# Patient Record
Sex: Female | Born: 1950 | Race: White | Hispanic: No | Marital: Married | Smoking: Never smoker
Health system: Southern US, Community
[De-identification: ages and names within clinical notes are randomized; demographics above are authoritative.]

## PROBLEM LIST (undated history)

## (undated) DIAGNOSIS — Z923 Personal history of irradiation: Secondary | ICD-10-CM

## (undated) DIAGNOSIS — M1990 Unspecified osteoarthritis, unspecified site: Secondary | ICD-10-CM

## (undated) DIAGNOSIS — Z8051 Family history of malignant neoplasm of kidney: Secondary | ICD-10-CM

## (undated) DIAGNOSIS — C50919 Malignant neoplasm of unspecified site of unspecified female breast: Secondary | ICD-10-CM

## (undated) DIAGNOSIS — K429 Umbilical hernia without obstruction or gangrene: Secondary | ICD-10-CM

## (undated) DIAGNOSIS — R112 Nausea with vomiting, unspecified: Secondary | ICD-10-CM

## (undated) DIAGNOSIS — R000 Tachycardia, unspecified: Secondary | ICD-10-CM

## (undated) DIAGNOSIS — R0600 Dyspnea, unspecified: Secondary | ICD-10-CM

## (undated) DIAGNOSIS — Z973 Presence of spectacles and contact lenses: Secondary | ICD-10-CM

## (undated) DIAGNOSIS — G4730 Sleep apnea, unspecified: Secondary | ICD-10-CM

## (undated) DIAGNOSIS — Z803 Family history of malignant neoplasm of breast: Secondary | ICD-10-CM

## (undated) DIAGNOSIS — R569 Unspecified convulsions: Secondary | ICD-10-CM

## (undated) DIAGNOSIS — M549 Dorsalgia, unspecified: Secondary | ICD-10-CM

## (undated) DIAGNOSIS — Z808 Family history of malignant neoplasm of other organs or systems: Secondary | ICD-10-CM

## (undated) DIAGNOSIS — Z9889 Other specified postprocedural states: Secondary | ICD-10-CM

## (undated) DIAGNOSIS — R002 Palpitations: Secondary | ICD-10-CM

## (undated) HISTORY — PX: BREAST EXCISIONAL BIOPSY: SUR124

## (undated) HISTORY — DX: Malignant neoplasm of unspecified site of unspecified female breast: C50.919

## (undated) HISTORY — PX: CHOLECYSTECTOMY: SHX55

## (undated) HISTORY — DX: Family history of malignant neoplasm of breast: Z80.3

## (undated) HISTORY — PX: UPPER GI ENDOSCOPY: SHX6162

## (undated) HISTORY — PX: BRAIN SURGERY: SHX531

## (undated) HISTORY — DX: Family history of malignant neoplasm of other organs or systems: Z80.8

## (undated) HISTORY — PX: COLONOSCOPY: SHX174

## (undated) HISTORY — DX: Family history of malignant neoplasm of kidney: Z80.51

---

## 1989-01-03 HISTORY — PX: ABDOMINAL HYSTERECTOMY: SHX81

## 2001-12-25 ENCOUNTER — Ambulatory Visit (HOSPITAL_COMMUNITY): Admission: RE | Admit: 2001-12-25 | Discharge: 2001-12-25 | Payer: Self-pay | Admitting: Family Medicine

## 2001-12-25 ENCOUNTER — Encounter: Payer: Self-pay | Admitting: Family Medicine

## 2003-03-30 ENCOUNTER — Emergency Department (HOSPITAL_COMMUNITY): Admission: AD | Admit: 2003-03-30 | Discharge: 2003-03-31 | Payer: Self-pay | Admitting: Emergency Medicine

## 2003-05-08 ENCOUNTER — Other Ambulatory Visit: Admission: RE | Admit: 2003-05-08 | Discharge: 2003-05-08 | Payer: Self-pay | Admitting: Gynecology

## 2004-03-02 ENCOUNTER — Ambulatory Visit (HOSPITAL_COMMUNITY): Admission: RE | Admit: 2004-03-02 | Discharge: 2004-03-02 | Payer: Self-pay | Admitting: Family Medicine

## 2004-03-11 ENCOUNTER — Encounter: Admission: RE | Admit: 2004-03-11 | Discharge: 2004-03-11 | Payer: Self-pay

## 2004-03-16 ENCOUNTER — Observation Stay (HOSPITAL_COMMUNITY): Admission: RE | Admit: 2004-03-16 | Discharge: 2004-03-17 | Payer: Self-pay

## 2004-05-21 ENCOUNTER — Other Ambulatory Visit: Admission: RE | Admit: 2004-05-21 | Discharge: 2004-05-21 | Payer: Self-pay | Admitting: Gynecology

## 2004-09-13 ENCOUNTER — Encounter: Admission: RE | Admit: 2004-09-13 | Discharge: 2004-09-13 | Payer: Self-pay

## 2011-01-04 HISTORY — PX: BREAST SURGERY: SHX581

## 2011-09-30 ENCOUNTER — Other Ambulatory Visit: Payer: Self-pay | Admitting: Family Medicine

## 2011-09-30 DIAGNOSIS — E010 Iodine-deficiency related diffuse (endemic) goiter: Secondary | ICD-10-CM

## 2011-10-03 ENCOUNTER — Ambulatory Visit
Admission: RE | Admit: 2011-10-03 | Discharge: 2011-10-03 | Disposition: A | Discharge status: Home / Self Care | Payer: BC Managed Care – PPO | Source: Ambulatory Visit | Attending: Family Medicine | Admitting: Family Medicine

## 2011-10-03 ENCOUNTER — Ambulatory Visit
Admission: RE | Admit: 2011-10-03 | Discharge: 2011-10-03 | Disposition: A | Discharge status: Home / Self Care | Payer: BC Managed Care – PPO | Source: Ambulatory Visit | Attending: Gynecology | Admitting: Gynecology

## 2011-10-03 ENCOUNTER — Other Ambulatory Visit: Payer: Self-pay | Admitting: Gynecology

## 2011-10-03 DIAGNOSIS — Z1231 Encounter for screening mammogram for malignant neoplasm of breast: Secondary | ICD-10-CM

## 2011-10-03 DIAGNOSIS — E010 Iodine-deficiency related diffuse (endemic) goiter: Secondary | ICD-10-CM

## 2011-10-05 ENCOUNTER — Other Ambulatory Visit: Payer: Self-pay | Admitting: Gynecology

## 2011-10-05 DIAGNOSIS — R928 Other abnormal and inconclusive findings on diagnostic imaging of breast: Secondary | ICD-10-CM

## 2011-10-10 ENCOUNTER — Ambulatory Visit
Admission: RE | Admit: 2011-10-10 | Discharge: 2011-10-10 | Disposition: A | Discharge status: Home / Self Care | Payer: BC Managed Care – PPO | Source: Ambulatory Visit | Attending: Gynecology | Admitting: Gynecology

## 2011-10-10 ENCOUNTER — Other Ambulatory Visit: Payer: Self-pay | Admitting: Gynecology

## 2011-10-10 DIAGNOSIS — R928 Other abnormal and inconclusive findings on diagnostic imaging of breast: Secondary | ICD-10-CM

## 2011-10-17 ENCOUNTER — Ambulatory Visit
Admission: RE | Admit: 2011-10-17 | Discharge: 2011-10-17 | Disposition: A | Discharge status: Home / Self Care | Payer: BC Managed Care – PPO | Source: Ambulatory Visit | Attending: Gynecology | Admitting: Gynecology

## 2011-10-17 ENCOUNTER — Other Ambulatory Visit: Payer: Self-pay | Admitting: Gynecology

## 2011-10-17 ENCOUNTER — Other Ambulatory Visit: Payer: Self-pay | Admitting: Diagnostic Radiology

## 2011-10-17 DIAGNOSIS — R928 Other abnormal and inconclusive findings on diagnostic imaging of breast: Secondary | ICD-10-CM

## 2012-08-03 ENCOUNTER — Other Ambulatory Visit: Payer: Self-pay | Admitting: Family Medicine

## 2012-08-03 DIAGNOSIS — M5431 Sciatica, right side: Secondary | ICD-10-CM

## 2012-08-06 ENCOUNTER — Other Ambulatory Visit: Payer: Self-pay | Admitting: Family Medicine

## 2012-08-06 DIAGNOSIS — R921 Mammographic calcification found on diagnostic imaging of breast: Secondary | ICD-10-CM

## 2012-08-07 ENCOUNTER — Ambulatory Visit
Admission: RE | Admit: 2012-08-07 | Discharge: 2012-08-07 | Disposition: A | Discharge status: Home / Self Care | Payer: BC Managed Care – PPO | Source: Ambulatory Visit | Attending: Family Medicine | Admitting: Family Medicine

## 2012-08-07 DIAGNOSIS — M5431 Sciatica, right side: Secondary | ICD-10-CM

## 2012-08-20 ENCOUNTER — Ambulatory Visit
Admission: RE | Admit: 2012-08-20 | Discharge: 2012-08-20 | Disposition: A | Discharge status: Home / Self Care | Payer: BC Managed Care – PPO | Source: Ambulatory Visit | Attending: Family Medicine | Admitting: Family Medicine

## 2012-08-20 ENCOUNTER — Other Ambulatory Visit: Payer: Self-pay | Admitting: Family Medicine

## 2012-08-20 ENCOUNTER — Other Ambulatory Visit: Payer: Self-pay | Admitting: Neurosurgery

## 2012-08-20 DIAGNOSIS — R921 Mammographic calcification found on diagnostic imaging of breast: Secondary | ICD-10-CM

## 2012-08-22 ENCOUNTER — Encounter (HOSPITAL_COMMUNITY): Payer: Self-pay | Admitting: Pharmacy Technician

## 2012-08-23 ENCOUNTER — Encounter (HOSPITAL_COMMUNITY): Payer: Self-pay

## 2012-08-23 ENCOUNTER — Encounter (HOSPITAL_COMMUNITY)
Admission: RE | Admit: 2012-08-23 | Discharge: 2012-08-23 | Disposition: A | Discharge status: Home / Self Care | Payer: BC Managed Care – PPO | Source: Ambulatory Visit | Attending: Neurosurgery | Admitting: Neurosurgery

## 2012-08-23 DIAGNOSIS — Z01812 Encounter for preprocedural laboratory examination: Secondary | ICD-10-CM | POA: Insufficient documentation

## 2012-08-23 HISTORY — DX: Unspecified convulsions: R56.9

## 2012-08-23 LAB — BASIC METABOLIC PANEL
BUN: 12 mg/dL (ref 6–23)
Calcium: 9.4 mg/dL (ref 8.4–10.5)
Chloride: 105 mEq/L (ref 96–112)
Creatinine, Ser: 0.8 mg/dL (ref 0.50–1.10)
GFR calc Af Amer: 88 mL/min — ABNORMAL LOW (ref >90)

## 2012-08-23 LAB — SURGICAL PCR SCREEN: Staphylococcus aureus: NEGATIVE

## 2012-08-23 LAB — CBC
HCT: 39.1 % (ref 36.0–46.0)
MCHC: 35 g/dL (ref 30.0–36.0)
MCV: 90.3 fL (ref 78.0–100.0)
RDW: 13 % (ref 11.5–15.5)

## 2012-08-23 NOTE — Pre-Procedure Instructions (Signed)
CAITLYNNE HARBECK  08/23/2012   Your procedure is scheduled on:  August 27, 2012  Report to [facility] at 5:30 AM.  Call this number if you have problems the morning of surgery: [phone]   Remember:   Do not eat food or drink liquids after midnight.   Take these medicines the morning of surgery with A SIP OF WATER: cyclobenzaprine (FLEXERIL) 10 MG tablet    Do not wear jewelry, make-up or nail polish.  Do not wear lotions, powders, or perfumes. You may wear deodorant.  Do not shave 48 hours prior to surgery. Men may shave face and neck.  Do not bring valuables to the hospital.  [facility] is not responsible for any belongings or valuables.  Contacts, dentures or bridgework may not be worn into surgery.  Leave suitcase in the car. After surgery it may be brought to your room.  For patients admitted to the hospital, checkout time is 11:00 AM the day of  discharge.   Patients discharged the day of surgery will not be allowed to drive  home.  Name and phone number of your driver:   Special Instructions: Shower using CHG 2 nights before surgery and the night before surgery.  If you shower the day of surgery use CHG.  Use special wash - you have one bottle of CHG for all showers.  You should use approximately 1/3 of the bottle for each shower.   Please read over the following fact sheets that you were given: Pain Booklet, Coughing and Deep Breathing and Surgical Site Infection Prevention

## 2012-08-26 MED ORDER — CEFAZOLIN SODIUM-DEXTROSE 2-3 GM-% IV SOLR
2.0000 g | INTRAVENOUS | Status: AC
  Administered 2012-08-27: 2 g via INTRAVENOUS
  Filled 2012-08-26: qty 50

## 2012-08-26 MED ORDER — DEXAMETHASONE SODIUM PHOSPHATE 10 MG/ML IJ SOLN
10.0000 mg | INTRAMUSCULAR | Status: AC
  Administered 2012-08-27: 10 mg via INTRAVENOUS
  Filled 2012-08-26: qty 1

## 2012-08-27 ENCOUNTER — Encounter (HOSPITAL_COMMUNITY): Payer: Self-pay | Admitting: *Deleted

## 2012-08-27 ENCOUNTER — Encounter (HOSPITAL_COMMUNITY): Payer: Self-pay | Admitting: Certified Registered"

## 2012-08-27 ENCOUNTER — Ambulatory Visit (HOSPITAL_COMMUNITY): Payer: BC Managed Care – PPO

## 2012-08-27 ENCOUNTER — Ambulatory Visit (HOSPITAL_COMMUNITY)
Admission: RE | Admit: 2012-08-27 | Discharge: 2012-08-27 | Disposition: A | Discharge status: Home / Self Care | Payer: BC Managed Care – PPO | Source: Ambulatory Visit | Attending: Neurosurgery | Admitting: Neurosurgery

## 2012-08-27 ENCOUNTER — Encounter (HOSPITAL_COMMUNITY)
Admission: RE | Disposition: A | Discharge status: Home / Self Care | Payer: Self-pay | Source: Ambulatory Visit | Attending: Neurosurgery

## 2012-08-27 ENCOUNTER — Ambulatory Visit (HOSPITAL_COMMUNITY): Payer: BC Managed Care – PPO | Admitting: Certified Registered"

## 2012-08-27 DIAGNOSIS — G5790 Unspecified mononeuropathy of unspecified lower limb: Secondary | ICD-10-CM | POA: Insufficient documentation

## 2012-08-27 DIAGNOSIS — M5126 Other intervertebral disc displacement, lumbar region: Secondary | ICD-10-CM | POA: Insufficient documentation

## 2012-08-27 HISTORY — PX: LUMBAR LAMINECTOMY/DECOMPRESSION MICRODISCECTOMY: SHX5026

## 2012-08-27 SURGERY — LUMBAR LAMINECTOMY/DECOMPRESSION MICRODISCECTOMY 1 LEVEL
Anesthesia: General | Site: Back | Laterality: Right | Wound class: Clean

## 2012-08-27 MED ORDER — ALUM & MAG HYDROXIDE-SIMETH 200-200-20 MG/5ML PO SUSP: 30.0000 mL | Freq: Four times a day (QID) | ORAL | Status: DC | PRN

## 2012-08-27 MED ORDER — ONDANSETRON HCL 4 MG/2ML IJ SOLN
INTRAMUSCULAR | Status: DC | PRN
  Administered 2012-08-27: 4 mg via INTRAVENOUS

## 2012-08-27 MED ORDER — HYDROMORPHONE HCL PF 1 MG/ML IJ SOLN: 0.2500 mg | INTRAMUSCULAR | Status: DC | PRN

## 2012-08-27 MED ORDER — NEOSTIGMINE METHYLSULFATE 1 MG/ML IJ SOLN
INTRAMUSCULAR | Status: DC | PRN
  Administered 2012-08-27: 3 mg via INTRAVENOUS

## 2012-08-27 MED ORDER — DOCUSATE SODIUM 100 MG PO CAPS: 100.0000 mg | ORAL_CAPSULE | Freq: Two times a day (BID) | ORAL | Status: DC

## 2012-08-27 MED ORDER — EPHEDRINE SULFATE 50 MG/ML IJ SOLN
INTRAMUSCULAR | Status: DC | PRN
  Administered 2012-08-27: 10 mg via INTRAVENOUS
  Administered 2012-08-27: 5 mg via INTRAVENOUS

## 2012-08-27 MED ORDER — LIDOCAINE-EPINEPHRINE 1 %-1:100000 IJ SOLN
INTRAMUSCULAR | Status: DC | PRN
  Administered 2012-08-27: 5 mL

## 2012-08-27 MED ORDER — OXYCODONE-ACETAMINOPHEN 5-325 MG PO TABS: 1.0000 | ORAL_TABLET | ORAL | Status: DC | PRN

## 2012-08-27 MED ORDER — SODIUM CHLORIDE 0.9 % IJ SOLN: 3.0000 mL | INTRAMUSCULAR | Status: DC | PRN

## 2012-08-27 MED ORDER — ONDANSETRON HCL 4 MG/2ML IJ SOLN: 4.0000 mg | INTRAMUSCULAR | Status: DC | PRN

## 2012-08-27 MED ORDER — CYCLOBENZAPRINE HCL 10 MG PO TABS: 10.0000 mg | ORAL_TABLET | Freq: Three times a day (TID) | ORAL | Status: DC | PRN

## 2012-08-27 MED ORDER — THROMBIN 5000 UNITS EX SOLR
CUTANEOUS | Status: DC | PRN
  Administered 2012-08-27 (×2): 5000 [IU] via TOPICAL

## 2012-08-27 MED ORDER — SODIUM CHLORIDE 0.9 % IR SOLN
Status: DC | PRN
  Administered 2012-08-27: 08:00:00

## 2012-08-27 MED ORDER — LACTATED RINGERS IV SOLN
INTRAVENOUS | Status: DC | PRN
  Administered 2012-08-27 (×2): via INTRAVENOUS

## 2012-08-27 MED ORDER — HYDROMORPHONE HCL PF 1 MG/ML IJ SOLN: 0.5000 mg | INTRAMUSCULAR | Status: DC | PRN

## 2012-08-27 MED ORDER — GLYCOPYRROLATE 0.2 MG/ML IJ SOLN
INTRAMUSCULAR | Status: DC | PRN
  Administered 2012-08-27: 0.4 mg via INTRAVENOUS

## 2012-08-27 MED ORDER — SODIUM CHLORIDE 0.9 % IV SOLN: 250.0000 mL | INTRAVENOUS | Status: DC

## 2012-08-27 MED ORDER — HEMOSTATIC AGENTS (NO CHARGE) OPTIME
TOPICAL | Status: DC | PRN
  Administered 2012-08-27: 1 via TOPICAL

## 2012-08-27 MED ORDER — BUPIVACAINE HCL (PF) 0.25 % IJ SOLN
INTRAMUSCULAR | Status: DC | PRN
  Administered 2012-08-27: 5 mL

## 2012-08-27 MED ORDER — SUFENTANIL CITRATE 50 MCG/ML IV SOLN
INTRAVENOUS | Status: DC | PRN
  Administered 2012-08-27 (×2): 5 ug via INTRAVENOUS
  Administered 2012-08-27: 20 ug via INTRAVENOUS

## 2012-08-27 MED ORDER — ACETAMINOPHEN 325 MG PO TABS: 650.0000 mg | ORAL_TABLET | ORAL | Status: DC | PRN

## 2012-08-27 MED ORDER — MENTHOL 3 MG MT LOZG: 1.0000 | LOZENGE | OROMUCOSAL | Status: DC | PRN

## 2012-08-27 MED ORDER — LIDOCAINE HCL (CARDIAC) 20 MG/ML IV SOLN
INTRAVENOUS | Status: DC | PRN
  Administered 2012-08-27: 100 mg via INTRAVENOUS

## 2012-08-27 MED ORDER — PHENYLEPHRINE HCL 10 MG/ML IJ SOLN
INTRAMUSCULAR | Status: DC | PRN
  Administered 2012-08-27: 80 ug via INTRAVENOUS
  Administered 2012-08-27: 40 ug via INTRAVENOUS
  Administered 2012-08-27: 80 ug via INTRAVENOUS
  Administered 2012-08-27: 40 ug via INTRAVENOUS

## 2012-08-27 MED ORDER — PHENOL 1.4 % MT LIQD: 1.0000 | OROMUCOSAL | Status: DC | PRN

## 2012-08-27 MED ORDER — TRAMADOL HCL 50 MG PO TABS: 50.0000 mg | ORAL_TABLET | Freq: Four times a day (QID) | ORAL | Status: DC | PRN

## 2012-08-27 MED ORDER — ACETAMINOPHEN 650 MG RE SUPP: 650.0000 mg | RECTAL | Status: DC | PRN

## 2012-08-27 MED ORDER — ROCURONIUM BROMIDE 100 MG/10ML IV SOLN
INTRAVENOUS | Status: DC | PRN
  Administered 2012-08-27: 5 mg via INTRAVENOUS
  Administered 2012-08-27: 40 mg via INTRAVENOUS

## 2012-08-27 MED ORDER — MIDAZOLAM HCL 5 MG/5ML IJ SOLN
INTRAMUSCULAR | Status: DC | PRN
  Administered 2012-08-27: 2 mg via INTRAVENOUS

## 2012-08-27 MED ORDER — PROMETHAZINE HCL 25 MG/ML IJ SOLN: 6.2500 mg | INTRAMUSCULAR | Status: DC | PRN

## 2012-08-27 MED ORDER — SODIUM CHLORIDE 0.9 % IJ SOLN: 3.0000 mL | Freq: Two times a day (BID) | INTRAMUSCULAR | Status: DC

## 2012-08-27 MED ORDER — CEFAZOLIN SODIUM 1-5 GM-% IV SOLN
1.0000 g | Freq: Three times a day (TID) | INTRAVENOUS | Status: DC
  Administered 2012-08-27: 1 g via INTRAVENOUS
  Filled 2012-08-27 (×2): qty 50

## 2012-08-27 MED ORDER — PROPOFOL 10 MG/ML IV BOLUS
INTRAVENOUS | Status: DC | PRN
  Administered 2012-08-27: 40 mg via INTRAVENOUS
  Administered 2012-08-27: 160 mg via INTRAVENOUS

## 2012-08-27 MED ORDER — ARTIFICIAL TEARS OP OINT
TOPICAL_OINTMENT | OPHTHALMIC | Status: DC | PRN
  Administered 2012-08-27: 1 via OPHTHALMIC

## 2012-08-27 SURGICAL SUPPLY — 66 items
ADH SKN CLS APL DERMABOND .7 (GAUZE/BANDAGES/DRESSINGS) ×1
ADH SKN CLS LQ APL DERMABOND (GAUZE/BANDAGES/DRESSINGS) ×1
APL SKNCLS STERI-STRIP NONHPOA (GAUZE/BANDAGES/DRESSINGS) ×1
BAG DECANTER FOR FLEXI CONT (MISCELLANEOUS) ×2 IMPLANT
BENZOIN TINCTURE PRP APPL 2/3 (GAUZE/BANDAGES/DRESSINGS) ×2 IMPLANT
BLADE SURG 11 STRL SS (BLADE) ×2 IMPLANT
BLADE SURG ROTATE 9660 (MISCELLANEOUS) IMPLANT
BRUSH SCRUB EZ PLAIN DRY (MISCELLANEOUS) ×2 IMPLANT
BUR MATCHSTICK NEURO 3.0 LAGG (BURR) ×2 IMPLANT
BUR PRECISION FLUTE 6.0 (BURR) ×2 IMPLANT
CANISTER SUCTION 2500CC (MISCELLANEOUS) ×2 IMPLANT
CLOTH BEACON ORANGE TIMEOUT ST (SAFETY) ×2 IMPLANT
CONT SPEC 4OZ CLIKSEAL STRL BL (MISCELLANEOUS) ×2 IMPLANT
DECANTER SPIKE VIAL GLASS SM (MISCELLANEOUS) ×2 IMPLANT
DERMABOND ADHESIVE PROPEN (GAUZE/BANDAGES/DRESSINGS) ×1
DERMABOND ADVANCED (GAUZE/BANDAGES/DRESSINGS) ×1
DERMABOND ADVANCED .7 DNX12 (GAUZE/BANDAGES/DRESSINGS) ×1 IMPLANT
DERMABOND ADVANCED .7 DNX6 (GAUZE/BANDAGES/DRESSINGS) IMPLANT
DRAPE LAPAROTOMY 100X72X124 (DRAPES) ×2 IMPLANT
DRAPE MICROSCOPE ZEISS OPMI (DRAPES) ×2 IMPLANT
DRAPE POUCH INSTRU U-SHP 10X18 (DRAPES) ×2 IMPLANT
DRAPE PROXIMA HALF (DRAPES) ×1 IMPLANT
DRAPE SURG 17X23 STRL (DRAPES) ×2 IMPLANT
DRSG OPSITE 4X5.5 SM (GAUZE/BANDAGES/DRESSINGS) ×2 IMPLANT
DURAPREP 26ML APPLICATOR (WOUND CARE) ×2 IMPLANT
ELECT BLADE 4.0 EZ CLEAN MEGAD (MISCELLANEOUS) ×2
ELECT REM PT RETURN 9FT ADLT (ELECTROSURGICAL) ×2
ELECTRODE BLDE 4.0 EZ CLN MEGD (MISCELLANEOUS) IMPLANT
ELECTRODE REM PT RTRN 9FT ADLT (ELECTROSURGICAL) ×1 IMPLANT
GAUZE SPONGE 4X4 16PLY XRAY LF (GAUZE/BANDAGES/DRESSINGS) IMPLANT
GLOVE BIO SURGEON STRL SZ8 (GLOVE) ×2 IMPLANT
GLOVE BIOGEL PI IND STRL 7.0 (GLOVE) IMPLANT
GLOVE BIOGEL PI IND STRL 8 (GLOVE) IMPLANT
GLOVE BIOGEL PI INDICATOR 7.0 (GLOVE) ×1
GLOVE BIOGEL PI INDICATOR 8 (GLOVE) ×1
GLOVE ECLIPSE 7.5 STRL STRAW (GLOVE) ×1 IMPLANT
GLOVE EXAM NITRILE LRG STRL (GLOVE) ×1 IMPLANT
GLOVE EXAM NITRILE MD LF STRL (GLOVE) IMPLANT
GLOVE EXAM NITRILE XL STR (GLOVE) IMPLANT
GLOVE EXAM NITRILE XS STR PU (GLOVE) IMPLANT
GLOVE INDICATOR 8.5 STRL (GLOVE) ×2 IMPLANT
GLOVE SS BIOGEL STRL SZ 6.5 (GLOVE) IMPLANT
GLOVE SUPERSENSE BIOGEL SZ 6.5 (GLOVE) ×2
GOWN BRE IMP SLV AUR LG STRL (GOWN DISPOSABLE) ×2 IMPLANT
GOWN BRE IMP SLV AUR XL STRL (GOWN DISPOSABLE) ×4 IMPLANT
GOWN STRL REIN 2XL LVL4 (GOWN DISPOSABLE) IMPLANT
KIT BASIN OR (CUSTOM PROCEDURE TRAY) ×2 IMPLANT
KIT ROOM TURNOVER OR (KITS) ×2 IMPLANT
NDL SPNL 22GX3.5 QUINCKE BK (NEEDLE) ×1 IMPLANT
NEEDLE HYPO 22GX1.5 SAFETY (NEEDLE) ×2 IMPLANT
NEEDLE SPNL 22GX3.5 QUINCKE BK (NEEDLE) ×2 IMPLANT
NS IRRIG 1000ML POUR BTL (IV SOLUTION) ×2 IMPLANT
PACK LAMINECTOMY NEURO (CUSTOM PROCEDURE TRAY) ×2 IMPLANT
RUBBERBAND STERILE (MISCELLANEOUS) ×4 IMPLANT
SPONGE GAUZE 4X4 12PLY (GAUZE/BANDAGES/DRESSINGS) ×2 IMPLANT
SPONGE SURGIFOAM ABS GEL SZ50 (HEMOSTASIS) ×2 IMPLANT
STRIP CLOSURE SKIN 1/2X4 (GAUZE/BANDAGES/DRESSINGS) ×2 IMPLANT
SUT VIC AB 0 CT1 18XCR BRD8 (SUTURE) ×1 IMPLANT
SUT VIC AB 0 CT1 8-18 (SUTURE) ×2
SUT VIC AB 2-0 CT1 18 (SUTURE) ×2 IMPLANT
SUT VICRYL 4-0 PS2 18IN ABS (SUTURE) ×2 IMPLANT
SYR 20ML ECCENTRIC (SYRINGE) ×2 IMPLANT
TAPE CLOTH SURG 4X10 WHT LF (GAUZE/BANDAGES/DRESSINGS) ×1 IMPLANT
TOWEL OR 17X24 6PK STRL BLUE (TOWEL DISPOSABLE) ×2 IMPLANT
TOWEL OR 17X26 10 PK STRL BLUE (TOWEL DISPOSABLE) ×2 IMPLANT
WATER STERILE IRR 1000ML POUR (IV SOLUTION) ×2 IMPLANT

## 2012-08-27 NOTE — Addendum Note (Signed)
Addendum created 08/27/12 1529 by Lovie Chol, CRNA   Modules edited: Anesthesia Flowsheet

## 2012-08-27 NOTE — Anesthesia Procedure Notes (Signed)
Procedure Name: Intubation Date/Time: 08/27/2012 7:30 AM Performed by: Lovie Chol Pre-anesthesia Checklist: Patient identified, Emergency Drugs available, Suction available and Patient being monitored Patient Re-evaluated:Patient Re-evaluated prior to inductionOxygen Delivery Method: Circle system utilized Preoxygenation: Pre-oxygenation with 100% oxygen Intubation Type: IV induction Ventilation: Mask ventilation without difficulty Laryngoscope Size: Miller and 2 Grade View: Grade II Tube type: Oral Number of attempts: 1 Airway Equipment and Method: Stylet Placement Confirmation: ETT inserted through vocal cords under direct vision,  positive ETCO2,  CO2 detector and breath sounds checked- equal and bilateral Secured at: 21 cm Tube secured with: Tape Dental Injury: Teeth and Oropharynx as per pre-operative assessment

## 2012-08-27 NOTE — Discharge Summary (Signed)
  Physician Discharge Summary  Patient ID: Virginia Lindsey MRN: [id number] DOB/AGE: 1950/02/04 62 y.o.  Admit date: 08/27/2012 Discharge date: 08/27/2012  Admission Diagnoses: Herniated nucleus pulposus L4-5 extraforaminal right  Discharge Diagnoses: Same Active Problems:   * No active hospital problems. *   Discharged Condition: good  Hospital Course: Patient is been hospital underwent an external discectomy at L4-5 in the right postop patient did fairly well had some anterior quad pain but was otherwise doing well and neurologically nonfocal complete resolution her preoperative radicular symptoms was angling and voiding spontaneously was able be discharged home later on hospital day 1 the  Consults: Significant Diagnostic Studies: Treatments: Extra foraminal discectomy L4-5 right Discharge Exam: Blood pressure 139/72, pulse 97, temperature 97.6 F (36.4 C), temperature source Oral, resp. rate 16, SpO2 96.00%. Strength out of 5 wound clean and dry  Disposition: Home     Medication List         cyclobenzaprine 10 MG tablet  Commonly known as:  FLEXERIL  Take 10 mg by mouth 3 (three) times daily as needed for muscle spasms.     cyclobenzaprine 10 MG tablet  Commonly known as:  FLEXERIL  Take 1 tablet (10 mg total) by mouth 3 (three) times daily as needed for muscle spasms.     traMADol 50 MG tablet  Commonly known as:  ULTRAM  Take 1 tablet (50 mg total) by mouth every 6 (six) hours as needed for pain.           Follow-up Information   Follow up with [facility] P, MD.   Specialty:  Neurosurgery   Contact information:   [address] [place] [address] [phone]       Signed: Cailynn Bodnar P 08/27/2012, 5:20 PM

## 2012-08-27 NOTE — Transfer of Care (Signed)
Immediate Anesthesia Transfer of Care Note  Patient: Virginia Lindsey  Procedure(s) Performed: Procedure(s): RIGHT  LUMBAR FOUR FIVE LAMINECTOMY/DECOMPRESSION MICRODISCECTOMY 1 LEVEL (Right)  Patient Location: PACU  Anesthesia Type:General  Level of Consciousness: awake, alert , oriented and patient cooperative  Airway & Oxygen Therapy: Patient Spontanous Breathing and Patient connected to nasal cannula oxygen  Post-op Assessment: Report given to PACU RN and Post -op Vital signs reviewed and stable  Post vital signs: Reviewed and stable  Complications: No apparent anesthesia complications

## 2012-08-27 NOTE — Progress Notes (Signed)
Pt given D/C instructions with Rx, verbal understanding given. Pt D/C'd home via wheelchair @ 1845 per MD order. Rema Fendt, RN

## 2012-08-27 NOTE — Anesthesia Preprocedure Evaluation (Addendum)
Anesthesia Evaluation  Patient identified by MRN, date of birth, ID band Patient awake    Reviewed: Allergy & Precautions, H&P , NPO status , Patient's Chart, lab work & pertinent test results  History of Anesthesia Complications Negative for: history of anesthetic complications  Airway Mallampati: II TM Distance: >3 FB Neck ROM: Full    Dental  (+) Teeth Intact, Caps, Dental Advisory Given and Chipped,    Pulmonary neg pulmonary ROS,  breath sounds clear to auscultation  Pulmonary exam normal       Cardiovascular negative cardio ROS  Rhythm:Regular Rate:Normal     Neuro/Psych Seizures -, Well Controlled,  negative psych ROS   GI/Hepatic negative GI ROS, Neg liver ROS,   Endo/Other  negative endocrine ROS  Renal/GU negative Renal ROS  negative genitourinary   Musculoskeletal   Abdominal   Peds  Hematology negative hematology ROS (+)   Anesthesia Other Findings   Reproductive/Obstetrics negative OB ROS                         Anesthesia Physical Anesthesia Plan  ASA: II  Anesthesia Plan: General   Post-op Pain Management:    Induction: Intravenous  Airway Management Planned: Oral ETT  Additional Equipment:   Intra-op Plan:   Post-operative Plan: Extubation in OR  Informed Consent: I have reviewed the patients History and Physical, chart, labs and discussed the procedure including the risks, benefits and alternatives for the proposed anesthesia with the patient or authorized representative who has indicated his/her understanding and acceptance.   Dental advisory given  Plan Discussed with: CRNA, Anesthesiologist and Surgeon  Anesthesia Plan Comments:        Anesthesia Quick Evaluation

## 2012-08-27 NOTE — Anesthesia Postprocedure Evaluation (Signed)
Anesthesia Post Note  Patient: Virginia Lindsey  Procedure(s) Performed: Procedure(s) (LRB): RIGHT  LUMBAR FOUR FIVE LAMINECTOMY/DECOMPRESSION MICRODISCECTOMY 1 LEVEL (Right)  Anesthesia type: general  Patient location: PACU  Post pain: Pain level controlled  Post assessment: Patient's Cardiovascular Status Stable  Last Vitals:  Filed Vitals:   08/27/12 0944  BP:   Pulse: 53  Temp: 36.4 C  Resp: 13    Post vital signs: Reviewed and stable  Level of consciousness: sedated  Complications: No apparent anesthesia complications

## 2012-08-27 NOTE — H&P (Signed)
Virginia Lindsey is an 62 y.o. female.   Chief Complaint: Back and right leg pain HPI: Patient is a very pleasant 64-3 months back and right leg pain that had been getting progressively worse over last weeks and months she also developed weakness on dorsiflexion of her right foot difficulty walking on her heels. Workup with MRI scan showed a large disc herniation extraforaminal he on the right at L4-5 and due to patient's dorsiflexion weakness partial footdrop progression of clinical syndrome and imaging findings I recommended a extraforaminal discectomy at L4-5 on the right. As of late her pain is starting get some better but the weakness has persisted.  Past Medical History  Diagnosis Date  . Seizures     last seizure 2004  3 total 10 yrs apart    Past Surgical History  Procedure Laterality Date  . Cholecystectomy    . Abdominal hysterectomy      partial hysterectomy    History reviewed. No pertinent family history. Social History:  reports that she has never smoked. She does not have any smokeless tobacco history on file. She reports that she does not drink alcohol or use illicit drugs.  Allergies:  Allergies  Allergen Reactions  . Hydrocodone Nausea And Vomiting    Also lightheaded and disoriented  . Codeine Nausea And Vomiting    Medications Prior to Admission  Medication Sig Dispense Refill  . cyclobenzaprine (FLEXERIL) 10 MG tablet Take 10 mg by mouth 3 (three) times daily as needed for muscle spasms.        No results found for this or any previous visit (from the past 48 hour(s)). No results found.  Review of Systems  Constitutional: Negative.   HENT: Negative.   Eyes: Negative.   Respiratory: Negative.   Cardiovascular: Negative.   Gastrointestinal: Negative.   Genitourinary: Negative.   Musculoskeletal: Positive for myalgias and back pain.  Skin: Negative.   Neurological: Positive for tingling and sensory change.  Psychiatric/Behavioral: Negative.      Blood pressure 174/85, pulse 78, temperature 98.7 F (37.1 C), temperature source Oral, resp. rate 18, SpO2 97.00%. Physical Exam  Constitutional: She is oriented to person, place, and time. She appears well-developed and well-nourished.  HENT:  Head: Normocephalic.  Eyes: Pupils are equal, round, and reactive to light.  Neck: Normal range of motion. Neck supple.  Respiratory: Effort normal.  GI: Soft.  Neurological: She is alert and oriented to person, place, and time. She has normal strength. GCS eye subscore is 4. GCS verbal subscore is 5. GCS motor subscore is 6.  Reflex Scores:      Patellar reflexes are 0 on the right side and 0 on the left side.      Achilles reflexes are 0 on the right side and 0 on the left side. Left lower extremity has 5 out of 5 strength in her iliopsoas, quads, hamstrings, gastrocs, anterior tibialis, and EHL. Right lower extremity has some weakness in dorsiflexion at about 4-4+ out of 5 otherwise she is 5 out of 5  Skin: Skin is warm.     Assessment/Plan 62 yo female presents for an extraforaminal discectomy on the right L4-5  Virginia Lindsey P 08/27/2012, 7:19 AM

## 2012-08-27 NOTE — Op Note (Signed)
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  EBL: Minimal  History of present illness: Patient is a very pleasant 62 year old female has had back and right leg pain rating down out of 5 her shin with intermittent numbness and weakness in dorsiflexion of her right foot. Workup revealed a large disc herniation in the extra foraminal space causing severe compression of the right L4 nerve root and due to patient's clinical exam consistent with dorsiflexion weakness and failure conservative treatment and as well as imaging findings I recommended extra foraminal discectomy at L4-5 in the right I extensively went over the risks and benefits of the operation with her as well as perioperative course and expectations of outcome alternatives surgery and she understood and agreed to proceed forward.  Operative procedure: Patient brought into the or was induced under general anesthesia positioned prone the Wilson frame her back was prepped and draped in routine sterile fashion. Preoperative to localize the L4 pedicle and the lateral aspect of the pars intra-or facet the three fourths several fibers to allow a high-speed drill and the microscope illumination the extradural space was identified and the markedly spondylitic from marked facet arthropathy and a 34 facet extending downward below the L4 pedicle. This was all drilled away and removed piecemeal fashion with a 2 mm Kerrison punch. Under MAC supination intertransverse ligament was divided and removed in piecemeal fashion exposing the L4 nerve root. The L4 foramen was opened up working underneath and inferior the L4 nerve root a very large disc herniation was seen just still and  partially contained ligament ligament was incised with lumbar scalpel and several large free fragment was removed at this point the L4 nerve root markedly decompressed and resumed its normal anatomic configuration. Initial exploration in the external space confirm no further fragments the spaces cleanout pituitary rongeurs and the internal space was further explore with coronary dilator and angled hockey-stick. After adequate decompression and foraminotomies been achieved the wound scope see her get meticulous hemostasis was maintained Gelfoam was laid up the dura the muscle fascia proximal in layers with after Vicryl the skin was closed running 4 subcuticular benzo and Steri-Strips were applied patient recovered in stable condition. At the case all needle counts sponge counts were correct.

## 2012-08-28 ENCOUNTER — Encounter (HOSPITAL_COMMUNITY): Payer: Self-pay | Admitting: Neurosurgery

## 2012-10-18 ENCOUNTER — Encounter (INDEPENDENT_AMBULATORY_CARE_PROVIDER_SITE_OTHER): Payer: Self-pay

## 2012-10-18 ENCOUNTER — Encounter (INDEPENDENT_AMBULATORY_CARE_PROVIDER_SITE_OTHER): Payer: Self-pay | Admitting: General Surgery

## 2012-10-18 ENCOUNTER — Ambulatory Visit (INDEPENDENT_AMBULATORY_CARE_PROVIDER_SITE_OTHER): Payer: BC Managed Care – PPO | Admitting: General Surgery

## 2012-10-18 VITALS — BP 142/90 | HR 72 | Temp 99.1°F | Resp 15 | Ht 64.0 in | Wt 216.6 lb

## 2012-10-18 DIAGNOSIS — E041 Nontoxic single thyroid nodule: Secondary | ICD-10-CM

## 2012-10-18 NOTE — Patient Instructions (Signed)
Thyroid Cyst  The thyroid gland is a butterfly-shaped gland in the middle of the neck, located just below the voice box. It makes thyroid hormone. Thyroid hormone has an effect on nearly all tissues of your body by regulating your metabolism. Metabolism is the breakdown and use of food that you eat or energy that is stored in your body. Your metabolism affects your heart rate, blood pressure, body temperature, and weight.   Thyroid cysts are enlarged fluid filled regions of the thyroid gland. These cysts range in size and may expand and enlarge suddenly. Rapidly expanding cysts may cause pain, difficulty swallowing, and rarely, difficulty breathing. Most cysts of the thyroid are not cancerous (benign).  SYMPTOMS  Bleeding may occur within the cyst. If the bleeding is severe, the cyst may get larger and produce problems in the neck, including swelling that may produce pain and difficultly swallowing. If the vocal cords are compressed, hoarseness may occur. If the windpipe is compressed, you may have difficulty breathing.  DIAGNOSIS   A thyroid cyst is diagnosed through physical exam. The diagnosis can be confirmed by an ultrasound exam of the neck. This creates a picture by bouncing sound waves off the thyroid gland. Sometimes the cysts are drained using a fine needle. The fluid is then sent to the lab where it can be examined. This is done to see if any cells in the fluid are cancerous. If they are found to be cancerous, you will need further treatment.   TREATMENT   If the fluid in your neck does not show evidence of cancer, your caregiver may just want to monitor you with yearly ultrasound exams. Sometimes cysts need to be removed surgically.  Document Released: 11/13/2003 Document Revised: 03/14/2011 Document Reviewed: 02/25/2010  Exit[employer]® Patient Information ©2014 [employer].

## 2012-10-18 NOTE — Progress Notes (Signed)
Patient ID: Virginia Lindsey, female   DOB: [birth year], 62 y.o.   MRN: [id number]  Chief Complaint  Patient presents with  . New Evaluation    eval thyroid    HPI Virginia Lindsey is a 62 y.o. female.   HPI 62 yo WF referred byDr. Marcy Siren for evaluation of thyromegaly. Patient states that the right side of her thyroid has been swollen for over a year. She initially sought medical evaluation about a year ago. She states she was told that area was a cyst at that time and to keep an eye on it. She states over the past several months she feels that the swelling has now shifted more to the midline. She states that the swelling is to be more to the side now it's more in front. She also states that she has neck discomfort when she is looking down toward her chest and when she is rolling her hair. She also states that occasionally she will have difficulty swallowing. She states at times it feels like the food goes down the wrong hole. She states that  Sometimes about an hour after eating she will belch and a small piece of food will come up. She denies any Pain with swallowing. She denies any weight loss. She denies any prior radiation. She denies any family history of any type of malignancy. She denies any voice change. She denies any palpitations. She does have a bowel movement every other day. She denies any heat or cold intolerance. She is getting ready to have a breast lesion evaluated by Dr. Dwain Sarna tomorrow.  Past Medical History  Diagnosis Date  . Seizures     last seizure 2004  3 total 10 yrs apart    Past Surgical History  Procedure Laterality Date  . Cholecystectomy    . Abdominal hysterectomy      partial hysterectomy  . Lumbar laminectomy/decompression microdiscectomy Right 08/27/2012    Procedure: RIGHT  LUMBAR FOUR FIVE LAMINECTOMY/DECOMPRESSION MICRODISCECTOMY 1 LEVEL;  Surgeon: Mariam Dollar, MD;  Location: [facility];  Service: Neurosurgery;  Laterality: Right;  . Breast surgery        biopsy    Family History  Problem Relation Age of Onset  . Asthma Mother   . Cancer Mother     renal cell carcinoma  . Cancer Father     melanoma on face  . Diabetes Maternal Grandmother     Social History History  Substance Use Topics  . Smoking status: Never Smoker   . Smokeless tobacco: Never Used  . Alcohol Use: No    Allergies  Allergen Reactions  . Hydrocodone Nausea And Vomiting    Also lightheaded and disoriented  . Codeine Nausea And Vomiting    No current outpatient prescriptions on file.   No current facility-administered medications for this visit.    Review of Systems Review of Systems  Constitutional: Negative for fever, activity change, appetite change and unexpected weight change.  HENT: Positive for trouble swallowing. Negative for nosebleeds and voice change.   Eyes: Negative for photophobia and visual disturbance.  Respiratory: Negative for chest tightness and shortness of breath.        Mild OSA - no cpap, sleeps on elevated pillows  Cardiovascular: Negative for chest pain and leg swelling.       Denies CP, SOB, orthopnea, PND, DOE  Gastrointestinal: Positive for constipation. Negative for nausea, vomiting, abdominal pain and diarrhea.  Genitourinary: Negative for dysuria and difficulty urinating.  Musculoskeletal:  Negative for arthralgias.  Skin: Negative for pallor and rash.  Neurological: Negative for dizziness, seizures, facial asymmetry and numbness.       Denies TIA and amaurosis fugax   Hematological: Negative for adenopathy. Does not bruise/bleed easily.  Psychiatric/Behavioral: Negative for behavioral problems and agitation.    Blood pressure 142/90, pulse 72, temperature 99.1 F (37.3 C), temperature source Temporal, resp. rate 15, height 5\' 4"  (1.626 m), weight 216 lb 9.6 oz (98.249 kg).  Physical Exam Physical Exam  Vitals reviewed. Constitutional: She is oriented to person, place, and time. She appears well-developed and  well-nourished. No distress.  HENT:  Head: Normocephalic and atraumatic.  Right Ear: External ear normal.  Left Ear: External ear normal.  Eyes: Conjunctivae are normal. No scleral icterus.  Neck: Normal range of motion and phonation normal. Neck supple. No JVD present. No tracheal deviation present. Thyromegaly present.    Rt sided thyroid fullness  Cardiovascular: Normal rate and normal heart sounds.   Pulmonary/Chest: Effort normal and breath sounds normal. No stridor. No respiratory distress. She has no wheezes.  Abdominal: Soft. She exhibits no distension.  Musculoskeletal: She exhibits no edema and no tenderness.  Lymphadenopathy:    She has no cervical adenopathy.  Neurological: She is alert and oriented to person, place, and time. She exhibits normal muscle tone.  Skin: Skin is warm and dry. No rash noted. She is not diaphoretic. No erythema.  Psychiatric: She has a normal mood and affect. Her behavior is normal. Judgment and thought content normal.    Data Reviewed Dr Chase Caller note 09/2011 Neck u/s 09/2011 TSH from one year ago showed a level I.53, BMET that was also normal  Assessment    Large right thyroid cyst Left thyroid nodule     Plan    We discussed the thyroid. The patient was given educational material. We discussed thyroid nodules and thyroid cysts. In review of her thyroid cyst it was a very large cyst measuring 7.3 x 4.6 x 4.8 which was consistent with a simple cyst. There is no solid component. There is no mention of microcalcifications. She had a tiny hypoechoic area of nodularity in the left lobe measuring 0.6 cm with a benign appearance.  I explained that her cyst appears simple without any concerning radiographic features. However it has been one year since her last ultrasound. I recommended repeat ultrasound. We also need to recheck her TSH level. If her cyst on the right side still appears simple we discussed management options. We discussed  ultrasound-guided aspiration versus thyroid lobectomy. We discussed the pros and cons of each. My recommendation was to first start with ultrasound guided cyst aspiration. We discussed the possibility of recurrence with aspiration. We will ask radiology to send fluid for cytology. We will also look at the nodule in the left. It was small last year And also appeared simple and not at the point where it meets criteria for FNA biopsy.   We will arrange followup after her procedure.  Mary Sella. Andrey Campanile, MD, FACS General, Bariatric, & Minimally Invasive Surgery [facility], [place]        [facility] M 10/18/2012, 9:23 AM

## 2012-10-19 ENCOUNTER — Telehealth (INDEPENDENT_AMBULATORY_CARE_PROVIDER_SITE_OTHER): Payer: Self-pay

## 2012-10-19 ENCOUNTER — Encounter (INDEPENDENT_AMBULATORY_CARE_PROVIDER_SITE_OTHER): Payer: Self-pay

## 2012-10-19 ENCOUNTER — Ambulatory Visit (INDEPENDENT_AMBULATORY_CARE_PROVIDER_SITE_OTHER): Payer: BC Managed Care – PPO | Admitting: General Surgery

## 2012-10-19 ENCOUNTER — Encounter (INDEPENDENT_AMBULATORY_CARE_PROVIDER_SITE_OTHER): Payer: Self-pay | Admitting: General Surgery

## 2012-10-19 VITALS — BP 138/78 | HR 70 | Temp 97.5°F | Resp 14 | Ht 64.0 in | Wt 216.8 lb

## 2012-10-19 DIAGNOSIS — N6099 Unspecified benign mammary dysplasia of unspecified breast: Secondary | ICD-10-CM

## 2012-10-19 DIAGNOSIS — N6089 Other benign mammary dysplasias of unspecified breast: Secondary | ICD-10-CM

## 2012-10-19 NOTE — Progress Notes (Signed)
Patient ID: Virginia Lindsey, female   DOB: 08/05/1950, 62 y.o.   MRN: [id number]  Chief Complaint  Patient presents with  . New Evaluation    new br ca    HPI Donnalee A Hackworth is a 62 y.o. female.  Referred by Dr Jeff Hu HPI This is a 62-year-old female who underwent evaluation with the mammogram in 2013 where she had microcalcifications noted specifically on the left side that were new. This underwent a biopsy in October of 2013 which showed atypical ductal hyperplasia with calcifications. She chose at that point not to undergo surgical evaluation and just be followed. She had a repeat mammogram which still shows that these calcifications are present. She has applied for some insurance and may have been denied at this point apparently. She comes in today to discuss excision. She is now wanting to undergo this procedure. She reports no complaints with either breast. She has no history of any breast disease. She has no family history of breast or ovarian cancer. Past Medical History  Diagnosis Date  . Seizures     last seizure 2004  3 total 10 yrs apart    Past Surgical History  Procedure Laterality Date  . Cholecystectomy    . Abdominal hysterectomy      partial hysterectomy  . Lumbar laminectomy/decompression microdiscectomy Right 08/27/2012    Procedure: RIGHT  LUMBAR FOUR FIVE LAMINECTOMY/DECOMPRESSION MICRODISCECTOMY 1 LEVEL;  Surgeon: Gary P Cram, MD;  Location: [facility];  Service: Neurosurgery;  Laterality: Right;  . Breast surgery      biopsy    Family History  Problem Relation Age of Onset  . Asthma Mother   . Cancer Mother     renal cell carcinoma  . Cancer Father     melanoma on face  . Diabetes Maternal Grandmother     Social History History  Substance Use Topics  . Smoking status: Never Smoker   . Smokeless tobacco: Never Used  . Alcohol Use: No    Allergies  Allergen Reactions  . Hydrocodone Nausea And Vomiting    Also lightheaded and disoriented  .  Codeine Nausea And Vomiting    No current outpatient prescriptions on file.   No current facility-administered medications for this visit.    Review of Systems Review of Systems  Constitutional: Negative for fever, chills and unexpected weight change.  HENT: Negative for congestion, hearing loss, sore throat, trouble swallowing and voice change.   Eyes: Negative for visual disturbance.  Respiratory: Negative for cough and wheezing.   Cardiovascular: Negative for chest pain, palpitations and leg swelling.  Gastrointestinal: Negative for nausea, vomiting, abdominal pain, diarrhea, constipation, blood in stool, abdominal distention and anal bleeding.  Genitourinary: Negative for hematuria, vaginal bleeding and difficulty urinating.  Musculoskeletal: Negative for arthralgias.  Skin: Negative for rash and wound.  Neurological: Negative for seizures, syncope and headaches.  Hematological: Negative for adenopathy. Does not bruise/bleed easily.  Psychiatric/Behavioral: Negative for confusion.    Blood pressure 138/78, pulse 70, temperature 97.5 F (36.4 C), temperature source Temporal, resp. rate 14, height 5' 4" (1.626 m), weight 216 lb 12.8 oz (98.34 kg).  Physical Exam Physical Exam  Vitals reviewed. Constitutional: She appears well-developed and well-nourished.  Neck: Neck supple. Thyromegaly present.  Cardiovascular: Normal rate, regular rhythm and normal heart sounds.   Pulmonary/Chest: Effort normal and breath sounds normal. She has no wheezes. She has no rales. Right breast exhibits no inverted nipple, no mass, no nipple discharge, no skin change and   no tenderness. Left breast exhibits no inverted nipple, no mass, no nipple discharge, no skin change and no tenderness.  Lymphadenopathy:    She has no cervical adenopathy.    She has no axillary adenopathy.       Right: No supraclavicular adenopathy present.       Left: No supraclavicular adenopathy present.    Data  Reviewed DIGITAL DIAGNOSTIC BILATERAL MAMMOGRAM WITH CAD  Comparison: Previous examinations, the most recent dated  10/17/2011, 10/10/2011 and 10/04/2011.  Findings:  ACR Breast Density Category b: There are scattered areas of  fibroglandular density.  The previously evaluated right breast probably benign  calcifications are unchanged. There are only a few tiny residual  microcalcifications in the left breast at the location of the  marker clip placed at the time of stereotactic guided core needle  biopsy on 10/17/2011. No new findings suspicious for malignancy in  either breast.  Mammographic images were processed with CAD.  IMPRESSION:  1. Left breast atypical ductal hyperplasia diagnosed on  stereotactic guided core needle biopsy on 10/17/2011. It was  previously recommended that this be excised. However, the patient  elected to not have this area excised. Again, surgical excision is  recommended due to the possibility of coexistent ductal carcinoma  in situ . This was discussed at length with the patient for  approximately 30 minutes today. She says that she will think about  having the surgical excision done but is not ready to commit to  that today.  2. Stable right breast probably benign calcifications. A follow-  up right diagnostic mammogram is recommended in 6 months.  RECOMMENDATION:  1. Left breast needle localization and surgical excision of the  area of previously biopsied atypical ductal hyperplasia due to the  risk of coexistent ductal carcinoma in situ.  2. Right breast diagnostic mammogram in 6 months.  I have discussed the findings and recommendations with the patient.  Results were also provided in writing at the conclusion of the  visit. If applicable, a reminder letter will be sent to the  patient regarding her next appointment.    Assessment    Left breast ADH     Plan    Left breast wire guided excisional biopsy     We discussed the indication for  excision to be to ensure that there are no cancer cells present in the specimen. We discussed that ADH already puts her at high risk for developing breast cancer and that at a minimum I will refer her to see Dr. Khan in the risk reduction clinic. We discussed the performance of the procedure with wire guidance. We discussed the risk and benefits associated with that. I would await to see if she needs any surgery done with her thyroid and then we'll plan on scheduling her following that evaluation by my partner Dr. Wilson.   Audine Mangione 10/19/2012, 9:54 AM    

## 2012-10-19 NOTE — Telephone Encounter (Signed)
Called pt to notify her that her thyroid lab function test are normal but still waiting on the u/s bx results. The pt understands.

## 2012-10-23 ENCOUNTER — Ambulatory Visit
Admission: RE | Admit: 2012-10-23 | Discharge: 2012-10-23 | Disposition: A | Discharge status: Home / Self Care | Payer: BC Managed Care – PPO | Source: Ambulatory Visit | Attending: General Surgery | Admitting: General Surgery

## 2012-10-23 ENCOUNTER — Other Ambulatory Visit (HOSPITAL_COMMUNITY)
Admission: RE | Admit: 2012-10-23 | Discharge: 2012-10-23 | Disposition: A | Discharge status: Home / Self Care | Payer: BC Managed Care – PPO | Source: Ambulatory Visit | Attending: Interventional Radiology | Admitting: Interventional Radiology

## 2012-10-23 ENCOUNTER — Encounter (HOSPITAL_BASED_OUTPATIENT_CLINIC_OR_DEPARTMENT_OTHER): Payer: Self-pay | Admitting: *Deleted

## 2012-10-23 DIAGNOSIS — E041 Nontoxic single thyroid nodule: Secondary | ICD-10-CM | POA: Insufficient documentation

## 2012-10-23 NOTE — Progress Notes (Signed)
Pt had lumb disk surgery 8/14-did well-no labs needed

## 2012-10-26 ENCOUNTER — Encounter (HOSPITAL_BASED_OUTPATIENT_CLINIC_OR_DEPARTMENT_OTHER): Payer: Self-pay | Admitting: *Deleted

## 2012-10-29 ENCOUNTER — Encounter (HOSPITAL_BASED_OUTPATIENT_CLINIC_OR_DEPARTMENT_OTHER): Payer: Self-pay

## 2012-10-29 ENCOUNTER — Ambulatory Visit
Admission: RE | Admit: 2012-10-29 | Discharge: 2012-10-29 | Disposition: A | Discharge status: Home / Self Care | Payer: BC Managed Care – PPO | Source: Ambulatory Visit | Attending: General Surgery | Admitting: General Surgery

## 2012-10-29 ENCOUNTER — Ambulatory Visit (HOSPITAL_BASED_OUTPATIENT_CLINIC_OR_DEPARTMENT_OTHER)
Admission: RE | Admit: 2012-10-29 | Discharge: 2012-10-29 | Disposition: A | Discharge status: Home / Self Care | Payer: BC Managed Care – PPO | Source: Ambulatory Visit | Attending: General Surgery | Admitting: General Surgery

## 2012-10-29 ENCOUNTER — Ambulatory Visit (HOSPITAL_BASED_OUTPATIENT_CLINIC_OR_DEPARTMENT_OTHER): Payer: BC Managed Care – PPO | Admitting: Anesthesiology

## 2012-10-29 ENCOUNTER — Encounter (HOSPITAL_BASED_OUTPATIENT_CLINIC_OR_DEPARTMENT_OTHER): Payer: BC Managed Care – PPO | Admitting: Anesthesiology

## 2012-10-29 ENCOUNTER — Encounter (HOSPITAL_BASED_OUTPATIENT_CLINIC_OR_DEPARTMENT_OTHER)
Admission: RE | Disposition: A | Discharge status: Home / Self Care | Payer: Self-pay | Source: Ambulatory Visit | Attending: General Surgery

## 2012-10-29 DIAGNOSIS — N6089 Other benign mammary dysplasias of unspecified breast: Secondary | ICD-10-CM | POA: Insufficient documentation

## 2012-10-29 DIAGNOSIS — N6099 Unspecified benign mammary dysplasia of unspecified breast: Secondary | ICD-10-CM

## 2012-10-29 DIAGNOSIS — N6019 Diffuse cystic mastopathy of unspecified breast: Secondary | ICD-10-CM

## 2012-10-29 DIAGNOSIS — R920 Mammographic microcalcification found on diagnostic imaging of breast: Secondary | ICD-10-CM

## 2012-10-29 DIAGNOSIS — G4730 Sleep apnea, unspecified: Secondary | ICD-10-CM | POA: Insufficient documentation

## 2012-10-29 HISTORY — PX: BREAST BIOPSY: SHX20

## 2012-10-29 HISTORY — DX: Nausea with vomiting, unspecified: R11.2

## 2012-10-29 HISTORY — DX: Other specified postprocedural states: Z98.890

## 2012-10-29 HISTORY — DX: Presence of spectacles and contact lenses: Z97.3

## 2012-10-29 HISTORY — DX: Sleep apnea, unspecified: G47.30

## 2012-10-29 SURGERY — BREAST BIOPSY WITH NEEDLE LOCALIZATION
Anesthesia: General | Site: Breast | Laterality: Left | Wound class: Clean

## 2012-10-29 MED ORDER — BUPIVACAINE HCL (PF) 0.25 % IJ SOLN
INTRAMUSCULAR | Status: DC | PRN
  Administered 2012-10-29: 19 mL

## 2012-10-29 MED ORDER — FENTANYL CITRATE 0.05 MG/ML IJ SOLN: 50.0000 ug | INTRAMUSCULAR | Status: DC | PRN

## 2012-10-29 MED ORDER — LACTATED RINGERS IV SOLN
INTRAVENOUS | Status: DC
  Administered 2012-10-29: 13:00:00 via INTRAVENOUS

## 2012-10-29 MED ORDER — MIDAZOLAM HCL 2 MG/2ML IJ SOLN: 1.0000 mg | INTRAMUSCULAR | Status: DC | PRN

## 2012-10-29 MED ORDER — DEXAMETHASONE SODIUM PHOSPHATE 4 MG/ML IJ SOLN
INTRAMUSCULAR | Status: DC | PRN
  Administered 2012-10-29: 10 mg via INTRAVENOUS

## 2012-10-29 MED ORDER — OXYCODONE HCL 5 MG PO TABS: 5.0000 mg | ORAL_TABLET | Freq: Four times a day (QID) | ORAL | Status: DC | PRN

## 2012-10-29 MED ORDER — FENTANYL CITRATE 0.05 MG/ML IJ SOLN: 25.0000 ug | INTRAMUSCULAR | Status: DC | PRN

## 2012-10-29 MED ORDER — EPHEDRINE SULFATE 50 MG/ML IJ SOLN
INTRAMUSCULAR | Status: DC | PRN
  Administered 2012-10-29: 10 mg via INTRAVENOUS

## 2012-10-29 MED ORDER — CEFAZOLIN SODIUM-DEXTROSE 2-3 GM-% IV SOLR
INTRAVENOUS | Status: AC
  Filled 2012-10-29: qty 50

## 2012-10-29 MED ORDER — FENTANYL CITRATE 0.05 MG/ML IJ SOLN
INTRAMUSCULAR | Status: AC
  Filled 2012-10-29: qty 2

## 2012-10-29 MED ORDER — LIDOCAINE HCL (CARDIAC) 20 MG/ML IV SOLN
INTRAVENOUS | Status: DC | PRN
  Administered 2012-10-29: 80 mg via INTRAVENOUS

## 2012-10-29 MED ORDER — FENTANYL CITRATE 0.05 MG/ML IJ SOLN
INTRAMUSCULAR | Status: DC | PRN
  Administered 2012-10-29: 50 ug via INTRAVENOUS
  Administered 2012-10-29 (×2): 25 ug via INTRAVENOUS

## 2012-10-29 MED ORDER — PROPOFOL 10 MG/ML IV BOLUS
INTRAVENOUS | Status: DC | PRN
  Administered 2012-10-29: 160 mg via INTRAVENOUS

## 2012-10-29 MED ORDER — MIDAZOLAM HCL 5 MG/5ML IJ SOLN
INTRAMUSCULAR | Status: DC | PRN
  Administered 2012-10-29: 2 mg via INTRAVENOUS

## 2012-10-29 MED ORDER — CEFAZOLIN SODIUM-DEXTROSE 2-3 GM-% IV SOLR
2.0000 g | INTRAVENOUS | Status: AC
  Administered 2012-10-29: 2 g via INTRAVENOUS

## 2012-10-29 MED ORDER — MIDAZOLAM HCL 2 MG/2ML IJ SOLN
INTRAMUSCULAR | Status: AC
  Filled 2012-10-29: qty 2

## 2012-10-29 MED ORDER — BUPIVACAINE HCL (PF) 0.25 % IJ SOLN
INTRAMUSCULAR | Status: AC
  Filled 2012-10-29: qty 30

## 2012-10-29 SURGICAL SUPPLY — 56 items
ADH SKN CLS APL DERMABOND .7 (GAUZE/BANDAGES/DRESSINGS) ×1
APL SKNCLS STERI-STRIP NONHPOA (GAUZE/BANDAGES/DRESSINGS)
APPLIER CLIP 9.375 MED OPEN (MISCELLANEOUS)
APR CLP MED 9.3 20 MLT OPN (MISCELLANEOUS)
BENZOIN TINCTURE PRP APPL 2/3 (GAUZE/BANDAGES/DRESSINGS) IMPLANT
BINDER BREAST LRG (GAUZE/BANDAGES/DRESSINGS) IMPLANT
BINDER BREAST MEDIUM (GAUZE/BANDAGES/DRESSINGS) IMPLANT
BINDER BREAST XLRG (GAUZE/BANDAGES/DRESSINGS) IMPLANT
BINDER BREAST XXLRG (GAUZE/BANDAGES/DRESSINGS) ×1 IMPLANT
BLADE SURG 15 STRL LF DISP TIS (BLADE) ×1 IMPLANT
BLADE SURG 15 STRL SS (BLADE) ×2
CANISTER SUCT 1200ML W/VALVE (MISCELLANEOUS) ×1 IMPLANT
CHLORAPREP W/TINT 26ML (MISCELLANEOUS) ×2 IMPLANT
CLIP APPLIE 9.375 MED OPEN (MISCELLANEOUS) IMPLANT
COVER MAYO STAND STRL (DRAPES) ×2 IMPLANT
COVER TABLE BACK 60X90 (DRAPES) ×2 IMPLANT
DECANTER SPIKE VIAL GLASS SM (MISCELLANEOUS) IMPLANT
DERMABOND ADVANCED (GAUZE/BANDAGES/DRESSINGS) ×1
DERMABOND ADVANCED .7 DNX12 (GAUZE/BANDAGES/DRESSINGS) IMPLANT
DEVICE DUBIN W/COMP PLATE 8390 (MISCELLANEOUS) ×1 IMPLANT
DRAPE PED LAPAROTOMY (DRAPES) ×2 IMPLANT
DRSG TEGADERM 4X4.75 (GAUZE/BANDAGES/DRESSINGS) ×2 IMPLANT
ELECT COATED BLADE 2.86 ST (ELECTRODE) ×2 IMPLANT
ELECT REM PT RETURN 9FT ADLT (ELECTROSURGICAL) ×2
ELECTRODE REM PT RTRN 9FT ADLT (ELECTROSURGICAL) ×1 IMPLANT
GAUZE SPONGE 4X4 12PLY STRL LF (GAUZE/BANDAGES/DRESSINGS) ×2 IMPLANT
GLOVE BIO SURGEON STRL SZ7 (GLOVE) ×2 IMPLANT
GLOVE BIOGEL PI IND STRL 7.0 (GLOVE) IMPLANT
GLOVE BIOGEL PI IND STRL 7.5 (GLOVE) ×1 IMPLANT
GLOVE BIOGEL PI INDICATOR 7.0 (GLOVE) ×1
GLOVE BIOGEL PI INDICATOR 7.5 (GLOVE) ×1
GLOVE ECLIPSE 6.5 STRL STRAW (GLOVE) ×1 IMPLANT
GOWN PREVENTION PLUS XLARGE (GOWN DISPOSABLE) ×3 IMPLANT
KIT MARKER MARGIN INK (KITS) IMPLANT
NDL HYPO 25X1 1.5 SAFETY (NEEDLE) ×1 IMPLANT
NEEDLE HYPO 25X1 1.5 SAFETY (NEEDLE) ×2 IMPLANT
NS IRRIG 1000ML POUR BTL (IV SOLUTION) IMPLANT
PACK BASIN DAY SURGERY FS (CUSTOM PROCEDURE TRAY) ×2 IMPLANT
PENCIL BUTTON HOLSTER BLD 10FT (ELECTRODE) ×2 IMPLANT
SLEEVE SCD COMPRESS KNEE MED (MISCELLANEOUS) ×2 IMPLANT
SPONGE LAP 4X18 X RAY DECT (DISPOSABLE) ×2 IMPLANT
STRIP CLOSURE SKIN 1/2X4 (GAUZE/BANDAGES/DRESSINGS) ×1 IMPLANT
SUT MNCRL AB 4-0 PS2 18 (SUTURE) ×1 IMPLANT
SUT MON AB 5-0 PS2 18 (SUTURE) IMPLANT
SUT SILK 2 0 SH (SUTURE) ×2 IMPLANT
SUT VIC AB 2-0 SH 27 (SUTURE) ×2
SUT VIC AB 2-0 SH 27XBRD (SUTURE) ×1 IMPLANT
SUT VIC AB 3-0 SH 27 (SUTURE) ×2
SUT VIC AB 3-0 SH 27X BRD (SUTURE) ×1 IMPLANT
SUT VIC AB 5-0 PS2 18 (SUTURE) IMPLANT
SUT VICRYL AB 3 0 TIES (SUTURE) IMPLANT
SYR CONTROL 10ML LL (SYRINGE) ×2 IMPLANT
TOWEL OR 17X24 6PK STRL BLUE (TOWEL DISPOSABLE) ×2 IMPLANT
TOWEL OR NON WOVEN STRL DISP B (DISPOSABLE) ×2 IMPLANT
TUBE CONNECTING 20X1/4 (TUBING) ×1 IMPLANT
YANKAUER SUCT BULB TIP NO VENT (SUCTIONS) ×1 IMPLANT

## 2012-10-29 NOTE — Op Note (Signed)
Preoperative diagnosis: Left breast atypical ductal hyperplasia on core biopsy Postoperative diagnosis: Same as above Procedure: Left breast wire-guided excision Surgeon: Dr. Harden Mo Anesthesia: Gen. With LMA Estimated blood loss: Minimal Specimens: Left breast tissue marked short stitch superior, long stitch lateral, double stitch deep Complications: None Drains: None Disposition to recovery in stable condition Sponge and needle count correct at completion  Indications: This is a 62 yo female last year had an abnormal mammogram with a biopsy of some calcifications that showed atypical ductal hyperplasia. She declined to have this evaluated at that point and elected to have this followed. She is really tried to apply for insurance for which she says they were not going to grant her due to the fact that she still had this present. She came back and underwent a mammogram again and then was referred to see me. She and I discussed a possible excision based on the fact that she had atypical ductal hyperplasia on core. We discussed indication for biopsies. She agreed to undergo biopsy.  Procedure: [facility]. Deboraha Sprang. I had these mammograms in the operating room.After informed consent was obtained she was then taken to the operating room. She was given 2 g of cefazolin. Sequential compression devices were on her legs. Her left breast was prepped and draped in the standard sterile surgical fashion. A surgical timeout was then performed.  I injected Marcaine throughout the lateral portion of the left breast. I then made a curvilinear incision. I used cautery to excise the wire as well as the surrounding tissue taking care to remove as little tissue as possible. I then marked this. A mammogram was taken confirming removal of the clip as well as the wire. This was confirmed by radiology. Hemostasis was then obtained. I then closed the breast tissue 2-0 Vicryl.  The skin was closed with 3-0 Vicryl and 4 Monocryl. Dermabond and Steri-Strips were placed. A breast binder was placed. She tolerated this well was extubated and transferred to recovery stable.

## 2012-10-29 NOTE — Anesthesia Preprocedure Evaluation (Addendum)
Anesthesia Evaluation  Patient identified by MRN, date of birth, ID band Patient awake    Reviewed: Allergy & Precautions, H&P , NPO status , Patient's Chart, lab work & pertinent test results  History of Anesthesia Complications (+) PONV  Airway Mallampati: II      Dental   Pulmonary sleep apnea ,  breath sounds clear to auscultation        Cardiovascular negative cardio ROS  Rhythm:Regular Rate:Normal     Neuro/Psych    GI/Hepatic negative GI ROS, Neg liver ROS,   Endo/Other  negative endocrine ROS  Renal/GU negative Renal ROS     Musculoskeletal   Abdominal   Peds  Hematology negative hematology ROS (+)   Anesthesia Other Findings   Reproductive/Obstetrics                           Anesthesia Physical Anesthesia Plan  ASA: II  Anesthesia Plan: General   Post-op Pain Management:    Induction: Intravenous  Airway Management Planned: LMA  Additional Equipment:   Intra-op Plan:   Post-operative Plan: Extubation in OR  Informed Consent: I have reviewed the patients History and Physical, chart, labs and discussed the procedure including the risks, benefits and alternatives for the proposed anesthesia with the patient or authorized representative who has indicated his/her understanding and acceptance.   Dental advisory given  Plan Discussed with: CRNA, Anesthesiologist and Surgeon  Anesthesia Plan Comments:         Anesthesia Quick Evaluation

## 2012-10-29 NOTE — Interval H&P Note (Signed)
History and Physical Interval Note:  10/29/2012 12:58 PM  Virginia Lindsey  has presented today for surgery, with the diagnosis of remove left breast calcifications  The various methods of treatment have been discussed with the patient and family. After consideration of risks, benefits and other options for treatment, the patient has consented to  Procedure(s):  LEFT BREAST WIRE GUIDED EXCISION (Left) as a surgical intervention .  The patient's history has been reviewed, patient examined, no change in status, stable for surgery.  I have reviewed the patient's chart and labs.  Questions were answered to the patient's satisfaction.     Westlee Devita

## 2012-10-29 NOTE — Anesthesia Postprocedure Evaluation (Signed)
  Anesthesia Post-op Note  Patient: Virginia Lindsey  Procedure(s) Performed: Procedure(s):  LEFT BREAST WIRE GUIDED EXCISION (Left)  Patient Location: PACU  Anesthesia Type:General  Level of Consciousness: awake  Airway and Oxygen Therapy: Patient Spontanous Breathing  Post-op Pain: mild  Post-op Assessment: Post-op Vital signs reviewed  Post-op Vital Signs: Reviewed  Complications: No apparent anesthesia complications

## 2012-10-29 NOTE — H&P (View-Only) (Signed)
Patient ID: Virginia Lindsey, female   DOB: 12-22-1950, 62 y.o.   MRN: [id number]  Chief Complaint  Patient presents with  . New Evaluation    new br ca    HPI Virginia Lindsey is a 62 y.o. female.  Referred by Dr Laveda Abbe HPI This is a 62 year old female who underwent evaluation with the mammogram in 2013 where she had microcalcifications noted specifically on the left side that were new. This underwent a biopsy in October of 2013 which showed atypical ductal hyperplasia with calcifications. She chose at that point not to undergo surgical evaluation and just be followed. She had a repeat mammogram which still shows that these calcifications are present. She has applied for some insurance and may have been denied at this point apparently. She comes in today to discuss excision. She is now wanting to undergo this procedure. She reports no complaints with either breast. She has no history of any breast disease. She has no family history of breast or ovarian cancer. Past Medical History  Diagnosis Date  . Seizures     last seizure 2004  3 total 10 yrs apart    Past Surgical History  Procedure Laterality Date  . Cholecystectomy    . Abdominal hysterectomy      partial hysterectomy  . Lumbar laminectomy/decompression microdiscectomy Right 08/27/2012    Procedure: RIGHT  LUMBAR FOUR FIVE LAMINECTOMY/DECOMPRESSION MICRODISCECTOMY 1 LEVEL;  Surgeon: Mariam Dollar, MD;  Location: [facility];  Service: Neurosurgery;  Laterality: Right;  . Breast surgery      biopsy    Family History  Problem Relation Age of Onset  . Asthma Mother   . Cancer Mother     renal cell carcinoma  . Cancer Father     melanoma on face  . Diabetes Maternal Grandmother     Social History History  Substance Use Topics  . Smoking status: Never Smoker   . Smokeless tobacco: Never Used  . Alcohol Use: No    Allergies  Allergen Reactions  . Hydrocodone Nausea And Vomiting    Also lightheaded and disoriented  .  Codeine Nausea And Vomiting    No current outpatient prescriptions on file.   No current facility-administered medications for this visit.    Review of Systems Review of Systems  Constitutional: Negative for fever, chills and unexpected weight change.  HENT: Negative for congestion, hearing loss, sore throat, trouble swallowing and voice change.   Eyes: Negative for visual disturbance.  Respiratory: Negative for cough and wheezing.   Cardiovascular: Negative for chest pain, palpitations and leg swelling.  Gastrointestinal: Negative for nausea, vomiting, abdominal pain, diarrhea, constipation, blood in stool, abdominal distention and anal bleeding.  Genitourinary: Negative for hematuria, vaginal bleeding and difficulty urinating.  Musculoskeletal: Negative for arthralgias.  Skin: Negative for rash and wound.  Neurological: Negative for seizures, syncope and headaches.  Hematological: Negative for adenopathy. Does not bruise/bleed easily.  Psychiatric/Behavioral: Negative for confusion.    Blood pressure 138/78, pulse 70, temperature 97.5 F (36.4 C), temperature source Temporal, resp. rate 14, height 5\' 4"  (1.626 m), weight 216 lb 12.8 oz (98.34 kg).  Physical Exam Physical Exam  Vitals reviewed. Constitutional: She appears well-developed and well-nourished.  Neck: Neck supple. Thyromegaly present.  Cardiovascular: Normal rate, regular rhythm and normal heart sounds.   Pulmonary/Chest: Effort normal and breath sounds normal. She has no wheezes. She has no rales. Right breast exhibits no inverted nipple, no mass, no nipple discharge, no skin change and  no tenderness. Left breast exhibits no inverted nipple, no mass, no nipple discharge, no skin change and no tenderness.  Lymphadenopathy:    She has no cervical adenopathy.    She has no axillary adenopathy.       Right: No supraclavicular adenopathy present.       Left: No supraclavicular adenopathy present.    Data  Reviewed DIGITAL DIAGNOSTIC BILATERAL MAMMOGRAM WITH CAD  Comparison: Previous examinations, the most recent dated  10/17/2011, 10/10/2011 and 10/04/2011.  Findings:  ACR Breast Density Category b: There are scattered areas of  fibroglandular density.  The previously evaluated right breast probably benign  calcifications are unchanged. There are only a few tiny residual  microcalcifications in the left breast at the location of the  marker clip placed at the time of stereotactic guided core needle  biopsy on 10/17/2011. No new findings suspicious for malignancy in  either breast.  Mammographic images were processed with CAD.  IMPRESSION:  1. Left breast atypical ductal hyperplasia diagnosed on  stereotactic guided core needle biopsy on 10/17/2011. It was  previously recommended that this be excised. However, the patient  elected to not have this area excised. Again, surgical excision is  recommended due to the possibility of coexistent ductal carcinoma  in situ . This was discussed at length with the patient for  approximately 30 minutes today. She says that she will think about  having the surgical excision done but is not ready to commit to  that today.  2. Stable right breast probably benign calcifications. A follow-  up right diagnostic mammogram is recommended in 6 months.  RECOMMENDATION:  1. Left breast needle localization and surgical excision of the  area of previously biopsied atypical ductal hyperplasia due to the  risk of coexistent ductal carcinoma in situ.  2. Right breast diagnostic mammogram in 6 months.  I have discussed the findings and recommendations with the patient.  Results were also provided in writing at the conclusion of the  visit. If applicable, a reminder letter will be sent to the  patient regarding her next appointment.    Assessment    Left breast ADH     Plan    Left breast wire guided excisional biopsy     We discussed the indication for  excision to be to ensure that there are no cancer cells present in the specimen. We discussed that ADH already puts her at high risk for developing breast cancer and that at a minimum I will refer her to see Dr. Welton Flakes in the risk reduction clinic. We discussed the performance of the procedure with wire guidance. We discussed the risk and benefits associated with that. I would await to see if she needs any surgery done with her thyroid and then we'll plan on scheduling her following that evaluation by my partner Dr. Andrey Campanile.   Pranav Lince 10/19/2012, 9:54 AM

## 2012-10-29 NOTE — Transfer of Care (Signed)
Immediate Anesthesia Transfer of Care Note  Patient: Virginia Lindsey  Procedure(s) Performed: Procedure(s):  LEFT BREAST WIRE GUIDED EXCISION (Left)  Patient Location: PACU  Anesthesia Type:General  Level of Consciousness: awake, oriented and patient cooperative  Airway & Oxygen Therapy: Patient Spontanous Breathing and Patient connected to face mask oxygen  Post-op Assessment: Report given to PACU RN and Post -op Vital signs reviewed and stable  Post vital signs: Reviewed and stable  Complications: No apparent anesthesia complications

## 2012-10-30 ENCOUNTER — Encounter (HOSPITAL_BASED_OUTPATIENT_CLINIC_OR_DEPARTMENT_OTHER): Payer: Self-pay | Admitting: General Surgery

## 2012-11-01 ENCOUNTER — Telehealth (INDEPENDENT_AMBULATORY_CARE_PROVIDER_SITE_OTHER): Payer: Self-pay

## 2012-11-01 NOTE — Telephone Encounter (Signed)
Called pt to notify her that her pathology report is benign per Dr Dwain Sarna.

## 2012-11-01 NOTE — Telephone Encounter (Signed)
Pt calling to request the final report on the thyroid needle aspiration results. Please advise.

## 2012-11-02 NOTE — Telephone Encounter (Signed)
Discussed thyroid aspiration results with patient. They had 95 cc of fluid to evaluate. Technically they read the fluid as unsatisfactory. There were no cells in the fluid. 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 I believe there is no role for repeat aspiration since it was technically drained and there was no radiological concerning features for malignancy. My recommendation was to get a repeat neck ultrasound in 6 months to one year to monitor her other 2 nodules to make sure they're not increasing in size. There ultrasound appearance was also reassuring. I explained that it is possible that her cysts could reaccumulate fluid. We discussed options at that time which would include repeat aspiration, possible ablation by radiology if they offer that here or surgical lobectomy. We will put her on a recall list to have a repeat ultrasound of her neck in one year. She was advised to call with any questions or recurrent symptoms

## 2012-11-13 ENCOUNTER — Encounter (INDEPENDENT_AMBULATORY_CARE_PROVIDER_SITE_OTHER): Payer: Self-pay | Admitting: General Surgery

## 2012-11-13 ENCOUNTER — Ambulatory Visit (INDEPENDENT_AMBULATORY_CARE_PROVIDER_SITE_OTHER): Payer: BC Managed Care – PPO | Admitting: General Surgery

## 2012-11-13 VITALS — BP 132/84 | HR 68 | Temp 97.7°F | Resp 14 | Ht 64.0 in | Wt 217.8 lb

## 2012-11-13 DIAGNOSIS — Z09 Encounter for follow-up examination after completed treatment for conditions other than malignant neoplasm: Secondary | ICD-10-CM

## 2012-11-13 DIAGNOSIS — N6089 Other benign mammary dysplasias of unspecified breast: Secondary | ICD-10-CM

## 2012-11-13 DIAGNOSIS — N6099 Unspecified benign mammary dysplasia of unspecified breast: Secondary | ICD-10-CM

## 2012-11-14 NOTE — Progress Notes (Signed)
Subjective:     Patient ID: Virginia Lindsey, female   DOB: 17-Nov-1950, 62 y.o.   MRN: [id number]  HPI 2 yof who had core biopsy that showed adh and eventually underwent a left breast wire loc biopsy that showed only fibrocystic change.  She returns today doing well without complaint.  Review of Systems Breast, left, needle core biopsy, outer - ATYPICAL DUCTAL HYPERPLASIA WITH CALCIFICATIONS. - FIBROCYSTIC CHANGES WITH ADENOSIS, CALCIFICATIONS, AND USUAL DUCTAL HYPERPLASIA. - SEE COMMENT.   Breast, lumpectomy, Left - FIBROCYSTIC CHANGE WITH ADNEOSIS, USUAL DUCTAL HYPERPLASIA, AND CALCIFICATIONS. - NO ATYPIA OR MALIGNANCY IDENTIFIED.    Objective:   Physical Exam Left breast incision clean without infection    Assessment:     ADH on core s/p excision     Plan:     She is doing well and I released her to full activity.  We discussed monthly self exams, annual mm and annual clinical exams.  I will also refer to Dr Welton Flakes for evaluation for risk reduction.

## 2012-12-14 ENCOUNTER — Telehealth: Payer: Self-pay | Admitting: Oncology

## 2012-12-14 NOTE — Telephone Encounter (Signed)
CALLED PT TO R/S HIGH RISK APPT PER PT SHE REQUESTED AN APPT BEFORE THE END OF THE YEAR. PER PATIENT SHE WOULD LIKE TO S/W MD TOOK PHONE NUMBER AND WILL FORWARD TO DR. Welton Flakes.

## 2012-12-18 ENCOUNTER — Encounter: Payer: BC Managed Care – PPO | Admitting: Oncology

## 2013-09-24 ENCOUNTER — Other Ambulatory Visit: Payer: Self-pay | Admitting: Internal Medicine

## 2013-09-24 DIAGNOSIS — E041 Nontoxic single thyroid nodule: Secondary | ICD-10-CM

## 2013-09-25 ENCOUNTER — Ambulatory Visit
Admission: RE | Admit: 2013-09-25 | Discharge: 2013-09-25 | Disposition: A | Discharge status: Home / Self Care | Payer: BC Managed Care – PPO | Source: Ambulatory Visit | Attending: Internal Medicine | Admitting: Internal Medicine

## 2013-09-25 DIAGNOSIS — E041 Nontoxic single thyroid nodule: Secondary | ICD-10-CM

## 2013-10-11 ENCOUNTER — Encounter (HOSPITAL_COMMUNITY): Payer: Self-pay | Admitting: Emergency Medicine

## 2013-10-11 ENCOUNTER — Emergency Department (HOSPITAL_COMMUNITY)
Admission: EM | Admit: 2013-10-11 | Discharge: 2013-10-11 | Disposition: A | Discharge status: Home / Self Care | Payer: BC Managed Care – PPO | Attending: Emergency Medicine | Admitting: Emergency Medicine

## 2013-10-11 ENCOUNTER — Emergency Department (HOSPITAL_COMMUNITY): Payer: BC Managed Care – PPO

## 2013-10-11 DIAGNOSIS — Z9889 Other specified postprocedural states: Secondary | ICD-10-CM | POA: Insufficient documentation

## 2013-10-11 DIAGNOSIS — M47895 Other spondylosis, thoracolumbar region: Secondary | ICD-10-CM | POA: Diagnosis not present

## 2013-10-11 DIAGNOSIS — M545 Low back pain: Secondary | ICD-10-CM | POA: Diagnosis present

## 2013-10-11 DIAGNOSIS — G8929 Other chronic pain: Secondary | ICD-10-CM | POA: Diagnosis not present

## 2013-10-11 DIAGNOSIS — M47819 Spondylosis without myelopathy or radiculopathy, site unspecified: Secondary | ICD-10-CM

## 2013-10-11 HISTORY — DX: Dorsalgia, unspecified: M54.9

## 2013-10-11 MED ORDER — TRAMADOL HCL 50 MG PO TABS: 50.0000 mg | ORAL_TABLET | Freq: Four times a day (QID) | ORAL | Status: DC | PRN

## 2013-10-11 MED ORDER — CYCLOBENZAPRINE HCL 10 MG PO TABS: 10.0000 mg | ORAL_TABLET | Freq: Two times a day (BID) | ORAL | Status: DC | PRN

## 2013-10-11 NOTE — ED Provider Notes (Signed)
CSN: [id number]     Arrival date & time 10/11/13  1149 History  This chart was scribed for non-physician practitioner, Glendell Docker, NP working with Richarda Blade, MD, by Erling Conte, ED Scribe. This patient was seen in room WTR6/WTR6 and the patient's care was started at 12:08 PM.    Chief Complaint  Patient presents with  . Back Pain    low back pain x 4 days    The history is provided by the patient. No language interpreter was used.   HPI Comments: Virginia Lindsey is a 63 y.o. female with a h/o sleep apnea, seizures and chronic back pain who presents to the Emergency Department complaining of constant, moderate, "aching", "8/10", lower back pain for 4 days. The pain does not radiate. Patient states that she turned the wrong way and "retweaked" her back. She previously had surgery on her lower lumbar. Pt states she has been having associated difficulty walking and walking very slowly due to pain. She notes that the pain is exacerbated by movement and turning her head. Pt has been taking Meoxicam and cyclobenzaprine with no relief. She is not a diabetic. She has an appt to see Dr. Saintclair Halsted on Tuesday. She denies any weakness, numbness, urinary or bowel incontinence.       Past Medical History  Diagnosis Date  . PONV (postoperative nausea and vomiting)   . Wears glasses   . Sleep apnea     used to use a cpap-dr domieur told her she did bot need one any more  . Seizures     last seizure 2004  3 total 10 yrs apart  . Back pain    Past Surgical History  Procedure Laterality Date  . Cholecystectomy    . Lumbar laminectomy/decompression microdiscectomy Right 08/27/2012    Procedure: RIGHT  LUMBAR FOUR FIVE LAMINECTOMY/DECOMPRESSION MICRODISCECTOMY 1 LEVEL;  Surgeon: Elaina Hoops, MD;  Location: [facility];  Service: Neurosurgery;  Laterality: Right;  . Breast surgery  2013    biopsy-lt  . Abdominal hysterectomy  1991    partial hysterectomy  . Colonoscopy    . Upper gi  endoscopy    . Breast biopsy Left 10/29/2012    Procedure:  LEFT BREAST WIRE GUIDED EXCISION;  Surgeon: Rolm Bookbinder, MD;  Location: [facility];  Service: General;  Laterality: Left;   Family History  Problem Relation Age of Onset  . Asthma Mother   . Cancer Mother     renal cell carcinoma  . Cancer Father     melanoma on face  . Diabetes Maternal Grandmother    History  Substance Use Topics  . Smoking status: Never Smoker   . Smokeless tobacco: Never Used  . Alcohol Use: No   OB History   Grav Para Term Preterm Abortions TAB SAB Ect Mult Living                 Review of Systems  Gastrointestinal:       No bowel incontinence  Genitourinary:       No urinary incontinence  Musculoskeletal: Positive for back pain and gait problem.  Neurological: Negative for weakness and numbness.  All other systems reviewed and are negative.     Allergies  Hydrocodone and Codeine  Home Medications   Prior to Admission medications   Not on File   Triage Vitals: BP 153/86  Pulse 77  Temp(Src) 98.3 F (36.8 C) (Oral)  Resp 20  SpO2 98%  Physical  Exam  Nursing note and vitals reviewed. Constitutional: She is oriented to person, place, and time. She appears well-developed and well-nourished. No distress.  HENT:  Head: Normocephalic and atraumatic.  Eyes: Conjunctivae and EOM are normal.  Neck: Neck supple. No tracheal deviation present.  Cardiovascular: Normal rate.   Pulmonary/Chest: Effort normal. No respiratory distress.  Musculoskeletal: Normal range of motion. She exhibits tenderness.  Equal strength. Tender to lower lumbar spine. Moving all extremities without difficulty  Neurological: She is alert and oriented to person, place, and time.  Skin: Skin is warm and dry.  Psychiatric: She has a normal mood and affect. Her behavior is normal.    ED Course  Procedures (including critical care time)  DIAGNOSTIC STUDIES: Oxygen Saturation is 98% on RA,  normal by my interpretation.    COORDINATION OF CARE: 12:18 PM- Will order diagnostic imaging of lumbar spine. Pt advised of plan for treatment and pt agrees.  Labs Review Labs Reviewed - No data to display  Imaging Review Dg Lumbar Spine Complete  10/11/2013   CLINICAL DATA:  Patient stumbled and fell against shower wall ; low back pain toward the right side. Status post surgery at L4-5 level.  EXAM: LUMBAR SPINE - COMPLETE 4+ VIEW  COMPARISON:  Lumbar MRI August 07, 2012  FINDINGS: Frontal, lateral, spot lumbosacral lateral, and bilateral oblique views were obtained. There are 5 non-rib-bearing lumbar type vertebral bodies. 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 There is no fracture or spondylolisthesis. There is moderate disc space narrowing at L4-5. There is also mild narrowing at L3-4 and L5-S1. There is facet osteoarthritic change at L4-5 and L5-S1 bilaterally.  IMPRESSION: Areas of osteoarthritic change, most notably at L4-5. No fracture or spondylolisthesis.   Electronically Signed   By: Lowella Grip M.D.   On: 10/11/2013 12:42     EKG Interpretation None      MDM   Final diagnoses:  Osteoarthritis of thoracolumbar spine, unspecified spinal osteoarthritis    Pt is neurovascularly intact. Will treat symptomatically with ultram and flexeril. Pt is okay to follow up with Dr. Saintclair Halsted next week. Pt okay with plan. Discussed symptoms that would warrant return  I personally performed the services described in this documentation, which was scribed in my presence. The recorded information has been reviewed and is accurate.     Glendell Docker, NP 10/11/13 1305

## 2013-10-11 NOTE — Discharge Instructions (Signed)
Radicular Pain Radicular pain in either the arm or leg is usually from a bulging or herniated disk in the spine. A piece of the herniated disk may press against the nerves as the nerves exit the spine. This causes pain which is felt at the tips of the nerves down the arm or leg. Other causes of radicular pain may include:  Fractures.  Heart disease.  Cancer.  An abnormal and usually degenerative state of the nervous system or nerves (neuropathy). Diagnosis may require CT or MRI scanning to determine the primary cause.  Nerves that start at the neck (nerve roots) may cause radicular pain in the outer shoulder and arm. It can spread down to the thumb and fingers. The symptoms vary depending on which nerve root has been affected. In most cases radicular pain improves with conservative treatment. Neck problems may require physical therapy, a neck collar, or cervical traction. Treatment may take many weeks, and surgery may be considered if the symptoms do not improve.  Conservative treatment is also recommended for sciatica. Sciatica causes pain to radiate from the lower back or buttock area down the leg into the foot. Often there is a history of back problems. Most patients with sciatica are better after 2 to 4 weeks of rest and other supportive care. Short term bed rest can reduce the disk pressure considerably. Sitting, however, is not a good position since this increases the pressure on the disk. You should avoid bending, lifting, and all other activities which make the problem worse. Traction can be used in severe cases. Surgery is usually reserved for patients who do not improve within the first months of treatment. Only take over-the-counter or prescription medicines for pain, discomfort, or fever as directed by your caregiver. Narcotics and muscle relaxants may help by relieving more severe pain and spasm and by providing mild sedation. Cold or massage can give significant relief. Spinal manipulation  is not recommended. It can increase the degree of disc protrusion. Epidural steroid injections are often effective treatment for radicular pain. These injections deliver medicine to the spinal nerve in the space between the protective covering of the spinal cord and back bones (vertebrae). Your caregiver can give you more information about steroid injections. These injections are most effective when given within two weeks of the onset of pain.  You should see your caregiver for follow up care as recommended. A program for neck and back injury rehabilitation with stretching and strengthening exercises is an important part of management.  SEEK IMMEDIATE MEDICAL CARE IF:  You develop increased pain, weakness, or numbness in your arm or leg.  You develop difficulty with bladder or bowel control.  You develop abdominal pain. Document Released: 01/28/2004 Document Revised: 03/14/2011 Document Reviewed: 04/14/2008 [facility] Patient Information 2015 [place], [place]. This information is not intended to replace advice given to you by your health care provider. Make sure you discuss any questions you have with your health care provider.

## 2013-10-11 NOTE — ED Notes (Signed)
Pt c/o low back pain x 4 days. Pt reports that she turned the wrong way while taking a shower on Monday. Treated with medication x 1. Scheduled to be seen by Dr. Saintclair Halsted as CNS on Tuesday.

## 2013-10-11 NOTE — ED Provider Notes (Signed)
Medical screening examination/treatment/procedure(s) were performed by non-physician practitioner and as supervising physician I was immediately available for consultation/collaboration.  Richarda Blade, MD 10/11/13 [phone]

## 2013-11-29 ENCOUNTER — Emergency Department (HOSPITAL_COMMUNITY)
Admission: EM | Admit: 2013-11-29 | Discharge: 2013-11-29 | Disposition: A | Discharge status: Home / Self Care | Payer: BC Managed Care – PPO | Attending: Emergency Medicine | Admitting: Emergency Medicine

## 2013-11-29 ENCOUNTER — Encounter (HOSPITAL_COMMUNITY): Payer: Self-pay | Admitting: Emergency Medicine

## 2013-11-29 DIAGNOSIS — S61411A Laceration without foreign body of right hand, initial encounter: Secondary | ICD-10-CM | POA: Diagnosis not present

## 2013-11-29 DIAGNOSIS — Y93G1 Activity, food preparation and clean up: Secondary | ICD-10-CM | POA: Diagnosis not present

## 2013-11-29 DIAGNOSIS — W25XXXA Contact with sharp glass, initial encounter: Secondary | ICD-10-CM | POA: Insufficient documentation

## 2013-11-29 DIAGNOSIS — Z23 Encounter for immunization: Secondary | ICD-10-CM | POA: Diagnosis not present

## 2013-11-29 DIAGNOSIS — Z9981 Dependence on supplemental oxygen: Secondary | ICD-10-CM | POA: Insufficient documentation

## 2013-11-29 DIAGNOSIS — Y998 Other external cause status: Secondary | ICD-10-CM | POA: Insufficient documentation

## 2013-11-29 DIAGNOSIS — Y9289 Other specified places as the place of occurrence of the external cause: Secondary | ICD-10-CM | POA: Diagnosis not present

## 2013-11-29 DIAGNOSIS — Z79899 Other long term (current) drug therapy: Secondary | ICD-10-CM | POA: Insufficient documentation

## 2013-11-29 DIAGNOSIS — G4730 Sleep apnea, unspecified: Secondary | ICD-10-CM | POA: Diagnosis not present

## 2013-11-29 MED ORDER — BACITRACIN ZINC 500 UNIT/GM EX OINT: 1.0000 "application " | TOPICAL_OINTMENT | Freq: Two times a day (BID) | CUTANEOUS | Status: DC

## 2013-11-29 MED ORDER — LIDOCAINE HCL 1 % IJ SOLN
10.0000 mL | Freq: Once | INTRAMUSCULAR | Status: AC
  Administered 2013-11-29: 10 mL
  Filled 2013-11-29: qty 20

## 2013-11-29 MED ORDER — TETANUS-DIPHTH-ACELL PERTUSSIS 5-2.5-18.5 LF-MCG/0.5 IM SUSP
0.5000 mL | Freq: Once | INTRAMUSCULAR | Status: AC
  Administered 2013-11-29: 0.5 mL via INTRAMUSCULAR
  Filled 2013-11-29: qty 0.5

## 2013-11-29 NOTE — ED Provider Notes (Signed)
CSN: [id number]     Arrival date & time 11/29/13  2016 History  This chart was scribed for non-physician practitioner, Antonietta Breach, PA-C,working with Quintella Reichert, MD, by Marlowe Kays, ED Scribe. This patient was seen in room [place] and the patient's care was started at 8:37 PM.  Chief Complaint  Patient presents with  . Laceration    Right Hand   Patient is a 63 y.o. female presenting with skin laceration. The history is provided by the patient. No language interpreter was used.  Laceration   HPI Comments:  Virginia Lindsey is a 63 y.o. female who presents to the Emergency Department complaining of a laceration to the ulnar aspect of the right hand that occurred approximately one hour ago while washing dishes. She states she a large piece of the glass broke off cutting her hand. She reports running her hand under water to irrigate it. She reports associated bleeding that has since resolved. Denies any alleviating or worsening factors. Denies taking anticoagulants. Pt unsure of last tetanus vaccination. Denies numbness, tingling or weakness of the right hand. PMH of sleep apnea, seizures and back pain.  Past Medical History  Diagnosis Date  . PONV (postoperative nausea and vomiting)   . Wears glasses   . Sleep apnea     used to use a cpap-dr domieur told her she did bot need one any more  . Seizures     last seizure 2004  3 total 10 yrs apart  . Back pain    Past Surgical History  Procedure Laterality Date  . Cholecystectomy    . Lumbar laminectomy/decompression microdiscectomy Right 08/27/2012    Procedure: RIGHT  LUMBAR FOUR FIVE LAMINECTOMY/DECOMPRESSION MICRODISCECTOMY 1 LEVEL;  Surgeon: Elaina Hoops, MD;  Location: [facility];  Service: Neurosurgery;  Laterality: Right;  . Breast surgery  2013    biopsy-lt  . Abdominal hysterectomy  1991    partial hysterectomy  . Colonoscopy    . Upper gi endoscopy    . Breast biopsy Left 10/29/2012    Procedure:  LEFT BREAST WIRE  GUIDED EXCISION;  Surgeon: Rolm Bookbinder, MD;  Location: [facility];  Service: General;  Laterality: Left;   Family History  Problem Relation Age of Onset  . Asthma Mother   . Cancer Mother     renal cell carcinoma  . Cancer Father     melanoma on face  . Diabetes Maternal Grandmother    History  Substance Use Topics  . Smoking status: Never Smoker   . Smokeless tobacco: Never Used  . Alcohol Use: No   OB History    No data available     Review of Systems  Skin: Positive for wound.  All other systems reviewed and are negative.   Allergies  Hydrocodone and Codeine  Home Medications   Prior to Admission medications   Medication Sig Start Date End Date Taking? Authorizing Provider  bacitracin ointment Apply 1 application topically 2 (two) times daily. 11/29/13   Antonietta Breach, PA-C  cyclobenzaprine (FLEXERIL) 10 MG tablet Take 1 tablet (10 mg total) by mouth 2 (two) times daily as needed for muscle spasms. 10/11/13   Glendell Docker, NP  traMADol (ULTRAM) 50 MG tablet Take 1 tablet (50 mg total) by mouth every 6 (six) hours as needed. 10/11/13   Glendell Docker, NP   Triage Vitals: BP 175/116 mmHg  Pulse 88  Temp(Src) 98 F (36.7 C) (Oral)  Resp 16  Wt 217 lb (98.431 kg)  SpO2 99% Physical Exam  Constitutional: She is oriented to person, place, and time. She appears well-developed and well-nourished. No distress.  HENT:  Head: Normocephalic and atraumatic.  Eyes: Conjunctivae and EOM are normal. No scleral icterus.  Neck: Normal range of motion.  Cardiovascular: Normal rate, regular rhythm and intact distal pulses.   Distal radial pulse 2+ in right upper extremity. Capillary refill brisk in all digits of right hand.  Pulmonary/Chest: Effort normal. No respiratory distress.  Musculoskeletal: Normal range of motion.       Right hand: She exhibits tenderness and laceration. She exhibits normal range of motion, no bony tenderness, normal capillary  refill and no deformity. Normal sensation noted. Normal strength noted.       Hands: 2 cm curved laceration noted to the ulnar aspect of the R hand, proximal to the 5th digit. No foreign bodies appreciated. Bleeding controlled.  Neurological: She is alert and oriented to person, place, and time. She exhibits normal muscle tone. Coordination normal.  Sensation to light touch intact.  Skin: Skin is warm and dry. No rash noted. She is not diaphoretic. No erythema. No pallor.  Psychiatric: She has a normal mood and affect. Her behavior is normal.  Nursing note and vitals reviewed.   ED Course  Procedures (including critical care time) DIAGNOSTIC STUDIES: Oxygen Saturation is 99% on RA, normal by my interpretation.   COORDINATION OF CARE: 8:43 PM- Will update tetanus vaccination and suture laceration. Pt verbalizes understanding and agrees to plan.  LACERATION REPAIR PROCEDURE NOTE The patient's identification was confirmed and consent was obtained. This procedure was performed by Antonietta Breach, PA-C at 9:10 PM. Site: ulnar aspect right hand Sterile procedures observed Anesthetic used (type and amt): Lidocaine 1% without Epinephrine (3 mLs) Suture type/size: 5-0 Prolene Length: 2 cm # of Sutures: 4 Technique: simple, interrupted Complexity: simple Antibx ointment applied Tetanus ordered Site anesthetized, irrigated with NS, explored without evidence of foreign body, wound well approximated, site covered with dry, sterile dressing.  Patient tolerated procedure well without complications. Instructions for care discussed verbally and patient provided with additional written instructions for homecare and f/u.  Medications  lidocaine (XYLOCAINE) 1 % (with pres) injection 10 mL (10 mLs Infiltration Given by Other 11/29/13 2115)  Tdap (BOOSTRIX) injection 0.5 mL (0.5 mLs Intramuscular Given 11/29/13 2051)    Labs Review Labs Reviewed - No data to display  Imaging Review No results  found.   EKG Interpretation None      MDM   Final diagnoses:  Hand laceration, right, initial encounter    Tdap booster given. Laceration occurred < 8 hours prior to repair which was well tolerated. Pt has no comorbidities to effect normal wound healing. Discussed suture home care with pt and answered questions. Pt to follow up for wound check and suture removal in 10 days. Pt is hemodynamically stable with no complaints prior to discharge.    I personally performed the services described in this documentation, which was scribed in my presence. The recorded information has been reviewed and is accurate.   Antonietta Breach, PA-C 11/29/13 [place], MD 11/30/13 [phone]

## 2013-11-29 NOTE — Discharge Instructions (Signed)

## 2013-11-29 NOTE — ED Notes (Signed)
Pt alert, arrives from home, c/o laceration to right hand, onset was this evening, bleeding stopped, DSD applied

## 2013-12-11 ENCOUNTER — Emergency Department (HOSPITAL_COMMUNITY)
Admission: EM | Admit: 2013-12-11 | Discharge: 2013-12-11 | Disposition: A | Discharge status: Home / Self Care | Payer: BC Managed Care – PPO | Attending: Emergency Medicine | Admitting: Emergency Medicine

## 2013-12-11 ENCOUNTER — Encounter (HOSPITAL_COMMUNITY): Payer: Self-pay | Admitting: Emergency Medicine

## 2013-12-11 DIAGNOSIS — Z792 Long term (current) use of antibiotics: Secondary | ICD-10-CM | POA: Diagnosis not present

## 2013-12-11 DIAGNOSIS — Z79899 Other long term (current) drug therapy: Secondary | ICD-10-CM | POA: Diagnosis not present

## 2013-12-11 DIAGNOSIS — Z4802 Encounter for removal of sutures: Secondary | ICD-10-CM | POA: Diagnosis present

## 2013-12-11 DIAGNOSIS — G4730 Sleep apnea, unspecified: Secondary | ICD-10-CM | POA: Insufficient documentation

## 2013-12-11 DIAGNOSIS — Z8739 Personal history of other diseases of the musculoskeletal system and connective tissue: Secondary | ICD-10-CM | POA: Insufficient documentation

## 2013-12-11 NOTE — ED Notes (Signed)
Pt here for stitches removal from right hand.

## 2013-12-11 NOTE — ED Provider Notes (Signed)
CSN: [id number]     Arrival date & time 12/11/13  7628 History   First MD Initiated Contact with Patient 12/11/13 [phone]     Chief Complaint  Patient presents with  . Suture / Staple Removal     (Consider location/radiation/quality/duration/timing/severity/associated sxs/prior Treatment) HPI Comments: Patient presents for suture removal. The laceration is located on the right hand. The patient reports subjective healing of the laceration and denies any complications or concerns. The patient denies any pain, erythema, tenderness, drainage of the site. Denies fever, NVD, abdominal pain, numbness/tingling, skin color changes.    Patient is a 63 y.o. female presenting with suture removal. The history is provided by the patient. No language interpreter was used.  Suture / Staple Removal This is a new problem. The current episode started 1 to 4 weeks ago. The problem occurs constantly. The problem has been unchanged. Pertinent negatives include no numbness or rash. Nothing aggravates the symptoms. She has tried nothing for the symptoms. The treatment provided significant relief.    Past Medical History  Diagnosis Date  . PONV (postoperative nausea and vomiting)   . Wears glasses   . Sleep apnea     used to use a cpap-dr domieur told her she did bot need one any more  . Seizures     last seizure 2004  3 total 10 yrs apart  . Back pain    Past Surgical History  Procedure Laterality Date  . Cholecystectomy    . Lumbar laminectomy/decompression microdiscectomy Right 08/27/2012    Procedure: RIGHT  LUMBAR FOUR FIVE LAMINECTOMY/DECOMPRESSION MICRODISCECTOMY 1 LEVEL;  Surgeon: Elaina Hoops, MD;  Location: [facility];  Service: Neurosurgery;  Laterality: Right;  . Breast surgery  2013    biopsy-lt  . Abdominal hysterectomy  1991    partial hysterectomy  . Colonoscopy    . Upper gi endoscopy    . Breast biopsy Left 10/29/2012    Procedure:  LEFT BREAST WIRE GUIDED EXCISION;  Surgeon: Rolm Bookbinder, MD;  Location: [facility];  Service: General;  Laterality: Left;   Family History  Problem Relation Age of Onset  . Asthma Mother   . Cancer Mother     renal cell carcinoma  . Cancer Father     melanoma on face  . Diabetes Maternal Grandmother    History  Substance Use Topics  . Smoking status: Never Smoker   . Smokeless tobacco: Never Used  . Alcohol Use: No   OB History    No data available     Review of Systems  Skin: Positive for wound. Negative for rash.  Neurological: Negative for numbness.  All other systems reviewed and are negative.     Allergies  Hydrocodone and Codeine  Home Medications   Prior to Admission medications   Medication Sig Start Date End Date Taking? Authorizing Provider  bacitracin ointment Apply 1 application topically 2 (two) times daily. 11/29/13   Antonietta Breach, PA-C  cyclobenzaprine (FLEXERIL) 10 MG tablet Take 1 tablet (10 mg total) by mouth 2 (two) times daily as needed for muscle spasms. 10/11/13   Glendell Docker, NP  traMADol (ULTRAM) 50 MG tablet Take 1 tablet (50 mg total) by mouth every 6 (six) hours as needed. 10/11/13   Glendell Docker, NP   BP 169/89 mmHg  Pulse 82  Temp(Src) 98 F (36.7 C) (Oral)  Resp 20  SpO2 96% Physical Exam  Constitutional: She is oriented to person, place, and time. She appears well-developed  and well-nourished. No distress.  HENT:  Head: Normocephalic and atraumatic.  Eyes: Conjunctivae and EOM are normal.  Neck: Normal range of motion.  Cardiovascular: Normal rate and regular rhythm.  Exam reveals no gallop and no friction rub.   No murmur heard. Pulmonary/Chest: Effort normal and breath sounds normal. She has no wheezes. She has no rales. She exhibits no tenderness.  Abdominal: Soft. There is no tenderness.  Musculoskeletal: Normal range of motion.  Neurological: She is alert and oriented to person, place, and time. Coordination normal.  Speech is goal-oriented. Moves  limbs without ataxia.   Skin: Skin is warm and dry.  Well healed laceration with 4 sutures intact of right ulnar aspect of hand. No drainage or erythema noted.   Psychiatric: She has a normal mood and affect. Her behavior is normal.  Nursing note and vitals reviewed.   ED Course  Procedures (including critical care time) Labs Review Labs Reviewed - No data to display  SUTURE REMOVAL Performed by: Alvina Chou  Consent: Verbal consent obtained. Patient identity confirmed: provided demographic data Time out: Immediately prior to procedure a "time out" was called to verify the correct patient, procedure, equipment, support staff and site/side marked as required.  Location details: right ulnar aspect of hand  Wound Appearance: clean  Sutures/Staples Removed: 4  Facility: sutures placed in this facility Patient tolerance: Patient tolerated the procedure well with no immediate complications.     Imaging Review No results found.   EKG Interpretation None      MDM   Final diagnoses:  Visit for suture removal    9:26 AM Sutures removed without difficulty. Wound is well healed without signs of infection.     Alvina Chou, PA-C 12/11/13 [place], MD 12/11/13 1721

## 2014-02-17 ENCOUNTER — Other Ambulatory Visit: Payer: Self-pay | Admitting: Gastroenterology

## 2014-03-04 ENCOUNTER — Other Ambulatory Visit: Payer: Self-pay | Admitting: General Surgery

## 2014-03-04 ENCOUNTER — Other Ambulatory Visit: Payer: Self-pay

## 2014-03-04 DIAGNOSIS — Z1231 Encounter for screening mammogram for malignant neoplasm of breast: Secondary | ICD-10-CM

## 2014-03-12 ENCOUNTER — Ambulatory Visit
Admission: RE | Admit: 2014-03-12 | Discharge: 2014-03-12 | Disposition: A | Discharge status: Home / Self Care | Payer: Medicare Other | Source: Ambulatory Visit

## 2014-03-12 DIAGNOSIS — Z1231 Encounter for screening mammogram for malignant neoplasm of breast: Secondary | ICD-10-CM

## 2015-10-07 ENCOUNTER — Other Ambulatory Visit: Payer: Self-pay | Admitting: Family Medicine

## 2015-10-07 DIAGNOSIS — Z1231 Encounter for screening mammogram for malignant neoplasm of breast: Secondary | ICD-10-CM

## 2015-10-21 ENCOUNTER — Ambulatory Visit
Admission: RE | Admit: 2015-10-21 | Discharge: 2015-10-21 | Disposition: A | Discharge status: Home / Self Care | Payer: BC Managed Care – PPO | Source: Ambulatory Visit | Attending: Family Medicine | Admitting: Family Medicine

## 2015-10-21 DIAGNOSIS — Z1231 Encounter for screening mammogram for malignant neoplasm of breast: Secondary | ICD-10-CM

## 2016-01-04 DIAGNOSIS — C50919 Malignant neoplasm of unspecified site of unspecified female breast: Secondary | ICD-10-CM

## 2016-01-04 HISTORY — DX: Malignant neoplasm of unspecified site of unspecified female breast: C50.919

## 2016-11-01 ENCOUNTER — Other Ambulatory Visit: Payer: Self-pay | Admitting: Family Medicine

## 2016-11-01 DIAGNOSIS — Z1231 Encounter for screening mammogram for malignant neoplasm of breast: Secondary | ICD-10-CM

## 2016-11-22 ENCOUNTER — Ambulatory Visit
Admission: RE | Admit: 2016-11-22 | Discharge: 2016-11-22 | Disposition: A | Discharge status: Home / Self Care | Payer: Medicare Other | Source: Ambulatory Visit | Attending: Family Medicine | Admitting: Family Medicine

## 2016-11-22 DIAGNOSIS — Z1231 Encounter for screening mammogram for malignant neoplasm of breast: Secondary | ICD-10-CM

## 2016-11-23 ENCOUNTER — Other Ambulatory Visit: Payer: Self-pay | Admitting: Family Medicine

## 2016-11-23 DIAGNOSIS — R928 Other abnormal and inconclusive findings on diagnostic imaging of breast: Secondary | ICD-10-CM

## 2016-11-30 ENCOUNTER — Ambulatory Visit
Admission: RE | Admit: 2016-11-30 | Discharge: 2016-11-30 | Disposition: A | Discharge status: Home / Self Care | Payer: Medicare Other | Source: Ambulatory Visit | Attending: Family Medicine | Admitting: Family Medicine

## 2016-11-30 ENCOUNTER — Other Ambulatory Visit: Payer: Self-pay | Admitting: Family Medicine

## 2016-11-30 DIAGNOSIS — N6489 Other specified disorders of breast: Secondary | ICD-10-CM

## 2016-11-30 DIAGNOSIS — R928 Other abnormal and inconclusive findings on diagnostic imaging of breast: Secondary | ICD-10-CM

## 2016-12-02 ENCOUNTER — Other Ambulatory Visit: Payer: Self-pay | Admitting: Family Medicine

## 2016-12-02 ENCOUNTER — Ambulatory Visit
Admission: RE | Admit: 2016-12-02 | Discharge: 2016-12-02 | Disposition: A | Discharge status: Home / Self Care | Payer: Medicare Other | Source: Ambulatory Visit | Attending: Family Medicine | Admitting: Family Medicine

## 2016-12-02 DIAGNOSIS — N6489 Other specified disorders of breast: Secondary | ICD-10-CM

## 2016-12-05 ENCOUNTER — Other Ambulatory Visit: Payer: Self-pay | Admitting: Family Medicine

## 2016-12-05 DIAGNOSIS — N63 Unspecified lump in unspecified breast: Secondary | ICD-10-CM

## 2016-12-05 DIAGNOSIS — N6489 Other specified disorders of breast: Secondary | ICD-10-CM

## 2016-12-07 ENCOUNTER — Other Ambulatory Visit: Payer: Self-pay | Admitting: General Surgery

## 2016-12-07 ENCOUNTER — Ambulatory Visit
Admission: RE | Admit: 2016-12-07 | Discharge: 2016-12-07 | Disposition: A | Discharge status: Home / Self Care | Payer: Medicare Other | Source: Ambulatory Visit | Attending: Family Medicine | Admitting: Family Medicine

## 2016-12-07 DIAGNOSIS — Z170 Estrogen receptor positive status [ER+]: Principal | ICD-10-CM

## 2016-12-07 DIAGNOSIS — C50411 Malignant neoplasm of upper-outer quadrant of right female breast: Secondary | ICD-10-CM

## 2016-12-07 DIAGNOSIS — N6489 Other specified disorders of breast: Secondary | ICD-10-CM

## 2016-12-08 ENCOUNTER — Other Ambulatory Visit: Payer: Medicare Other

## 2016-12-09 ENCOUNTER — Encounter (HOSPITAL_BASED_OUTPATIENT_CLINIC_OR_DEPARTMENT_OTHER): Payer: Self-pay | Admitting: *Deleted

## 2016-12-14 ENCOUNTER — Ambulatory Visit: Payer: Medicare Other | Attending: General Surgery | Admitting: Physical Therapy

## 2016-12-14 ENCOUNTER — Other Ambulatory Visit: Payer: Self-pay

## 2016-12-14 DIAGNOSIS — R293 Abnormal posture: Secondary | ICD-10-CM

## 2016-12-14 DIAGNOSIS — M6281 Muscle weakness (generalized): Secondary | ICD-10-CM | POA: Diagnosis present

## 2016-12-14 DIAGNOSIS — M79622 Pain in left upper arm: Secondary | ICD-10-CM

## 2016-12-14 NOTE — Progress Notes (Signed)
Ensure pre surgery drink given with instructions to complete by 0400 dos, pt verbalized understanding.

## 2016-12-14 NOTE — Patient Instructions (Addendum)
1. Decompression Exercise     [facility] [phone]    Lie on back on firm surface, knees bent, feet flat, arms turned up, out to sides, backs of hands down. Time _5-15__ minutes. Surface: floor   2. Shoulder Press    Start in Decompression Exercise position. Press shoulders downward towards supporting surface. Hold __2-3__ seconds while counting out loud. Repeat _3-5___ times. Do _1-2___ times per day.   3. Head Press    Bring cervical spine (neck) into neutral position (by either tucking the chin towards the chest or tilting the chin upward). Feel weight on back of head. Press head downward into supporting surface.    Hold _2-3__ seconds. Repeat _3-5__ times. Do _1-2__ times per day.   4. Leg Lengthener    Straighten one leg. Pull toes AND forefoot toward knee, extend heel. Lengthen leg by pulling pelvis away from ribs. Hold _2-3__ seconds. Relax. Repeat __4-6__ times. Do other leg.  Surface: floor   5. Leg Press    Straighten one leg down to floor keeping leg aligned with hip. Pull toes AND forefoot toward knee; extend heel.  Press entire leg downward (as if pressing leg into sandy beach). DO NOT BEND KNEE. Hold _2-3__ seconds. Do __4-6__ times. Repeat with other leg.

## 2016-12-15 NOTE — Therapy (Signed)
[place], [place], [address] Phone: [phone]   Fax:  [phone]  Physical Therapy Evaluation  Patient Details  Name: Virginia Lindsey MRN: [id number] Date of Birth: May 14, 1950 Referring Provider: Dr. Donne Hazel    Encounter Date: 12/14/2016  PT End of Session - 12/14/16 1557    Visit Number  1    Number of Visits  1    PT Start Time  1324    PT Stop Time  1430    PT Time Calculation (min)  45 min    Activity Tolerance  Patient tolerated treatment well    Behavior During Therapy  [facility] for tasks assessed/performed       Past Medical History:  Diagnosis Date  . Back pain   . PONV (postoperative nausea and vomiting)   . Seizures ([place])    last seizure 2004  3 total 10 yrs apart  . Wears glasses     Past Surgical History:  Procedure Laterality Date  . ABDOMINAL HYSTERECTOMY  1991   partial hysterectomy  . BREAST BIOPSY Left 10/29/2012   Procedure:  LEFT BREAST WIRE GUIDED EXCISION;  Surgeon: Rolm Bookbinder, MD;  Location: [facility];  Service: General;  Laterality: Left;  . BREAST EXCISIONAL BIOPSY    . BREAST SURGERY  2013   biopsy-lt  . CHOLECYSTECTOMY    . COLONOSCOPY    . LUMBAR LAMINECTOMY/DECOMPRESSION MICRODISCECTOMY Right 08/27/2012   Procedure: RIGHT  LUMBAR FOUR FIVE LAMINECTOMY/DECOMPRESSION MICRODISCECTOMY 1 LEVEL;  Surgeon: Elaina Hoops, MD;  Location: [facility];  Service: Neurosurgery;  Laterality: Right;  . UPPER GI ENDOSCOPY      There were no vitals filed for this visit.   Subjective Assessment - 12/14/16 1408    Subjective  "I"m having surgery on Monday for left breast cancer." She says she will probably have radiation too.     Pertinent History  left breast cancer.   stress related seizures that are no longer a factor.  She has ache in lateral left arm that she has had for a while.  She also has a "catch" in her right hip. She sleeps on elevated  pillows as she has trouble with sleep apnea in the past     Patient Stated Goals  to learn exercises to do post op     Currently in Pain?  Yes    Pain Score  3     Pain Location  Arm    Pain Orientation  Left    Pain Descriptors / Indicators  Aching    Pain Type  Chronic pain    Pain Radiating Towards  no     Pain Onset  More than a month ago    Pain Frequency  Intermittent diminshes     Aggravating Factors   -- just comes and goes             LYMPHEDEMA/ONCOLOGY QUESTIONNAIRE - 12/14/16 1426      Right Upper Extremity Lymphedema   10 cm Proximal to Olecranon Process  37 cm    Olecranon Process  27.7 cm    10 cm Proximal to Ulnar Styloid Process  25 cm    Just Proximal to Ulnar Styloid Process  17 cm    Across Hand at [employer]  19 cm    At [place] of 2nd Digit  5.8 cm      Left Upper Extremity Lymphedema   10 cm Proximal to Olecranon  Process  35.5 cm    Olecranon Process  27.7 cm    10 cm Proximal to Ulnar Styloid Process  24 cm    Just Proximal to Ulnar Styloid Process  16.5 cm    Across Hand at [employer]  18.5 cm    At [place] of 2nd Digit  5.6 cm          Objective measurements completed on examination: See above findings.      [facility] Adult PT Treatment/Exercise - 12/15/16 0001      Exercises   Exercises  Shoulder      Shoulder Exercises: Supine   Other Supine Exercises  meeks decompression to help with posture and possibly decrease shoulder pain       Shoulder Exercises: Seated   Other Seated Exercises  post op shoulder exercises.  Pt eduated to keep arms moving below shoulder level until drains are out and progress to overhead activity as tolerated with OK from MD              PT Education - 12/15/16 0959    Education provided  Yes    Education Details  post op shoulder exercise, postural exercise and encouraged pt to come to [facility] class     Person(s) Educated  Patient    Methods  Explanation;Handout;Demonstration    Comprehension   Verbalized understanding;Returned demonstration           Breast Clinic Goals - 12/15/16 1004      Patient will be able to verbalize understanding of pertinent lymphedema risk reduction practices relevant to her diagnosis specifically related to skin care.   Time  1    Period  Days    Status  Achieved      Patient will be able to return demonstrate and/or verbalize understanding of the post-op home exercise program related to regaining shoulder range of motion.   Time  1    Period  Days    Status  Achieved      Patient will be able to verbalize understanding of the importance of attending the postoperative After Breast Cancer Class for further lymphedema risk reduction education and therapeutic exercise.   Time  1    Period  Days    Status  Achieved            Plan - 12/15/16 1000    Clinical Impression Statement  Pt was seen for preop breast cancer surgery visit and baseling measurements were takesn  She was instructed in post op exercise and encouraged to come to [facility] class for lymphedema risk reduction education once she has recovered from surgery.  She does have chronic pain in shoulder and was encouraged to ask dr. to come for continued PT if she is not regaining her strength or range of motion as she would expect after surgery     History and Personal Factors relevant to plan of care:  previous joint pain without diagnosis of cause     Clinical Presentation  Stable    Clinical Decision Making  Low    Rehab Potential  Good    PT Frequency  One time visit    PT Treatment/Interventions  Patient/family education;Therapeutic exercise    PT Next Visit Plan  Re-evaluation post op with establishment of treatment goals at the time for post op rehab     Consulted and Agree with Plan of Care  Patient       Patient will benefit from skilled therapeutic intervention  in order to improve the following deficits and impairments:  Decreased knowledge of precautions, Postural  dysfunction, Pain, Decreased activity tolerance  Visit Diagnosis: Abnormal posture - Plan: PT plan of care cert/re-cert  Pain in left upper arm - Plan: PT plan of care cert/re-cert  Muscle weakness (generalized) - Plan: PT plan of care cert/re-cert  G-Codes - 46/95/07 1557    Functional Assessment Tool Used (Outpatient Only)  clinical judgement     Functional Limitation  Carrying, moving and handling objects    Carrying, Moving and Handling Objects Current Status (K2575)  At least 20 percent but less than 40 percent impaired, limited or restricted    Carrying, Moving and Handling Objects Goal Status (Y5183)  At least 20 percent but less than 40 percent impaired, limited or restricted    Carrying, Moving and Handling Objects Discharge Status [phone])  At least 20 percent but less than 40 percent impaired, limited or restricted        Problem List Patient Active Problem List   Diagnosis Date Noted  . Thyroid cyst 10/18/2012   Donato Heinz. Owens Shark PT  Norwood Levo 12/15/2016, 10:06 AM  [place], [place], [address] Phone: [phone]   Fax:  [phone]  Name: Virginia Lindsey MRN: [id number] Date of Birth: Apr 28, 1950

## 2016-12-16 ENCOUNTER — Ambulatory Visit
Admission: RE | Admit: 2016-12-16 | Discharge: 2016-12-16 | Disposition: A | Discharge status: Home / Self Care | Payer: Medicare Other | Source: Ambulatory Visit | Attending: General Surgery | Admitting: General Surgery

## 2016-12-16 ENCOUNTER — Telehealth: Payer: Self-pay | Admitting: Hematology and Oncology

## 2016-12-16 ENCOUNTER — Encounter (INDEPENDENT_AMBULATORY_CARE_PROVIDER_SITE_OTHER): Payer: Self-pay

## 2016-12-16 DIAGNOSIS — C50411 Malignant neoplasm of upper-outer quadrant of right female breast: Secondary | ICD-10-CM

## 2016-12-16 DIAGNOSIS — Z170 Estrogen receptor positive status [ER+]: Principal | ICD-10-CM

## 2016-12-16 NOTE — Telephone Encounter (Signed)
Spoke with patient re new appointment with Dr. Lindi Adie 12/26. Dem[employer]/insurance information confirmed. Patient given date/time/location.

## 2016-12-18 NOTE — Anesthesia Preprocedure Evaluation (Addendum)
Anesthesia Evaluation  Patient identified by MRN, date of birth, ID band Patient awake    Reviewed: Allergy & Precautions, H&P , NPO status , Patient's Chart, lab work & pertinent test results  History of Anesthesia Complications (+) PONV and history of anesthetic complications  Airway Mallampati: II  TM Distance: >3 FB Neck ROM: Full    Dental no notable dental hx.    Pulmonary sleep apnea ,    Pulmonary exam normal breath sounds clear to auscultation       Cardiovascular negative cardio ROS Normal cardiovascular exam Rhythm:Regular Rate:Normal     Neuro/Psych Seizures -,     GI/Hepatic negative GI ROS, Neg liver ROS,   Endo/Other  negative endocrine ROS  Renal/GU negative Renal ROS     Musculoskeletal   Abdominal   Peds  Hematology negative hematology ROS (+)   Anesthesia Other Findings   Reproductive/Obstetrics                            Anesthesia Physical  Anesthesia Plan  ASA: II  Anesthesia Plan: General   Post-op Pain Management: GA combined w/ Regional for post-op pain   Induction: Intravenous  PONV Risk Score and Plan: 3 and Dexamethasone, Treatment may vary due to age or medical condition, Ondansetron and Midazolam  Airway Management Planned: LMA and Oral ETT  Additional Equipment:   Intra-op Plan:   Post-operative Plan: Extubation in OR  Informed Consent: I have reviewed the patients History and Physical, chart, labs and discussed the procedure including the risks, benefits and alternatives for the proposed anesthesia with the patient or authorized representative who has indicated his/her understanding and acceptance.   Dental advisory given  Plan Discussed with: CRNA, Anesthesiologist and Surgeon  Anesthesia Plan Comments: (  )       Anesthesia Quick Evaluation

## 2016-12-19 ENCOUNTER — Ambulatory Visit (HOSPITAL_COMMUNITY)
Admission: RE | Admit: 2016-12-19 | Discharge: 2016-12-19 | Disposition: A | Discharge status: Home / Self Care | Payer: Medicare Other | Source: Ambulatory Visit | Attending: General Surgery | Admitting: General Surgery

## 2016-12-19 ENCOUNTER — Other Ambulatory Visit: Payer: Self-pay

## 2016-12-19 ENCOUNTER — Encounter (HOSPITAL_BASED_OUTPATIENT_CLINIC_OR_DEPARTMENT_OTHER): Payer: Self-pay | Admitting: Registered Nurse

## 2016-12-19 ENCOUNTER — Ambulatory Visit (HOSPITAL_BASED_OUTPATIENT_CLINIC_OR_DEPARTMENT_OTHER): Payer: Medicare Other | Admitting: Anesthesiology

## 2016-12-19 ENCOUNTER — Encounter (HOSPITAL_BASED_OUTPATIENT_CLINIC_OR_DEPARTMENT_OTHER)
Admission: RE | Disposition: A | Discharge status: Home / Self Care | Payer: Self-pay | Source: Ambulatory Visit | Attending: General Surgery

## 2016-12-19 ENCOUNTER — Ambulatory Visit
Admission: RE | Admit: 2016-12-19 | Discharge: 2016-12-19 | Disposition: A | Discharge status: Home / Self Care | Payer: Medicare Other | Source: Ambulatory Visit | Attending: General Surgery | Admitting: General Surgery

## 2016-12-19 ENCOUNTER — Ambulatory Visit (HOSPITAL_BASED_OUTPATIENT_CLINIC_OR_DEPARTMENT_OTHER)
Admission: RE | Admit: 2016-12-19 | Discharge: 2016-12-19 | Disposition: A | Discharge status: Home / Self Care | Payer: Medicare Other | Source: Ambulatory Visit | Attending: General Surgery | Admitting: General Surgery

## 2016-12-19 DIAGNOSIS — Z170 Estrogen receptor positive status [ER+]: Secondary | ICD-10-CM | POA: Insufficient documentation

## 2016-12-19 DIAGNOSIS — Z86010 Personal history of colonic polyps: Secondary | ICD-10-CM | POA: Insufficient documentation

## 2016-12-19 DIAGNOSIS — Z833 Family history of diabetes mellitus: Secondary | ICD-10-CM | POA: Diagnosis not present

## 2016-12-19 DIAGNOSIS — C50412 Malignant neoplasm of upper-outer quadrant of left female breast: Secondary | ICD-10-CM | POA: Diagnosis not present

## 2016-12-19 DIAGNOSIS — Z841 Family history of disorders of kidney and ureter: Secondary | ICD-10-CM | POA: Diagnosis not present

## 2016-12-19 DIAGNOSIS — N6489 Other specified disorders of breast: Secondary | ICD-10-CM | POA: Diagnosis not present

## 2016-12-19 DIAGNOSIS — Z90710 Acquired absence of both cervix and uterus: Secondary | ICD-10-CM | POA: Diagnosis not present

## 2016-12-19 DIAGNOSIS — Z800 Family history of malignant neoplasm of digestive organs: Secondary | ICD-10-CM | POA: Insufficient documentation

## 2016-12-19 DIAGNOSIS — Z809 Family history of malignant neoplasm, unspecified: Secondary | ICD-10-CM | POA: Diagnosis not present

## 2016-12-19 DIAGNOSIS — Z885 Allergy status to narcotic agent status: Secondary | ICD-10-CM | POA: Insufficient documentation

## 2016-12-19 DIAGNOSIS — Z9049 Acquired absence of other specified parts of digestive tract: Secondary | ICD-10-CM | POA: Insufficient documentation

## 2016-12-19 DIAGNOSIS — Z803 Family history of malignant neoplasm of breast: Secondary | ICD-10-CM | POA: Diagnosis not present

## 2016-12-19 DIAGNOSIS — Z9889 Other specified postprocedural states: Secondary | ICD-10-CM | POA: Insufficient documentation

## 2016-12-19 DIAGNOSIS — C50411 Malignant neoplasm of upper-outer quadrant of right female breast: Secondary | ICD-10-CM

## 2016-12-19 HISTORY — PX: BREAST LUMPECTOMY: SHX2

## 2016-12-19 HISTORY — PX: BREAST LUMPECTOMY WITH RADIOACTIVE SEED AND SENTINEL LYMPH NODE BIOPSY: SHX6550

## 2016-12-19 SURGERY — BREAST LUMPECTOMY WITH RADIOACTIVE SEED AND SENTINEL LYMPH NODE BIOPSY
Anesthesia: General | Site: Breast | Laterality: Left

## 2016-12-19 MED ORDER — TECHNETIUM TC 99M SULFUR COLLOID FILTERED
1.0000 | Freq: Once | INTRAVENOUS | Status: AC | PRN
  Administered 2016-12-19: 1 via INTRADERMAL

## 2016-12-19 MED ORDER — FENTANYL CITRATE (PF) 100 MCG/2ML IJ SOLN
50.0000 ug | INTRAMUSCULAR | Status: DC | PRN
  Administered 2016-12-19: 100 ug via INTRAVENOUS

## 2016-12-19 MED ORDER — CEFAZOLIN SODIUM-DEXTROSE 2-4 GM/100ML-% IV SOLN
INTRAVENOUS | Status: AC
  Filled 2016-12-19: qty 100

## 2016-12-19 MED ORDER — BUPIVACAINE HCL (PF) 0.25 % IJ SOLN
INTRAMUSCULAR | Status: DC | PRN
  Administered 2016-12-19: 3 mL

## 2016-12-19 MED ORDER — CEFAZOLIN SODIUM-DEXTROSE 2-4 GM/100ML-% IV SOLN
2.0000 g | INTRAVENOUS | Status: AC
  Administered 2016-12-19: 2 g via INTRAVENOUS

## 2016-12-19 MED ORDER — ONDANSETRON HCL 4 MG PO TABS: 4.0000 mg | ORAL_TABLET | Freq: Every day | ORAL | 0 refills | Status: DC | PRN

## 2016-12-19 MED ORDER — ACETAMINOPHEN 500 MG PO TABS
ORAL_TABLET | ORAL | Status: AC
  Filled 2016-12-19: qty 2

## 2016-12-19 MED ORDER — MIDAZOLAM HCL 2 MG/2ML IJ SOLN
1.0000 mg | INTRAMUSCULAR | Status: DC | PRN
  Administered 2016-12-19: 2 mg via INTRAVENOUS

## 2016-12-19 MED ORDER — MIDAZOLAM HCL 2 MG/2ML IJ SOLN
INTRAMUSCULAR | Status: AC
Start: 2016-12-19 — End: ?
  Filled 2016-12-19: qty 2

## 2016-12-19 MED ORDER — FENTANYL CITRATE (PF) 100 MCG/2ML IJ SOLN
INTRAMUSCULAR | Status: AC
  Filled 2016-12-19: qty 2

## 2016-12-19 MED ORDER — PROPOFOL 10 MG/ML IV BOLUS
INTRAVENOUS | Status: AC
  Filled 2016-12-19: qty 20

## 2016-12-19 MED ORDER — MEPERIDINE HCL 25 MG/ML IJ SOLN: 6.2500 mg | INTRAMUSCULAR | Status: DC | PRN

## 2016-12-19 MED ORDER — LIDOCAINE 2% (20 MG/ML) 5 ML SYRINGE
INTRAMUSCULAR | Status: AC
  Filled 2016-12-19: qty 5

## 2016-12-19 MED ORDER — ONDANSETRON HCL 4 MG/2ML IJ SOLN: 4.0000 mg | Freq: Once | INTRAMUSCULAR | Status: DC | PRN

## 2016-12-19 MED ORDER — SCOPOLAMINE 1 MG/3DAYS TD PT72
1.0000 | MEDICATED_PATCH | Freq: Once | TRANSDERMAL | Status: DC | PRN
  Administered 2016-12-19: 1.5 mg via TRANSDERMAL

## 2016-12-19 MED ORDER — DEXAMETHASONE SODIUM PHOSPHATE 10 MG/ML IJ SOLN
INTRAMUSCULAR | Status: DC | PRN
  Administered 2016-12-19: 10 mg via INTRAVENOUS

## 2016-12-19 MED ORDER — ROPIVACAINE HCL 7.5 MG/ML IJ SOLN
INTRAMUSCULAR | Status: DC | PRN
  Administered 2016-12-19: 30 mL via PERINEURAL

## 2016-12-19 MED ORDER — DEXAMETHASONE SODIUM PHOSPHATE 10 MG/ML IJ SOLN
INTRAMUSCULAR | Status: AC
  Filled 2016-12-19: qty 1

## 2016-12-19 MED ORDER — EPHEDRINE SULFATE-NACL 50-0.9 MG/10ML-% IV SOSY
PREFILLED_SYRINGE | INTRAVENOUS | Status: DC | PRN
  Administered 2016-12-19: 10 mg via INTRAVENOUS
  Administered 2016-12-19: 20 mg via INTRAVENOUS
  Administered 2016-12-19: 10 mg via INTRAVENOUS

## 2016-12-19 MED ORDER — LIDOCAINE 2% (20 MG/ML) 5 ML SYRINGE
INTRAMUSCULAR | Status: DC | PRN
  Administered 2016-12-19: 100 mg via INTRAVENOUS

## 2016-12-19 MED ORDER — EPHEDRINE 5 MG/ML INJ
INTRAVENOUS | Status: AC
  Filled 2016-12-19: qty 10

## 2016-12-19 MED ORDER — GABAPENTIN 300 MG PO CAPS
300.0000 mg | ORAL_CAPSULE | ORAL | Status: AC
  Administered 2016-12-19: 300 mg via ORAL

## 2016-12-19 MED ORDER — PROPOFOL 10 MG/ML IV BOLUS
INTRAVENOUS | Status: DC | PRN
  Administered 2016-12-19: 200 mg via INTRAVENOUS

## 2016-12-19 MED ORDER — ACETAMINOPHEN 500 MG PO TABS
1000.0000 mg | ORAL_TABLET | ORAL | Status: AC
  Administered 2016-12-19: 1000 mg via ORAL

## 2016-12-19 MED ORDER — GABAPENTIN 300 MG PO CAPS
ORAL_CAPSULE | ORAL | Status: AC
  Filled 2016-12-19: qty 1

## 2016-12-19 MED ORDER — LACTATED RINGERS IV SOLN
INTRAVENOUS | Status: DC
  Administered 2016-12-19 (×2): via INTRAVENOUS

## 2016-12-19 MED ORDER — ONDANSETRON HCL 4 MG/2ML IJ SOLN
INTRAMUSCULAR | Status: AC
  Filled 2016-12-19: qty 2

## 2016-12-19 MED ORDER — TRAMADOL HCL 50 MG PO TABS: 50.0000 mg | ORAL_TABLET | Freq: Four times a day (QID) | ORAL | 1 refills | Status: DC | PRN

## 2016-12-19 MED ORDER — FENTANYL CITRATE (PF) 100 MCG/2ML IJ SOLN: 25.0000 ug | INTRAMUSCULAR | Status: DC | PRN

## 2016-12-19 MED ORDER — BUPIVACAINE HCL (PF) 0.25 % IJ SOLN
INTRAMUSCULAR | Status: AC
  Filled 2016-12-19: qty 30

## 2016-12-19 MED ORDER — FENTANYL CITRATE (PF) 100 MCG/2ML IJ SOLN
INTRAMUSCULAR | Status: DC | PRN
  Administered 2016-12-19 (×2): 25 ug via INTRAVENOUS
  Administered 2016-12-19: 50 ug via INTRAVENOUS

## 2016-12-19 MED ORDER — SCOPOLAMINE 1 MG/3DAYS TD PT72
MEDICATED_PATCH | TRANSDERMAL | Status: AC
  Filled 2016-12-19: qty 1

## 2016-12-19 MED ORDER — ONDANSETRON HCL 4 MG/2ML IJ SOLN
INTRAMUSCULAR | Status: DC | PRN
  Administered 2016-12-19: 4 mg via INTRAVENOUS

## 2016-12-19 SURGICAL SUPPLY — 66 items
ADH SKN CLS APL DERMABOND .7 (GAUZE/BANDAGES/DRESSINGS) ×1
APPLIER CLIP 9.375 MED OPEN (MISCELLANEOUS) ×3
APR CLP MED 9.3 20 MLT OPN (MISCELLANEOUS) ×1
BINDER BREAST LRG (GAUZE/BANDAGES/DRESSINGS) IMPLANT
BINDER BREAST MEDIUM (GAUZE/BANDAGES/DRESSINGS) IMPLANT
BINDER BREAST XLRG (GAUZE/BANDAGES/DRESSINGS) IMPLANT
BINDER BREAST XXLRG (GAUZE/BANDAGES/DRESSINGS) IMPLANT
BLADE SURG 15 STRL LF DISP TIS (BLADE) ×1 IMPLANT
BLADE SURG 15 STRL SS (BLADE) ×3
CANISTER SUC SOCK COL 7IN (MISCELLANEOUS) IMPLANT
CANISTER SUCT 1200ML W/VALVE (MISCELLANEOUS) ×2 IMPLANT
CHLORAPREP W/TINT 26ML (MISCELLANEOUS) ×3 IMPLANT
CLIP APPLIE 9.375 MED OPEN (MISCELLANEOUS) IMPLANT
CLIP VESOCCLUDE SM WIDE 6/CT (CLIP) ×3 IMPLANT
CLOSURE WOUND 1/2 X4 (GAUZE/BANDAGES/DRESSINGS) ×1
COVER BACK TABLE 60X90IN (DRAPES) ×3 IMPLANT
COVER MAYO STAND STRL (DRAPES) ×3 IMPLANT
COVER PROBE W GEL 5X96 (DRAPES) ×3 IMPLANT
DECANTER SPIKE VIAL GLASS SM (MISCELLANEOUS) IMPLANT
DERMABOND ADVANCED (GAUZE/BANDAGES/DRESSINGS) ×2
DERMABOND ADVANCED .7 DNX12 (GAUZE/BANDAGES/DRESSINGS) ×1 IMPLANT
DEVICE DUBIN W/COMP PLATE 8390 (MISCELLANEOUS) ×3 IMPLANT
DRAPE LAPAROSCOPIC ABDOMINAL (DRAPES) ×3 IMPLANT
DRAPE UTILITY XL STRL (DRAPES) ×3 IMPLANT
ELECT COATED BLADE 2.86 ST (ELECTRODE) ×3 IMPLANT
ELECT REM PT RETURN 9FT ADLT (ELECTROSURGICAL) ×3
ELECTRODE REM PT RTRN 9FT ADLT (ELECTROSURGICAL) ×1 IMPLANT
GLOVE BIO SURGEON STRL SZ7 (GLOVE) ×2 IMPLANT
GLOVE BIOGEL PI IND STRL 7.0 (GLOVE) IMPLANT
GLOVE BIOGEL PI IND STRL 7.5 (GLOVE) ×1 IMPLANT
GLOVE BIOGEL PI INDICATOR 7.0 (GLOVE) ×2
GLOVE BIOGEL PI INDICATOR 7.5 (GLOVE) ×2
GLOVE SURG SS PI 6.0 STRL IVOR (GLOVE) ×2 IMPLANT
GLOVE SURG SS PI 6.5 STRL IVOR (GLOVE) ×2 IMPLANT
GLOVE SURG SS PI 7.0 STRL IVOR (GLOVE) ×2 IMPLANT
GOWN STRL REUS W/ TWL LRG LVL3 (GOWN DISPOSABLE) ×2 IMPLANT
GOWN STRL REUS W/TWL LRG LVL3 (GOWN DISPOSABLE) ×6
HEMOSTAT ARISTA ABSORB 3G PWDR (MISCELLANEOUS) IMPLANT
ILLUMINATOR WAVEGUIDE N/F (MISCELLANEOUS) ×2 IMPLANT
KIT MARKER MARGIN INK (KITS) ×3 IMPLANT
LIGHT WAVEGUIDE WIDE FLAT (MISCELLANEOUS) IMPLANT
NDL HYPO 25X1 1.5 SAFETY (NEEDLE) ×1 IMPLANT
NDL SAFETY ECLIPSE 18X1.5 (NEEDLE) IMPLANT
NEEDLE HYPO 18GX1.5 SHARP (NEEDLE)
NEEDLE HYPO 25X1 1.5 SAFETY (NEEDLE) ×3 IMPLANT
NS IRRIG 1000ML POUR BTL (IV SOLUTION) ×2 IMPLANT
PACK BASIN DAY SURGERY FS (CUSTOM PROCEDURE TRAY) ×3 IMPLANT
PENCIL BUTTON HOLSTER BLD 10FT (ELECTRODE) ×3 IMPLANT
SLEEVE SCD COMPRESS KNEE MED (MISCELLANEOUS) ×3 IMPLANT
SPONGE LAP 4X18 X RAY DECT (DISPOSABLE) ×3 IMPLANT
STRIP CLOSURE SKIN 1/2X4 (GAUZE/BANDAGES/DRESSINGS) ×2 IMPLANT
SUT ETHILON 2 0 FS 18 (SUTURE) IMPLANT
SUT MNCRL AB 4-0 PS2 18 (SUTURE) ×3 IMPLANT
SUT MON AB 5-0 PS2 18 (SUTURE) IMPLANT
SUT SILK 2 0 SH (SUTURE) ×2 IMPLANT
SUT VIC AB 2-0 SH 27 (SUTURE) ×6
SUT VIC AB 2-0 SH 27XBRD (SUTURE) ×1 IMPLANT
SUT VIC AB 3-0 SH 27 (SUTURE) ×3
SUT VIC AB 3-0 SH 27X BRD (SUTURE) ×1 IMPLANT
SUT VIC AB 5-0 PS2 18 (SUTURE) IMPLANT
SYR CONTROL 10ML LL (SYRINGE) ×3 IMPLANT
TOWEL OR 17X24 6PK STRL BLUE (TOWEL DISPOSABLE) ×3 IMPLANT
TOWEL OR NON WOVEN STRL DISP B (DISPOSABLE) ×3 IMPLANT
TUBE CONNECTING 20'X1/4 (TUBING) ×1
TUBE CONNECTING 20X1/4 (TUBING) ×1 IMPLANT
YANKAUER SUCT BULB TIP NO VENT (SUCTIONS) ×2 IMPLANT

## 2016-12-19 NOTE — Anesthesia Procedure Notes (Addendum)
Anesthesia Regional Block: Pectoralis block   Pre-Anesthetic Checklist: ,, timeout performed, Correct Patient, Correct Site, Correct Laterality, Correct Procedure, Correct Position, site marked, Risks and benefits discussed,  Surgical consent,  Pre-op evaluation,  At surgeon's request and post-op pain management  Laterality: Left  Prep: chloraprep       Needles:  Injection technique: Single-shot  Needle Type: Echogenic Stimulator Needle     Needle Length: 5cm  Needle Gauge: 22     Additional Needles:   Procedures:,,,, ultrasound used (permanent image in chart),,,,  Narrative:  Start time: 12/19/2016 7:27 AM End time: 12/19/2016 7:32 AM Injection made incrementally with aspirations every 5 mL.  Performed by: Personally  Anesthesiologist: Janeece Riggers, MD  Additional Notes: Functioning IV was confirmed and monitors were applied.  A 73mm 22ga Arrow echogenic stimulator needle was used. Sterile prep and drape,hand hygiene and sterile gloves were used. Ultrasound guidance: relevant anatomy identified, needle position confirmed, local anesthetic spread visualized around nerve(s)., vascular puncture avoided.  Image printed for medical record. Negative aspiration and negative test dose prior to incremental administration of local anesthetic. The patient tolerated the procedure well.

## 2016-12-19 NOTE — Op Note (Signed)
Preoperative diagnosis: Left breast cancer, clinical stage I Postoperative diagnosis: same as above Procedure:Leftbreast seed guided lumpectomy Left deep axillary sentinel node biopsy Surgeon: Dr Serita Grammes YSA:[id number] Anes: general  Specimens  1.left breast tissue marked with paint 2.leftaxillary sentinel nodes with highest count609 3. Additional left breast posterior margin marked short superior, long lateral, double deep Complications none Drains none Sponge count correct Dispo to pacu stable  Indications: This is a66 yof with history left breast adh who underwent screening mammogram that showed a left breast asymmetry. This appears to be er/pr pos grade I idc with negative nodes. We discussed options and have elected to proceed with lumpectomy and sentinel node biopsy. She had radioactive seed placed prior to beginning and the mammograms were available for review.   Procedure: After informed consent was obtained the patient was taken to the operating room. She first was given technetium in standard periareolar fashion. She had a pectoral block. She was given antibiotics. Sequential compression devices were on her legs. She was then placed under general anesthesia with an LMA. Then she was prepped and draped in the standard sterile surgical fashion. Surgical timeout was then performed. I then located the seed in the upper outer left breastI infiltrated marcaine in the skin and then madean incisionin the in the low axilla to attempt to hide the scar. I then used the neoprobe to remove the seed and the surrounding tissue with attempt to get clear margins. I marked this with paint. MM confirmed removal of seed and theclip.I did remove additional posterior margin that went to the muscle that I thought might be close.I placed clips in the cavity.I then obtained hemostasis. This was marked as above. Through the same incision I did the sentinel node biopsy. .Icarried  this through the axillary fascia.there was radioactivity present that was easily noted. I removed the radioactive nodes.There were no enlarged nodes. The background radioactivity was minimal.I then obtained hemostasis. I closed the breast tissue with 2-0 vicryl I closed the axillary fascia with 2-0 vicryl.The skin was closed with 3-0 vicryl and 4-0 monocryl. Glue and steristrips were applied. A binder was placed.  She tolerated this well and was transferred to the recovery room in stable condition

## 2016-12-19 NOTE — Addendum Note (Signed)
Addendum  created 12/19/16 0918 by Janeece Riggers, MD   Child order released for a procedure order, Intraprocedure Blocks edited, Intraprocedure Meds edited, Sign clinical note

## 2016-12-19 NOTE — Discharge Instructions (Signed)
[facility] °Office Phone Number [phone] ° °POST OP INSTRUCTIONS ° °Always review your discharge instruction sheet given to you by the facility where your surgery was performed. ° °IF YOU HAVE DISABILITY OR FAMILY LEAVE FORMS, YOU MUST BRING THEM TO THE OFFICE FOR PROCESSING.  DO NOT GIVE THEM TO YOUR DOCTOR. ° °1. A prescription for pain medication may be given to you upon discharge.  Take your pain medication as prescribed, if needed.  If narcotic pain medicine is not needed, then you may take acetaminophen (Tylenol), naprosyn (Alleve) or ibuprofen (Advil) as needed. °2. Take your usually prescribed medications unless otherwise directed °3. If you need a refill on your pain medication, please contact your pharmacy.  They will contact our office to request authorization.  Prescriptions will not be filled after 5pm or on week-ends. °4. You should eat very light the first 24 hours after surgery, such as soup, crackers, pudding, etc.  Resume your normal diet the day after surgery. °5. Most patients will experience some swelling and bruising in the breast.  Ice packs and a good support bra will help.  Wear the breast binder provided or a sports bra for 72 hours day and night.  After that wear a sports bra during the day until you return to the office. Swelling and bruising can take several days to resolve.  °6. It is common to experience some constipation if taking pain medication after surgery.  Increasing fluid intake and taking a stool softener will usually help or prevent this problem from occurring.  A mild laxative (Milk of Magnesia or Miralax) should be taken according to package directions if there are no bowel movements after 48 hours. °7. Unless discharge instructions indicate otherwise, you may remove your bandages 48 hours after surgery and you may shower at that time.  You may have steri-strips (small skin tapes) in place directly over the incision.  These strips should be left on the  skin for 7-10 days and will come off on their own.  If your surgeon used skin glue on the incision, you may shower in 24 hours.  The glue will flake off over the next 2-3 weeks.  Any sutures or staples will be removed at the office during your follow-up visit. °8. ACTIVITIES:  You may resume regular daily activities (gradually increasing) beginning the next day.  Wearing a good support bra or sports bra minimizes pain and swelling.  You may have sexual intercourse when it is comfortable. °a. You may drive when you no longer are taking prescription pain medication, you can comfortably wear a seatbelt, and you can safely maneuver your car and apply brakes. °b. RETURN TO WORK:  ______________________________________________________________________________________ °9. You should see your doctor in the office for a follow-up appointment approximately two weeks after your surgery.  Your doctor’s nurse will typically make your follow-up appointment when she calls you with your pathology report.  Expect your pathology report 3-4 business days after your surgery.  You may call to check if you do not hear from us after three days. °10. OTHER INSTRUCTIONS: _______________________________________________________________________________________________ _____________________________________________________________________________________________________________________________________ °_____________________________________________________________________________________________________________________________________ °_____________________________________________________________________________________________________________________________________ ° °WHEN TO CALL DR WAKEFIELD: °1. Fever over 101.0 °2. Nausea and/or vomiting. °3. Extreme swelling or bruising. °4. Continued bleeding from incision. °5. Increased pain, redness, or drainage from the incision. ° °The clinic staff is available to answer your questions during regular  business hours.  Please don’t hesitate to call and ask to speak to one of the nurses for clinical concerns.  If   you have a medical emergency, go to the nearest emergency room or call 911.  A surgeon from [facility] is always on call at the hospital. ° °For further questions, please visit [email or web address] ° ° ° ° ° °Post Anesthesia Home Care Instructions ° °Activity: °Get plenty of rest for the remainder of the day. A responsible individual must stay with you for 24 hours following the procedure.  °For the next 24 hours, DO NOT: °-Drive a car °-Operate machinery °-Drink alcoholic beverages °-Take any medication unless instructed by your physician °-Make any legal decisions or sign important papers. ° °Meals: °Start with liquid foods such as gelatin or soup. Progress to regular foods as tolerated. Avoid greasy, spicy, heavy foods. If nausea and/or vomiting occur, drink only clear liquids until the nausea and/or vomiting subsides. Call your physician if vomiting continues. ° °Special Instructions/Symptoms: °Your throat may feel dry or sore from the anesthesia or the breathing tube placed in your throat during surgery. If this causes discomfort, gargle with warm salt water. The discomfort should disappear within 24 hours. ° °If you had a scopolamine patch placed behind your ear for the management of post- operative nausea and/or vomiting: ° °1. The medication in the patch is effective for 72 hours, after which it should be removed.  Wrap patch in a tissue and discard in the trash. Wash hands thoroughly with soap and water. °2. You may remove the patch earlier than 72 hours if you experience unpleasant side effects which may include dry mouth, dizziness or visual disturbances. °3. Avoid touching the patch. Wash your hands with soap and water after contact with the patch. °  ° °

## 2016-12-19 NOTE — Anesthesia Postprocedure Evaluation (Signed)
Anesthesia Post Note  Patient: Virginia Lindsey  Procedure(s) Performed: LEFT BREAST LUMPECTOMY WITH RADIOACTIVE SEED AND LEFT AXILLARY SENTINEL LYMPH NODE BIOPSY (Left Breast)     Patient location during evaluation: PACU Anesthesia Type: General Level of consciousness: awake and alert Pain management: pain level controlled Vital Signs Assessment: post-procedure vital signs reviewed and stable Respiratory status: spontaneous breathing, nonlabored ventilation, respiratory function stable and patient connected to nasal cannula oxygen Cardiovascular status: blood pressure returned to baseline and stable Postop Assessment: no apparent nausea or vomiting Anesthetic complications: no    Last Vitals:  Vitals:   12/19/16 0848 12/19/16 0900  BP: 120/67 132/75  Pulse: 88 91  Resp: 12 18  Temp: (!) 36.4 C 36.5 C  SpO2: 100% 100%    Last Pain:  Vitals:   12/19/16 0900  TempSrc:   PainSc: 0-No pain                 Zayne Marovich

## 2016-12-19 NOTE — Transfer of Care (Signed)
Immediate Anesthesia Transfer of Care Note  Patient: Virginia Lindsey  Procedure(s) Performed: LEFT BREAST LUMPECTOMY WITH RADIOACTIVE SEED AND LEFT AXILLARY SENTINEL LYMPH NODE BIOPSY (Left Breast)  Patient Location: PACU  Anesthesia Type:General  Level of Consciousness: sedated  Airway & Oxygen Therapy: Patient Spontanous Breathing and Patient connected to face mask oxygen  Post-op Assessment: Report given to RN and Post -op Vital signs reviewed and stable  Post vital signs: Reviewed and stable  Last Vitals:  Vitals:   12/19/16 0735 12/19/16 0740  BP:    Pulse:    Resp:    Temp:  36.7 C  SpO2: 98%     Last Pain:  Vitals:   12/19/16 0650  TempSrc: Oral         Complications: No apparent anesthesia complications

## 2016-12-19 NOTE — H&P (Signed)
66 yof referred by Dr Enriqueta Shutter for new left breast cancer. she has history of adh excised in 2014 and never went to high risk clinic. not sure why. she had screening mm with b density breasts. no mass or discharge. she had left breast asymmetry. no [place] correlate. she underwent stereo biopsy with clip placement. 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 she is here with her husband to discuss options  Past Surgical History (Tanisha A. Owens Shark, [place]; 12/07/2016 10:46 AM) Breast Biopsy  Left. Colon Polyp Removal - Colonoscopy  Gallbladder Surgery - Laparoscopic  Hysterectomy (not due to cancer) - Partial  Spinal Surgery - Lower Back   Diagnostic Studies History (Tanisha A. Owens Shark, [place]; 12/07/2016 10:46 AM) Colonoscopy  1-5 years ago Mammogram  within last year Pap Smear  >5 years ago  Allergies (Tanisha A. Owens Shark, [place]; 12/07/2016 10:48 AM) Hydrocodone-Acetaminophen *ANALGESICS - OPIOID*  Nausea, Vomiting. Allergies Reconciled   Medication History (Tanisha A. Owens Shark, [place]; 12/07/2016 10:48 AM) No Current Medications Medications Reconciled  Social History (Tanisha A. Owens Shark, [place]; 12/07/2016 10:46 AM) Caffeine use  Carbonated beverages, Tea. No alcohol use  No drug use  Tobacco use  Never smoker.  Family History (Tanisha A. Owens Shark, [place]; 12/07/2016 10:46 AM) Breast Cancer  Sister. Colon Cancer  Father. Diabetes Mellitus  Family Members In General. Kidney Disease  Mother. Melanoma  Family Members In General, Father. Respiratory Condition  Mother.  Pregnancy / Birth History (Tanisha A. Owens Shark, [place]; 12/07/2016 10:46 AM) Age at menarche  34 years. Gravida  4 Length (months) of breastfeeding  7-12 Maternal age  58-25 Para  41  Other Problems (Tanisha A. Brown, [place]; 12/07/2016 10:46 AM) Back Pain  Breast Cancer  Gastroesophageal Reflux Disease  Seizure Disorder  Sleep Apnea  Thyroid Disease    Review of Systems (Tanisha A. Brown RMA;  12/07/2016 10:46 AM) General Present- Fatigue. Not Present- Appetite Loss, Chills, Fever, Night Sweats, Weight Gain and Weight Loss. Skin Not Present- Change in Wart/Mole, Dryness, Hives, Jaundice, New Lesions, Non-Healing Wounds, Rash and Ulcer. HEENT Present- Ringing in the Ears and Wears glasses/contact lenses. Not Present- Earache, Hearing Loss, Hoarseness, Nose Bleed, Oral Ulcers, Seasonal Allergies, Sinus Pain, Sore Throat, Visual Disturbances and Yellow Eyes. Respiratory Present- Snoring. Not Present- Bloody sputum, Chronic Cough, Difficulty Breathing and Wheezing. Breast Not Present- Breast Mass, Breast Pain, Nipple Discharge and Skin Changes. Cardiovascular Present- Shortness of Breath. Not Present- Chest Pain, Difficulty Breathing Lying Down, Leg Cramps, Palpitations, Rapid Heart Rate and Swelling of Extremities. Female Genitourinary Present- Nocturia. Not Present- Frequency, Painful Urination, Pelvic Pain and Urgency. Musculoskeletal Present- Back Pain and Joint Stiffness. Not Present- Joint Pain, Muscle Pain, Muscle Weakness and Swelling of Extremities. Neurological Present- Seizures and Tingling. Not Present- Decreased Memory, Fainting, Headaches, Numbness, Tremor, Trouble walking and Weakness. Psychiatric Not Present- Anxiety, Bipolar, Change in Sleep Pattern, Depression, Fearful and Frequent crying. Endocrine Not Present- Cold Intolerance, Excessive Hunger, Hair Changes, Heat Intolerance, Hot flashes and New Diabetes. Hematology Not Present- Blood Thinners, Easy Bruising, Excessive bleeding, Gland problems, HIV and Persistent Infections.  Vitals (Tanisha A. Brown RMA; 12/07/2016 10:47 AM) 12/07/2016 10:47 AM Weight: 209.5 lb Height: 63.5in Body Surface Area: 1.98 m Body Mass Index: 36.53 kg/m  Temp.: 98.47F  Pulse: 92 (Regular)  BP: 132/86 (Sitting, Left Arm, Standard) Physical Exam Rolm Bookbinder MD; 12/07/2016 3:27 PM) General Mental  Status-Alert. Orientation-Oriented X3. Head and Neck Trachea-midline. Thyroid Gland Characteristics - normal size and consistency. Eye Sclera/Conjunctiva -  Bilateral-No scleral icterus. Chest and Lung Exam Chest and lung exam reveals -quiet, even and easy respiratory effort with no use of accessory muscles and on auscultation, normal breath sounds, no adventitious sounds and normal vocal resonance. Breast Nipples-No Discharge. Breast Lump-No Palpable Breast Mass. Note: healed luoq scar Cardiovascular Cardiovascular examination reveals -normal heart sounds, regular rate and rhythm with no murmurs. Abdomen Note: soft nt/ no hepatomegaly Lymphatic Head & Neck General Head & Neck Lymphatics: Bilateral - Description - Normal. Axillary General Axillary Region: Bilateral - Description - Normal. Note: no [place] adenopathy   Assessment & Plan Rolm Bookbinder MD; 12/07/2016 3:24 PM) BREAST CANCER OF UPPER-OUTER QUADRANT OF LEFT FEMALE BREAST (C50.412) Story: Left breast seed guided lumpectomy left axillary sentinel node biopsy We discussed the staging and pathophysiology of breast cancer. We discussed all of the different options for treatment for breast cancer including surgery, chemotherapy, radiation therapy, Herceptin, and antiestrogen therapy. We discussed a sentinel lymph node biopsy as she does not appear to having lymph node involvement right now. We discussed the performance of that with injection of radioactive tracer. We discussed that there is a chance of having a positive node with a sentinel lymph node biopsy and we will await the permanent pathology to make any other first further decisions in terms of her treatment. We discussed up to a 5% risk lifetime of chronic shoulder pain as well as lymphedema associated with a sentinel lymph node biopsy. We discussed the options for treatment of the breast cancer which included lumpectomy versus a mastectomy. We discussed  the performance of the lumpectomy with radioactive seed placement. We discussed a 5% chance of a positive margin requiring reexcision in the operating room. We also discussed that she will need radiation therapy if she undergoes lumpectomy. We discussed the mastectomy (removal of whole breast) and the postoperative care for that as well. Mastectomy can be followed by reconstruction. This is a more extensive surgery and requires more recovery. The decision for lumpectomy vs mastectomy has no impact on decision for chemotherapy. Most mastectomy patients will not need radiation therapy. We discussed that there is no difference in her survival whether she undergoes lumpectomy with radiation therapy or antiestrogen therapy versus a mastectomy. There is also no real difference between her recurrence in the breast. We discussed the risks of operation including bleeding, infection, possible reoperation. She understands her further therapy will be based on what her stages at the time of her operation.

## 2016-12-19 NOTE — Anesthesia Procedure Notes (Signed)
Procedure Name: LMA Insertion Date/Time: 12/19/2016 7:45 AM Performed by: Talbot Grumbling, CRNA Pre-anesthesia Checklist: Patient identified, Emergency Drugs available, Suction available and Patient being monitored Patient Re-evaluated:Patient Re-evaluated prior to induction Oxygen Delivery Method: Circle system utilized Preoxygenation: Pre-oxygenation with 100% oxygen Induction Type: IV induction Ventilation: Mask ventilation without difficulty LMA: LMA inserted LMA Size: 4.0 Number of attempts: 1 Placement Confirmation: positive ETCO2 and breath sounds checked- equal and bilateral Tube secured with: Tape Dental Injury: Teeth and Oropharynx as per pre-operative assessment

## 2016-12-19 NOTE — Interval H&P Note (Signed)
History and Physical Interval Note:  12/19/2016 7:19 AM  Virginia Lindsey  has presented today for surgery, with the diagnosis of LEFT BREAST CANCER  The various methods of treatment have been discussed with the patient and family. After consideration of risks, benefits and other options for treatment, the patient has consented to  Procedure(s): LEFT BREAST LUMPECTOMY WITH RADIOACTIVE SEED AND LEFT AXILLARY SENTINEL LYMPH NODE BIOPSY (Left) as a surgical intervention .  The patient's history has been reviewed, patient examined, no change in status, stable for surgery.  I have reviewed the patient's chart and labs.  Questions were answered to the patient's satisfaction.     Rolm Bookbinder

## 2016-12-19 NOTE — Progress Notes (Signed)
Assisted Dr. Oddono with left, ultrasound guided, pectoralis block. Side rails up, monitors on throughout procedure. See vital signs in flow sheet. Tolerated Procedure well. °

## 2016-12-20 ENCOUNTER — Encounter (HOSPITAL_BASED_OUTPATIENT_CLINIC_OR_DEPARTMENT_OTHER): Payer: Self-pay | Admitting: General Surgery

## 2016-12-20 NOTE — Addendum Note (Signed)
Addendum  created 12/20/16 1044 by Tawni Millers, CRNA   Charge Capture section accepted

## 2016-12-21 NOTE — Progress Notes (Signed)
Location of Breast Cancer: Left Breast  Histology per Pathology Report:  12/02/16 Diagnosis Breast, left, needle core biopsy, upper outer quadrant - INVASIVE DUCTAL CARCINOMA.  Receptor Status: ER(100%), PR (100%), Her2-neu (NEG), Ki-(2%)  12/19/16 Diagnosis 1. Breast, lumpectomy, Left - INVASIVE DUCTAL CARCINOMA GRADE I/III, SPANNING 0.6 CM. - ATYPICAL DUCTAL HYPERPLASIA WITH CALCIFICATIONS. - THE SURGICAL RESECTION MARGINS ARE NEGATIVE FOR CARCINOMA. - SEE ONCOLOGY TABLE BELOW. 2. Lymph node, sentinel, biopsy, Left axillary - THERE IS NO EVIDENCE OF CARCINOMA IN 1 OF 1 LYMPH NODE (0/1). 3. Lymph node, sentinel, biopsy, Left axillary - THERE IS NO EVIDENCE OF CARCINOMA IN 1 OF 1 LYMPH NODE (0/1). 4. Lymph node, sentinel, biopsy, Left axillary - THERE IS NO EVIDENCE OF CARCINOMA IN 1 OF 1 LYMPH NODE (0/1). 5. Breast, excision, Left additional posterior margin - BENIGN ADIPOSE TISSUE. - BENIGN SKELETAL MUSCLE. - THERE IS NO EVIDENCE OF MALIGNANCY.  Did patient present with symptoms or was this found on screening mammography?: It was found on a screening mammogram.   Past/Anticipated interventions by surgeon, if any: 12/19/16 Procedure:Leftbreast seed guided lumpectomy Leftdeep axillary sentinel node biopsy Surgeon: Dr Serita Grammes   Past/Anticipated interventions by medical oncology, if any: She has an appointment with Dr. Lindi Adie at 3:45 today.   Lymphedema issues, if any:  She reports slight swelling to her Left arm.   Pain issues, if any:  She denies. She has good left arm mobility.   SAFETY ISSUES:  Prior radiation? No  Pacemaker/ICD? No  Possible current pregnancy? No  Is the patient on methotrexate? No  Current Complaints / other details:    BP (!) 144/76   Pulse 74   Temp 98.3 F (36.8 C)   Ht 5' 4"  (1.626 m)   Wt 208 lb 12.8 oz (94.7 kg)   SpO2 100% Comment: room air  BMI 35.84 kg/m   Wt Readings from Last 3 Encounters:  12/28/16 208 lb  12.8 oz (94.7 kg)  12/19/16 213 lb (96.6 kg)  11/29/13 217 lb (98.4 kg)      [name], [name], RN 12/21/2016,10:39 AM

## 2016-12-27 DIAGNOSIS — Z170 Estrogen receptor positive status [ER+]: Principal | ICD-10-CM

## 2016-12-27 DIAGNOSIS — C50412 Malignant neoplasm of upper-outer quadrant of left female breast: Secondary | ICD-10-CM | POA: Insufficient documentation

## 2016-12-27 NOTE — Assessment & Plan Note (Signed)
12/19/16: Left Lumpectomy: Grade 1 IDC, 0.6 cm, Er 100%, PR 100%, Her 2 Neg, Ki 67: 3%, T1bN0 (stage 1A)  Pathology counseling: I discussed the final pathology report of the patient provided  a copy of this report. I discussed the margins as well as lymph node surgeries. We also discussed the final staging along with previously performed ER/PR and HER-2/neu testing.  Recommendation: 1. Adj XRT 2. Adj Anti-estrogen therapy with Letrozole 2.5 mg daily.  RTC after XRT is done to start anti-estrogen therapy.

## 2016-12-28 ENCOUNTER — Encounter: Payer: Self-pay | Admitting: Radiation Oncology

## 2016-12-28 ENCOUNTER — Ambulatory Visit
Admission: RE | Admit: 2016-12-28 | Discharge: 2016-12-28 | Disposition: A | Discharge status: Home / Self Care | Payer: Medicare Other | Source: Ambulatory Visit | Attending: Radiation Oncology | Admitting: Radiation Oncology

## 2016-12-28 ENCOUNTER — Ambulatory Visit (HOSPITAL_BASED_OUTPATIENT_CLINIC_OR_DEPARTMENT_OTHER): Payer: Medicare Other | Admitting: Hematology and Oncology

## 2016-12-28 VITALS — BP 144/76 | HR 74 | Temp 98.3°F | Ht 64.0 in | Wt 208.8 lb

## 2016-12-28 DIAGNOSIS — Z170 Estrogen receptor positive status [ER+]: Secondary | ICD-10-CM | POA: Insufficient documentation

## 2016-12-28 DIAGNOSIS — M7989 Other specified soft tissue disorders: Secondary | ICD-10-CM | POA: Insufficient documentation

## 2016-12-28 DIAGNOSIS — C50412 Malignant neoplasm of upper-outer quadrant of left female breast: Secondary | ICD-10-CM | POA: Insufficient documentation

## 2016-12-28 DIAGNOSIS — Z510 Encounter for antineoplastic radiation therapy: Secondary | ICD-10-CM | POA: Insufficient documentation

## 2016-12-28 DIAGNOSIS — Z809 Family history of malignant neoplasm, unspecified: Secondary | ICD-10-CM | POA: Insufficient documentation

## 2016-12-28 DIAGNOSIS — M549 Dorsalgia, unspecified: Secondary | ICD-10-CM | POA: Insufficient documentation

## 2016-12-28 DIAGNOSIS — G40909 Epilepsy, unspecified, not intractable, without status epilepticus: Secondary | ICD-10-CM | POA: Insufficient documentation

## 2016-12-28 DIAGNOSIS — Z79899 Other long term (current) drug therapy: Secondary | ICD-10-CM | POA: Diagnosis not present

## 2016-12-28 DIAGNOSIS — Z803 Family history of malignant neoplasm of breast: Secondary | ICD-10-CM | POA: Insufficient documentation

## 2016-12-28 NOTE — Progress Notes (Signed)
Radiation Oncology         [phone]) [phone] ________________________________  Initial outpatient Consultation  Name: Virginia Lindsey MRN: [id number]  Date: 12/28/2016  DOB: 01-12-1950  ZD:[id number], Shanon Brow, MD  Rolm Bookbinder, MD   REFERRING PHYSICIAN: Rolm Bookbinder, MD  DIAGNOSIS:    ICD-10-CM   1. 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 female who presented for screening mammogram on 11/22/2016 and subsequent imaging which showed left breast asymmetry.  Biopsy showed invasive ductal carcinoma of the left breast  with characteristics as described above in the diagnosis.  Lumpectomy and SLN bx on 12-19-16 revealed a 0.6cm tumor with negative sentinel nodes.  Margins negative by at least 31m.  She is accompanied by her husband today. She reports slightly swelling to her left arm. She denies any issues with pain and has good left arm mobility. She does note last night when she went to bed the skin around her breast was red and inflamed. She used ice packs to reduce this feeling and the skin appeared pink this morning. She is concerned about skin rash present after surgery. She used hydrocortisone on it once. States she is allergic to latex and let her surgeon know prior to surgery. She does report some tingling in both of her arms upon waking or after holding a book for an extended period of time. She denies diabetes. She denies any fever or chills. She denies taking any hormonal supplements currently. She reports tongue tenderness and associates it with recent mouth-breathing. She denies any headaches. She denies any lumps or bumps around her neck area. She denies any abdominal pain. She  has had some loose bowel movements recently. She occasionally has swelling of the ankle. She denies any vaginal discharge or bleeding. She reports prior thyroid issues.  She sees Dr GLindi Adietoday.  PREVIOUS RADIATION THERAPY: No  PAST MEDICAL HISTORY:  has a past medical history of Back pain, PONV (postoperative nausea and vomiting), Seizures ([place], and Wears glasses.    PAST SURGICAL HISTORY: Past Surgical History:  Procedure Laterality Date  . ABDOMINAL HYSTERECTOMY  1991   partial hysterectomy  . BREAST BIOPSY Left 10/29/2012   Procedure:  LEFT BREAST WIRE GUIDED EXCISION;  Surgeon: MRolm Bookbinder MD;  [place] [facility]  Service: General;  Laterality: Left;  . BREAST EXCISIONAL BIOPSY    . BREAST LUMPECTOMY WITH RADIOACTIVE SEED AND SENTINEL LYMPH NODE BIOPSY Left 12/19/2016   Procedure: LEFT BREAST LUMPECTOMY WITH RADIOACTIVE SEED AND LEFT AXILLARY SENTINEL LYMPH NODE BIOPSY;  Surgeon: WRolm Bookbinder MD;  [place] [facility]  Service: General;  Laterality: Left;  . BREAST SURGERY  2013   biopsy-lt  . CHOLECYSTECTOMY    . COLONOSCOPY    . LUMBAR LAMINECTOMY/DECOMPRESSION MICRODISCECTOMY Right 08/27/2012   Procedure: RIGHT  LUMBAR FOUR FIVE LAMINECTOMY/DECOMPRESSION MICRODISCECTOMY 1 LEVEL;  Surgeon: GElaina Hoops MD;  [facility];  Service: Neurosurgery;  Laterality: Right;  . UPPER GI ENDOSCOPY     multiple endoscopy's    FAMILY HISTORY: family history includes Asthma in her mother; Breast cancer in her sister; Cancer in her father and mother; Diabetes in her maternal grandmother.  SOCIAL HISTORY:  reports that  has never smoked. she has never used smokeless tobacco. She reports that she does not drink alcohol or use drugs.  ALLERGIES: Codeine; Hydrocodone; and Latex  MEDICATIONS:  Current Outpatient Medications  Medication Sig Dispense Refill  . acetaminophen (TYLENOL) 325 MG tablet Take 650 mg by mouth every 6 (six) hours  as needed.    . ondansetron (ZOFRAN) 4 MG tablet Take 1 tablet (4 mg total) by mouth daily as needed for nausea or vomiting. (Patient not taking: Reported on 12/28/2016) 10 tablet 0  . traMADol (ULTRAM) 50 MG tablet Take 1 tablet (50 mg total) by mouth every 6 (six) hours as needed for severe pain. (Patient not taking: Reported on 12/28/2016) 15 tablet 1   No current facility-administered medications for this encounter.     REVIEW OF SYSTEMS: A 10+ POINT REVIEW OF SYSTEMS WAS OBTAINED including neurology, dermatology, psychiatry, cardiac, respiratory, lymph, extremities, GI, GU, Musculoskeletal, constitutional, breasts, reproductive, HEENT.  All pertinent positives are noted in the HPI.  All others are negative.   PHYSICAL EXAM:  height is '5\' 4"'  (1.626 m) and weight is 208 lb 12.8 oz (94.7 kg). Her temperature is 98.3 F (36.8 C). Her blood pressure is 144/76 (abnormal) and her pulse is 74. Her oxygen saturation is 100%.   General: Alert and oriented, in no acute distress HEENT: Head is normocephalic. Extraocular movements are intact. Oropharynx is clear. Neck: Neck is supple, no palpable cervical or supraclavicular lymphadenopathy. Heart: Regular in rate and rhythm with no murmurs, rubs, or gallops. Chest: Clear to auscultation bilaterally, with no rhonchi, wheezes, or rales. Abdomen: Soft, nontender, nondistended, with no rigidity or guarding. Extremities: No cyanosis or edema. No significant edema of the extremities. Lymphatics: see Neck Exam Skin: Erythematous rash on upper chest through her neck, and a demarcated linear rash between her breasts as well as circular rashes from adhesive discs that were placed at surgery. Musculoskeletal: symmetric strength and muscle tone throughout. Neurologic: Cranial nerves II through XII are grossly intact. No obvious focalities. Speech is fluent. Coordination is intact. Psychiatric: Judgment and insight are intact. Affect is appropriate. Breasts:  Healing scar in the left axilla with steri strips in place. Mild pinkness in the upper outer quadrant of the left breast. The left breast was not well palpated since it is still healing from surgery. Right breast and axilla with no palpable masses appreciated. No other palpable masses appreciated in the breasts or axillae.   ECOG = 0  0 - Asymptomatic (Fully active, able to carry on all predisease activities without restriction)  1 - Symptomatic but completely ambulatory (Restricted in physically strenuous activity but ambulatory and able to carry out work of a light or sedentary nature. For example, light housework, office work)  2 - Symptomatic, <50% in bed during the day (Ambulatory and capable of all self care but unable to carry out any work activities. Up and about more than 50% of waking hours)  3 - Symptomatic, >50% in bed, but not bedbound (Capable of only limited self-care, confined to bed or chair 50% or more of waking hours)  4 - Bedbound (Completely disabled. Cannot carry on any self-care. Totally confined to bed or chair)  5 - Death   Eustace Pen MM, Creech RH, Tormey DC, et al. [phone]). "Toxicity and response criteria of the [facility]". Ona Oncol. 5 (6): 649-55   LABORATORY DATA:  Lab Results  Component Value Date   WBC 8.8 08/23/2012   HGB  13.7 08/23/2012   HCT 39.1 08/23/2012   MCV 90.3 08/23/2012   PLT 320 08/23/2012   CMP     Component Value Date/Time   NA 142 08/23/2012 1600   K 3.5 08/23/2012 1600   CL 105 08/23/2012 1600   CO2 27 08/23/2012 1600   GLUCOSE 91 08/23/2012 1600   BUN 12 08/23/2012 1600   CREATININE 0.80 08/23/2012 1600   CALCIUM 9.4 08/23/2012 1600   GFRNONAA 76 (L) 08/23/2012 1600   GFRAA 88 (L) 08/23/2012 1600         RADIOGRAPHY: Nm Sentinel Node Inj-no Rpt (breast)  Result Date: 12/19/2016 Sulfur colloid was injected by the nuclear medicine technologist for melanoma sentinel node.   Mm Breast Surgical  Specimen  Result Date: 12/19/2016 CLINICAL DATA:  Radioactive seed localization was performed of the left breast prior to lumpectomy. Lumpectomy is performed today. EXAM: SPECIMEN RADIOGRAPH OF THE LEFT BREAST COMPARISON:  Previous exam(s). FINDINGS: Status post excision of the left breast. The radioactive seed and biopsy marker clip are present, completely intact, and were marked for pathology. IMPRESSION: Specimen radiograph of the left breast. Electronically Signed   By: Curlene Dolphin M.D.   On: 12/19/2016 08:10   US Breast Ltd Uni Left Inc Axilla  Result Date: 11/30/2016 CLINICAL DATA:  The patient was called back for a left breast asymmetry. EXAM: 2D DIGITAL DIAGNOSTIC LEFT MAMMOGRAM WITH CAD AND ADJUNCT TOMO ULTRASOUND LEFT BREAST COMPARISON:  Previous exam(s). ACR Breast Density Category b: There are scattered areas of fibroglandular density. FINDINGS: The left breast asymmetry persists on additional imaging. No other suspicious findings. Mammographic images were processed with CAD. On physical exam, no suspicious lumps identified. Targeted ultrasound is performed, showing no sonographic correlate to the left breast asymmetry. IMPRESSION: Persistent new left breast asymmetry. RECOMMENDATION: Recommend stereotactic biopsy of the left breast asymmetry. I have discussed the findings and recommendations with the patient. Results were also provided in writing at the conclusion of the visit. If applicable, a reminder letter will be sent to the patient regarding the next appointment. BI-RADS CATEGORY  4: Suspicious. Electronically Signed   By: Dorise Bullion III M.D   On: 11/30/2016 10:57   Mm Diag Breast Tomo Uni Left  Result Date: 11/30/2016 CLINICAL DATA:  The patient was called back for a left breast asymmetry. EXAM: 2D DIGITAL DIAGNOSTIC LEFT MAMMOGRAM WITH CAD AND ADJUNCT TOMO ULTRASOUND LEFT BREAST COMPARISON:  Previous exam(s). ACR Breast Density Category b: There are scattered areas of  fibroglandular density. FINDINGS: The left breast asymmetry persists on additional imaging. No other suspicious findings. Mammographic images were processed with CAD. On physical exam, no suspicious lumps identified. Targeted ultrasound is performed, showing no sonographic correlate to the left breast asymmetry. IMPRESSION: Persistent new left breast asymmetry. RECOMMENDATION: Recommend stereotactic biopsy of the left breast asymmetry. I have discussed the findings and recommendations with the patient. Results were also provided in writing at the conclusion of the visit. If applicable, a reminder letter will be sent to the patient regarding the next appointment. BI-RADS CATEGORY  4: Suspicious. Electronically Signed   By: Dorise Bullion III M.D   On: 11/30/2016 10:57   [place] Axilla Left  Result Date: 12/07/2016 CLINICAL DATA:  Recently diagnosed left breast invasive ductal carcinoma. EXAM: ULTRASOUND OF THE LEFT AXILLA COMPARISON:  Previous mammogram and ultrasound examinations. FINDINGS: On physical exam,no mass is palpable in the left axilla. Ultrasound is performed, showing normal appearing left axillary lymph nodes. IMPRESSION: Normal appearing left axillary lymph  nodes without ultrasound evidence of metastatic disease. RECOMMENDATION: Treatment plan for the patient's known left breast cancer. I have discussed the findings and recommendations with the patient. Results were also provided in writing at the conclusion of the visit. If applicable, a reminder letter will be sent to the patient regarding the next appointment. BI-RADS CATEGORY  2: Benign. Electronically Signed   By: Claudie Revering M.D.   On: 12/07/2016 09:40   Mm Lt Radioactive Seed Loc Mammo Guide  Result Date: 12/16/2016 CLINICAL DATA:  Localization of breast cancer EXAM: MAMMOGRAPHIC GUIDED RADIOACTIVE SEED LOCALIZATION OF THE LEFT BREAST COMPARISON:  Previous exam(s). FINDINGS: Patient presents for radioactive seed localization prior to  surgery. I met with the patient and we discussed the procedure of seed localization including benefits and alternatives. We discussed the high likelihood of a successful procedure. We discussed the risks of the procedure including infection, bleeding, tissue injury and further surgery. We discussed the low dose of radioactivity involved in the procedure. Informed, written consent was given. The usual time-out protocol was performed immediately prior to the procedure. Using mammographic guidance, sterile technique, 1% lidocaine and an I-125 radioactive seed, the patient's known breast cancer as marked by the biopsy clip was localized using a superior approach. The follow-up mammogram images confirm the seed in the expected location and were marked for the surgeon. Follow-up survey of the patient confirms presence of the radioactive seed. Order number of I-125 seed:  [id number]. Total activity:  1.761 millicuries  Reference Date: December 09, 2016 The patient tolerated the procedure well and was released from the [facility]. She was given instructions regarding seed removal. IMPRESSION: Radioactive seed localization left breast. No apparent complications. Electronically Signed   By: Dorise Bullion III M.D   On: 12/16/2016 13:41   Mm Clip Placement Left  Result Date: 12/02/2016 CLINICAL DATA:  Status post stereotactic biopsy of a developing asymmetry in the upper left breast. EXAM: DIAGNOSTIC LEFT MAMMOGRAM POST STEREOTACTIC BIOPSY COMPARISON:  Previous exam(s). FINDINGS: Mammographic images were obtained following stereotactic guided biopsy of a developing asymmetry within the upper-outer quadrant of the left breast. At the conclusion of the procedure, a coil shaped tissue marker was placed at the biopsy site. Biopsy clip is well-positioned at the targeted asymmetry. IMPRESSION: Coil shaped biopsy clip is well-positioned at the targeted asymmetry in the upper-outer quadrant of the left breast. Final Assessment:  Post Procedure Mammograms for Marker Placement Electronically Signed   By: Franki Cabot M.D.   On: 12/02/2016 10:25   Mm Lt Breast Bx W Loc Dev 1st Lesion Image Bx Spec Stereo Guide  Addendum Date: 12/06/2016   ADDENDUM REPORT: 12/05/2016 14:48 ADDENDUM: Pathology revealed GRADE I INVASIVE DUCTAL CARCINOMA of the Left breast, upper outer quadrant. This was found to be concordant by Dr. Franki Cabot. Pathology results were discussed with the patient by telephone. The patient reported doing well after the biopsy with tenderness at the site. Post biopsy instructions and care were reviewed and questions were answered. The patient was encouraged to call [facility] for any additional concerns. Surgical consultation has been arranged with Dr. Rolm Bookbinder, per patient request, at [facility] on December 07, 2016. The patient is scheduled for a Left axillary ultrasound on December 07, 2016 at [facility]. Pathology results reported by Terie Purser, RN on 12/05/2016. Electronically Signed   By: Franki Cabot M.D.   On: 12/05/2016 14:48   Result Date: 12/06/2016 CLINICAL DATA:  Patient with  developing asymmetry in the upper left breast presents today for stereotactic biopsy using 3D tomosynthesis guidance. History of left breast ADH, different location in the left breast, status post surgical excision. EXAM: LEFT BREAST STEREOTACTIC CORE NEEDLE BIOPSY COMPARISON:  Previous exams. FINDINGS: The patient and I discussed the procedure of stereotactic-guided biopsy including benefits and alternatives. We discussed the high likelihood of a successful procedure. We discussed the risks of the procedure including infection, bleeding, tissue injury, clip migration, and inadequate sampling. Informed written consent was given. The usual time out protocol was performed immediately prior to the procedure. Using sterile technique and 1% Lidocaine as local anesthetic, under  stereotactic guidance, a 9 gauge vacuum assisted device was used to perform core needle biopsy of developing asymmetry in the upper outer quadrant of the left breast using a lateral approach. Lesion quadrant: Upper outer quadrant At the conclusion of the procedure, a coil shaped tissue marker clip was deployed into the biopsy cavity. Follow-up 2-view mammogram was performed and dictated separately. IMPRESSION: Stereotactic-guided biopsy of developing asymmetry within the upper-outer quadrant of the left breast. No apparent complications. Electronically Signed: By: Franki Cabot M.D. On: 12/02/2016 10:24      IMPRESSION/PLAN: Stage pT1b Left Breast UOQ Invasive Ductal Carcinoma, ER+ / PR+ / Her2(-), Grade I/III   It was a pleasure meeting the patient today. We discussed the risks, benefits, and side effects of radiotherapy. I recommend radiotherapy to the left breast to reduce her risk of locoregional recurrence by 2/3.  We discussed that radiation would take approximately 3-4 weeks to complete and that I would give the patient a few weeks to heal following surgery before starting treatment planning. We spoke about acute effects including skin irritation and fatigue as well as much less common late effects including internal organ injury or irritation. We spoke about the latest technology that is used to minimize the risk of late effects for patients undergoing radiotherapy to the breast or chest wall. No guarantees of treatment were given. Consent form was signed and placed in patient chart. The patient is enthusiastic about proceeding with treatment. The patient will be scheduled for CT simulation and planning appointment in the near future once she has fully healed. I look forward to participating in the patient's care.    __________________________________________   Eppie Gibson, MD   This document serves as a record of services personally performed by Eppie Gibson, MD. It was created on her behalf  by Arlyce Harman, a trained medical scribe. The creation of this record is based on the scribe's personal observations and the provider's statements to them. This document has been checked and approved by the attending provider.

## 2016-12-28 NOTE — Progress Notes (Signed)
[facility] CONSULT NOTE  Patient Care Team: Antony Contras, MD as PCP - General (Family Medicine)  CHIEF COMPLAINTS/PURPOSE OF CONSULTATION:  Newly diagnosed breast cancer  HISTORY OF PRESENTING ILLNESS:  Virginia Lindsey 66 y.o. female is here because of recent diagnosis of left breast cancer.  Patient had a screening mammogram that detected a slight abnormality in the left breast.  This was further evaluated by ultrasound which did not show any clear-cut abnormality.  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  She subsequently underwent a lumpectomy on 12/19/2016 which revealed a 0.6 cm sized grade 1 invasive ductal carcinoma.  She was sent to [place] for discussion regarding adjuvant treatment options.  I reviewed her records extensively and collaborated the history with the patient.  SUMMARY OF ONCOLOGIC HISTORY:   Malignant neoplasm of upper-outer quadrant of left breast in female, estrogen receptor positive ([place])   12/02/2016 Initial Diagnosis    IDC grade 1 ER/PR pos, Her 2 Neg      12/19/2016 Surgery    Left Lumpectomy: Grade 1 IDC, 0.6 cm, Er 100%, PR 100%, Her 2 Neg, Ki 67: 3%, T1bN0 (stage 1A)      MEDICAL HISTORY:  Past Medical History:  Diagnosis Date  . Back pain   . PONV (postoperative nausea and vomiting)   . Seizures ([place])    last seizure 2004  3 total 10 yrs apart  . Wears glasses     SURGICAL HISTORY: Past Surgical History:  Procedure Laterality Date  . ABDOMINAL HYSTERECTOMY  1991   partial hysterectomy  . BREAST BIOPSY Left 10/29/2012   Procedure:  LEFT BREAST WIRE GUIDED EXCISION;  Surgeon: Rolm Bookbinder, MD;  Location: [facility];  Service: General;  Laterality: Left;  . BREAST EXCISIONAL BIOPSY    . BREAST LUMPECTOMY WITH RADIOACTIVE SEED AND SENTINEL LYMPH NODE BIOPSY Left 12/19/2016   Procedure: LEFT BREAST LUMPECTOMY WITH  RADIOACTIVE SEED AND LEFT AXILLARY SENTINEL LYMPH NODE BIOPSY;  Surgeon: Rolm Bookbinder, MD;  Location: [facility];  Service: General;  Laterality: Left;  . BREAST SURGERY  2013   biopsy-lt  . CHOLECYSTECTOMY    . COLONOSCOPY    . LUMBAR LAMINECTOMY/DECOMPRESSION MICRODISCECTOMY Right 08/27/2012   Procedure: RIGHT  LUMBAR FOUR FIVE LAMINECTOMY/DECOMPRESSION MICRODISCECTOMY 1 LEVEL;  Surgeon: Elaina Hoops, MD;  Location: [facility];  Service: Neurosurgery;  Laterality: Right;  . UPPER GI ENDOSCOPY     multiple endoscopy's    SOCIAL HISTORY: Social History   Socioeconomic History  . Marital status: Married    Spouse name: Not on file  . Number of children: Not on file  . Years of education: Not on file  . Highest education level: Not on file  Social Needs  . Financial resource strain: Not on file  . Food insecurity - worry: Not on file  . Food insecurity - inability: Not on file  . Transportation needs - medical: Not on file  . Transportation needs - non-medical: Not on file  Occupational History  . Not on file  Tobacco Use  . Smoking status: Never Smoker  . Smokeless tobacco: Never Used  Substance and Sexual Activity  . Alcohol use: No  . Drug use: No  . Sexual activity: Not on file  Other Topics Concern  . Not on file  Social History Narrative  . Not on file  FAMILY HISTORY: Family History  Problem Relation Age of Onset  . Asthma Mother   . Cancer Mother        renal cell carcinoma  . Cancer Father        melanoma on face  . Diabetes Maternal Grandmother   . Breast cancer Sister     ALLERGIES:  is allergic to codeine; hydrocodone; and latex.  MEDICATIONS:  Current Outpatient Medications  Medication Sig Dispense Refill  . acetaminophen (TYLENOL) 325 MG tablet Take 650 mg by mouth every 6 (six) hours as needed.    . ondansetron (ZOFRAN) 4 MG tablet Take 1 tablet (4 mg total) by mouth daily as needed for nausea or vomiting. (Patient not  taking: Reported on 12/28/2016) 10 tablet 0  . traMADol (ULTRAM) 50 MG tablet Take 1 tablet (50 mg total) by mouth every 6 (six) hours as needed for severe pain. (Patient not taking: Reported on 12/28/2016) 15 tablet 1   No current facility-administered medications for this visit.     REVIEW OF SYSTEMS:   Constitutional: Denies fevers, chills or abnormal night sweats Eyes: Denies blurriness of vision, double vision or watery eyes Ears, nose, mouth, throat, and face: Denies mucositis or sore throat Respiratory: Denies cough, dyspnea or wheezes Cardiovascular: Denies palpitation, chest discomfort or lower extremity swelling Gastrointestinal:  Denies nausea, heartburn or change in bowel habits Skin: Skin rash related to latex allergy Lymphatics: Denies new lymphadenopathy or easy bruising Neurological:Denies numbness, tingling or new weaknesses Behavioral/Psych: Mood is stable, no new changes  Breast: Recent lumpectomy All other systems were reviewed with the patient and are negative.  PHYSICAL EXAMINATION: ECOG PERFORMANCE STATUS: 1 - Symptomatic but completely ambulatory  Vitals:   12/28/16 1257  BP: (!) 162/89  Pulse: 72  Resp: 18  Temp: 97.9 F (36.6 C)  SpO2: 100%   Filed Weights   12/28/16 1257  Weight: 208 lb (94.3 kg)    GENERAL:alert, no distress and comfortable SKIN: Skin rash related to latex allergy EYES: normal, conjunctiva are pink and non-injected, sclera clear OROPHARYNX:no exudate, no erythema and lips, buccal mucosa, and tongue normal  NECK: supple, thyroid normal size, non-tender, without nodularity LYMPH:  no palpable lymphadenopathy in the cervical, axillary or inguinal LUNGS: clear to auscultation and percussion with normal breathing effort HEART: regular rate & rhythm and no murmurs and no lower extremity edema ABDOMEN:abdomen soft, non-tender and normal bowel sounds Musculoskeletal:no cyanosis of digits and no clubbing  PSYCH: alert & oriented x 3  with fluent speech NEURO: no focal motor/sensory deficits BREAST: Rash on the chest related to probably latex allergy. No palpable axillary or supraclavicular lymphadenopathy (exam performed in the presence of a chaperone)   LABORATORY DATA:  I have reviewed the data as listed Lab Results  Component Value Date   WBC 8.8 08/23/2012   HGB 13.7 08/23/2012   HCT 39.1 08/23/2012   MCV 90.3 08/23/2012   PLT 320 08/23/2012   Lab Results  Component Value Date   NA 142 08/23/2012   K 3.5 08/23/2012   CL 105 08/23/2012   CO2 27 08/23/2012    RADIOGRAPHIC STUDIES: I have personally reviewed the radiological reports and agreed with the findings in the report.  ASSESSMENT AND PLAN:  Malignant neoplasm of upper-outer quadrant of left breast in female, estrogen receptor positive ([place]) 12/19/16: Left Lumpectomy: Grade 1 IDC, 0.6 cm, Er 100%, PR 100%, Her 2 Neg, Ki 67: 3%, T1bN0 (stage 1A)  Pathology counseling: I discussed the final pathology report  of the patient provided  a copy of this report. I discussed the margins as well as lymph node surgeries. We also discussed the final staging along with previously performed ER/PR and HER-2/neu testing.  Recommendation: 1. Adj XRT 2. Adj Anti-estrogen therapy with Letrozole 2.5 mg daily.  RTC after XRT is done to start anti-estrogen therapy.   All questions were answered. The patient knows to call the clinic with any problems, questions or concerns.    Harriette Ohara, MD 12/28/16

## 2016-12-30 ENCOUNTER — Telehealth: Payer: Self-pay | Admitting: *Deleted

## 2016-12-30 NOTE — Telephone Encounter (Signed)
FYI "I need to Genetic department or Dr. Lanell Persons further about what BRCA testing is about.  She mentioned yesterday.  I'm not calling to schedule an appointment with genetics, I just need more information."  Call transferred to Radiation oncology.

## 2017-01-03 DIAGNOSIS — Z170 Estrogen receptor positive status [ER+]: Secondary | ICD-10-CM | POA: Diagnosis not present

## 2017-01-03 DIAGNOSIS — Z79899 Other long term (current) drug therapy: Secondary | ICD-10-CM | POA: Diagnosis not present

## 2017-01-03 DIAGNOSIS — Z803 Family history of malignant neoplasm of breast: Secondary | ICD-10-CM | POA: Diagnosis not present

## 2017-01-03 DIAGNOSIS — G40909 Epilepsy, unspecified, not intractable, without status epilepticus: Secondary | ICD-10-CM | POA: Diagnosis not present

## 2017-01-03 DIAGNOSIS — Z510 Encounter for antineoplastic radiation therapy: Secondary | ICD-10-CM | POA: Diagnosis not present

## 2017-01-03 DIAGNOSIS — Z809 Family history of malignant neoplasm, unspecified: Secondary | ICD-10-CM | POA: Diagnosis not present

## 2017-01-03 DIAGNOSIS — M549 Dorsalgia, unspecified: Secondary | ICD-10-CM | POA: Diagnosis not present

## 2017-01-03 DIAGNOSIS — M7989 Other specified soft tissue disorders: Secondary | ICD-10-CM | POA: Diagnosis not present

## 2017-01-03 DIAGNOSIS — C50412 Malignant neoplasm of upper-outer quadrant of left female breast: Secondary | ICD-10-CM | POA: Diagnosis present

## 2017-01-04 NOTE — Addendum Note (Signed)
Addendum  created 01/04/17 0902 by Janeece Riggers, MD   Intraprocedure Blocks edited, Sign clinical note

## 2017-01-16 ENCOUNTER — Ambulatory Visit
Admission: RE | Admit: 2017-01-16 | Discharge: 2017-01-16 | Disposition: A | Discharge status: Home / Self Care | Payer: Medicare Other | Source: Ambulatory Visit | Attending: Radiation Oncology | Admitting: Radiation Oncology

## 2017-01-16 DIAGNOSIS — C50412 Malignant neoplasm of upper-outer quadrant of left female breast: Secondary | ICD-10-CM | POA: Diagnosis not present

## 2017-01-16 DIAGNOSIS — Z170 Estrogen receptor positive status [ER+]: Principal | ICD-10-CM

## 2017-01-16 NOTE — Progress Notes (Signed)
  Radiation Oncology         [phone] ________________________________  Name: Virginia Lindsey MRN: [id number]  Date: 01/16/2017  DOB: [birth year]  SIMULATION AND TREATMENT PLANNING NOTE    Outpatient  DIAGNOSIS:     ICD-10-CM   1. Malignant neoplasm of upper-outer quadrant of left breast in female, estrogen receptor positive ([place]) C50.412    Z17.0     NARRATIVE:  The patient was brought to the [facility].  Identity was confirmed.  All relevant records and images related to the planned course of therapy were reviewed.  The patient freely provided informed written consent to proceed with treatment after reviewing the details related to the planned course of therapy. The consent form was witnessed and verified by the simulation staff.    Then, the patient was set-up in a stable reproducible supine position for radiation therapy with her ipsilateral arm over her head, and her upper body secured in a custom-made Vac-lok device.  CT images were obtained.  Surface markings were placed.  The CT images were loaded into the planning software.    TREATMENT PLANNING NOTE: Treatment planning then occurred.  The radiation prescription was entered and confirmed.     A total of 3 medically necessary complex treatment devices were fabricated and supervised by me: 2 fields with MLCs for custom blocks to protect heart, and lungs;  and, a Vac-lok. MORE COMPLEX DEVICES MAY BE MADE IN DOSIMETRY FOR FIELD IN FIELD BEAMS FOR DOSE HOMOGENEITY.  I have requested : 3D Simulation which is medically necessary to give adequate dose to at risk tissues while sparing lungs and heart.  I have requested a DVH of the following structures: lungs, heart, Left Lumpectomy cavity.    The patient will receive 40.05 Gy in 15 fractions to the left breast with 2 tangential fields.   This will be followed by a boost.  Optical Surface Tracking Plan:  Since intensity modulated radiotherapy (IMRT) and 3D conformal  radiation treatment methods are predicated on accurate and precise positioning for treatment, intrafraction motion monitoring is medically necessary to ensure accurate and safe treatment delivery. The ability to quantify intrafraction motion without excessive ionizing radiation dose can only be performed with optical surface tracking. Accordingly, surface imaging offers the opportunity to obtain 3D measurements of patient position throughout IMRT and 3D treatments without excessive radiation exposure. I am ordering optical surface tracking for this patient's upcoming course of radiotherapy.  ________________________________   Reference:  Ursula Alert, J, et al. Surface imaging-based analysis of intrafraction motion for breast radiotherapy patients.Journal of [place], n. 6, nov. 2014. ISSN [id number].  Available at: <http://www.jacmp.org/index.php/jacmp/article/view/4957>.    -----------------------------------  Eppie Gibson, MD

## 2017-01-17 ENCOUNTER — Other Ambulatory Visit: Payer: Self-pay

## 2017-01-17 ENCOUNTER — Telehealth: Payer: Self-pay | Admitting: Hematology and Oncology

## 2017-01-17 DIAGNOSIS — C50412 Malignant neoplasm of upper-outer quadrant of left female breast: Secondary | ICD-10-CM

## 2017-01-17 DIAGNOSIS — Z170 Estrogen receptor positive status [ER+]: Principal | ICD-10-CM

## 2017-01-17 NOTE — Telephone Encounter (Signed)
Left message for patient regarding upcoming February appointments per 1/15 sch message

## 2017-01-18 DIAGNOSIS — C50412 Malignant neoplasm of upper-outer quadrant of left female breast: Secondary | ICD-10-CM | POA: Diagnosis not present

## 2017-01-23 ENCOUNTER — Ambulatory Visit
Admission: RE | Admit: 2017-01-23 | Discharge: 2017-01-23 | Disposition: A | Discharge status: Home / Self Care | Payer: Medicare Other | Source: Ambulatory Visit | Attending: Radiation Oncology | Admitting: Radiation Oncology

## 2017-01-23 DIAGNOSIS — C50412 Malignant neoplasm of upper-outer quadrant of left female breast: Secondary | ICD-10-CM | POA: Diagnosis not present

## 2017-01-24 ENCOUNTER — Ambulatory Visit
Admission: RE | Admit: 2017-01-24 | Discharge: 2017-01-24 | Disposition: A | Discharge status: Home / Self Care | Payer: Medicare Other | Source: Ambulatory Visit | Attending: Radiation Oncology | Admitting: Radiation Oncology

## 2017-01-24 DIAGNOSIS — C50412 Malignant neoplasm of upper-outer quadrant of left female breast: Secondary | ICD-10-CM | POA: Diagnosis not present

## 2017-01-25 ENCOUNTER — Ambulatory Visit
Admission: RE | Admit: 2017-01-25 | Discharge: 2017-01-25 | Disposition: A | Discharge status: Home / Self Care | Payer: Medicare Other | Source: Ambulatory Visit | Attending: Radiation Oncology | Admitting: Radiation Oncology

## 2017-01-25 DIAGNOSIS — C50412 Malignant neoplasm of upper-outer quadrant of left female breast: Secondary | ICD-10-CM | POA: Diagnosis not present

## 2017-01-26 ENCOUNTER — Ambulatory Visit
Admission: RE | Admit: 2017-01-26 | Discharge: 2017-01-26 | Disposition: A | Discharge status: Home / Self Care | Payer: Medicare Other | Source: Ambulatory Visit | Attending: Radiation Oncology | Admitting: Radiation Oncology

## 2017-01-26 DIAGNOSIS — C50412 Malignant neoplasm of upper-outer quadrant of left female breast: Secondary | ICD-10-CM | POA: Diagnosis not present

## 2017-01-27 ENCOUNTER — Ambulatory Visit
Admission: RE | Admit: 2017-01-27 | Discharge: 2017-01-27 | Disposition: A | Discharge status: Home / Self Care | Payer: Medicare Other | Source: Ambulatory Visit | Attending: Radiation Oncology | Admitting: Radiation Oncology

## 2017-01-27 DIAGNOSIS — C50412 Malignant neoplasm of upper-outer quadrant of left female breast: Secondary | ICD-10-CM | POA: Diagnosis not present

## 2017-01-30 ENCOUNTER — Ambulatory Visit
Admission: RE | Admit: 2017-01-30 | Discharge: 2017-01-30 | Disposition: A | Discharge status: Home / Self Care | Payer: Medicare Other | Source: Ambulatory Visit | Attending: Radiation Oncology | Admitting: Radiation Oncology

## 2017-01-30 DIAGNOSIS — C50412 Malignant neoplasm of upper-outer quadrant of left female breast: Secondary | ICD-10-CM | POA: Diagnosis not present

## 2017-01-30 DIAGNOSIS — Z170 Estrogen receptor positive status [ER+]: Principal | ICD-10-CM

## 2017-01-30 MED ORDER — ALRA NON-METALLIC DEODORANT (RAD-ONC): 1.0000 "application " | Freq: Once | TOPICAL | Status: DC

## 2017-01-30 MED ORDER — RADIAPLEXRX EX GEL
Freq: Two times a day (BID) | CUTANEOUS | Status: DC
  Administered 2017-01-30: 13:00:00 via TOPICAL

## 2017-01-30 MED ORDER — RADIAPLEXRX EX GEL: Freq: Once | CUTANEOUS | Status: DC

## 2017-01-30 NOTE — Progress Notes (Signed)

## 2017-01-31 ENCOUNTER — Ambulatory Visit
Admission: RE | Admit: 2017-01-31 | Discharge: 2017-01-31 | Disposition: A | Discharge status: Home / Self Care | Payer: Medicare Other | Source: Ambulatory Visit | Attending: Radiation Oncology | Admitting: Radiation Oncology

## 2017-01-31 DIAGNOSIS — C50412 Malignant neoplasm of upper-outer quadrant of left female breast: Secondary | ICD-10-CM | POA: Diagnosis not present

## 2017-02-01 ENCOUNTER — Ambulatory Visit
Admission: RE | Admit: 2017-02-01 | Discharge: 2017-02-01 | Disposition: A | Discharge status: Home / Self Care | Payer: Medicare Other | Source: Ambulatory Visit | Attending: Radiation Oncology | Admitting: Radiation Oncology

## 2017-02-01 DIAGNOSIS — C50412 Malignant neoplasm of upper-outer quadrant of left female breast: Secondary | ICD-10-CM | POA: Diagnosis not present

## 2017-02-02 ENCOUNTER — Ambulatory Visit
Admission: RE | Admit: 2017-02-02 | Discharge: 2017-02-02 | Disposition: A | Discharge status: Home / Self Care | Payer: Medicare Other | Source: Ambulatory Visit | Attending: Radiation Oncology | Admitting: Radiation Oncology

## 2017-02-02 DIAGNOSIS — C50412 Malignant neoplasm of upper-outer quadrant of left female breast: Secondary | ICD-10-CM | POA: Diagnosis not present

## 2017-02-03 ENCOUNTER — Ambulatory Visit
Admission: RE | Admit: 2017-02-03 | Discharge: 2017-02-03 | Disposition: A | Discharge status: Home / Self Care | Payer: Medicare Other | Source: Ambulatory Visit | Attending: Radiation Oncology | Admitting: Radiation Oncology

## 2017-02-03 DIAGNOSIS — C50412 Malignant neoplasm of upper-outer quadrant of left female breast: Secondary | ICD-10-CM | POA: Diagnosis not present

## 2017-02-06 ENCOUNTER — Ambulatory Visit: Payer: Medicare Other | Admitting: Radiation Oncology

## 2017-02-06 ENCOUNTER — Ambulatory Visit
Admission: RE | Admit: 2017-02-06 | Discharge: 2017-02-06 | Disposition: A | Discharge status: Home / Self Care | Payer: Medicare Other | Source: Ambulatory Visit | Attending: Radiation Oncology | Admitting: Radiation Oncology

## 2017-02-06 DIAGNOSIS — C50412 Malignant neoplasm of upper-outer quadrant of left female breast: Secondary | ICD-10-CM | POA: Diagnosis not present

## 2017-02-07 ENCOUNTER — Ambulatory Visit
Admission: RE | Admit: 2017-02-07 | Discharge: 2017-02-07 | Disposition: A | Discharge status: Home / Self Care | Payer: Medicare Other | Source: Ambulatory Visit | Attending: Radiation Oncology | Admitting: Radiation Oncology

## 2017-02-07 DIAGNOSIS — C50412 Malignant neoplasm of upper-outer quadrant of left female breast: Secondary | ICD-10-CM | POA: Diagnosis not present

## 2017-02-08 ENCOUNTER — Ambulatory Visit
Admission: RE | Admit: 2017-02-08 | Discharge: 2017-02-08 | Disposition: A | Discharge status: Home / Self Care | Payer: Medicare Other | Source: Ambulatory Visit | Attending: Radiation Oncology | Admitting: Radiation Oncology

## 2017-02-08 DIAGNOSIS — C50412 Malignant neoplasm of upper-outer quadrant of left female breast: Secondary | ICD-10-CM | POA: Diagnosis not present

## 2017-02-09 ENCOUNTER — Ambulatory Visit
Admission: RE | Admit: 2017-02-09 | Discharge: 2017-02-09 | Disposition: A | Discharge status: Home / Self Care | Payer: Medicare Other | Source: Ambulatory Visit | Attending: Radiation Oncology | Admitting: Radiation Oncology

## 2017-02-09 DIAGNOSIS — C50412 Malignant neoplasm of upper-outer quadrant of left female breast: Secondary | ICD-10-CM | POA: Diagnosis not present

## 2017-02-10 ENCOUNTER — Ambulatory Visit
Admission: RE | Admit: 2017-02-10 | Discharge: 2017-02-10 | Disposition: A | Discharge status: Home / Self Care | Payer: Medicare Other | Source: Ambulatory Visit | Attending: Radiation Oncology | Admitting: Radiation Oncology

## 2017-02-10 DIAGNOSIS — C50412 Malignant neoplasm of upper-outer quadrant of left female breast: Secondary | ICD-10-CM | POA: Diagnosis not present

## 2017-02-13 ENCOUNTER — Ambulatory Visit: Payer: Medicare Other

## 2017-02-13 ENCOUNTER — Ambulatory Visit
Admission: RE | Admit: 2017-02-13 | Discharge: 2017-02-13 | Disposition: A | Discharge status: Home / Self Care | Payer: Medicare Other | Source: Ambulatory Visit | Attending: Radiation Oncology | Admitting: Radiation Oncology

## 2017-02-13 DIAGNOSIS — C50412 Malignant neoplasm of upper-outer quadrant of left female breast: Secondary | ICD-10-CM | POA: Diagnosis not present

## 2017-02-14 ENCOUNTER — Ambulatory Visit
Admission: RE | Admit: 2017-02-14 | Discharge: 2017-02-14 | Disposition: A | Discharge status: Home / Self Care | Payer: Medicare Other | Source: Ambulatory Visit | Attending: Radiation Oncology | Admitting: Radiation Oncology

## 2017-02-14 ENCOUNTER — Ambulatory Visit: Payer: Medicare Other

## 2017-02-14 DIAGNOSIS — C50412 Malignant neoplasm of upper-outer quadrant of left female breast: Secondary | ICD-10-CM | POA: Diagnosis not present

## 2017-02-15 ENCOUNTER — Ambulatory Visit
Admission: RE | Admit: 2017-02-15 | Discharge: 2017-02-15 | Disposition: A | Discharge status: Home / Self Care | Payer: Medicare Other | Source: Ambulatory Visit | Attending: Radiation Oncology | Admitting: Radiation Oncology

## 2017-02-15 DIAGNOSIS — C50412 Malignant neoplasm of upper-outer quadrant of left female breast: Secondary | ICD-10-CM

## 2017-02-15 DIAGNOSIS — Z170 Estrogen receptor positive status [ER+]: Principal | ICD-10-CM

## 2017-02-15 MED ORDER — RADIAPLEXRX EX GEL
Freq: Once | CUTANEOUS | Status: AC
  Administered 2017-02-15: 09:00:00 via TOPICAL

## 2017-02-16 ENCOUNTER — Ambulatory Visit
Admission: RE | Admit: 2017-02-16 | Discharge: 2017-02-16 | Disposition: A | Discharge status: Home / Self Care | Payer: Medicare Other | Source: Ambulatory Visit | Attending: Radiation Oncology | Admitting: Radiation Oncology

## 2017-02-16 DIAGNOSIS — C50412 Malignant neoplasm of upper-outer quadrant of left female breast: Secondary | ICD-10-CM | POA: Diagnosis not present

## 2017-02-17 ENCOUNTER — Ambulatory Visit
Admission: RE | Admit: 2017-02-17 | Discharge: 2017-02-17 | Disposition: A | Discharge status: Home / Self Care | Payer: Medicare Other | Source: Ambulatory Visit | Attending: Radiation Oncology | Admitting: Radiation Oncology

## 2017-02-17 ENCOUNTER — Ambulatory Visit: Payer: Medicare Other

## 2017-02-17 DIAGNOSIS — C50412 Malignant neoplasm of upper-outer quadrant of left female breast: Secondary | ICD-10-CM | POA: Diagnosis not present

## 2017-02-20 ENCOUNTER — Ambulatory Visit (HOSPITAL_BASED_OUTPATIENT_CLINIC_OR_DEPARTMENT_OTHER): Payer: Medicare Other | Admitting: Hematology and Oncology

## 2017-02-20 ENCOUNTER — Other Ambulatory Visit: Payer: Self-pay | Admitting: Radiation Oncology

## 2017-02-20 ENCOUNTER — Telehealth: Payer: Self-pay | Admitting: *Deleted

## 2017-02-20 ENCOUNTER — Telehealth: Payer: Self-pay | Admitting: Hematology and Oncology

## 2017-02-20 ENCOUNTER — Ambulatory Visit: Payer: Medicare Other

## 2017-02-20 ENCOUNTER — Ambulatory Visit
Admission: RE | Admit: 2017-02-20 | Discharge: 2017-02-20 | Disposition: A | Discharge status: Home / Self Care | Payer: Medicare Other | Source: Ambulatory Visit | Attending: Radiation Oncology | Admitting: Radiation Oncology

## 2017-02-20 VITALS — BP 148/91 | HR 88 | Temp 97.9°F | Resp 17 | Wt 213.9 lb

## 2017-02-20 DIAGNOSIS — Z170 Estrogen receptor positive status [ER+]: Secondary | ICD-10-CM | POA: Diagnosis not present

## 2017-02-20 DIAGNOSIS — C50412 Malignant neoplasm of upper-outer quadrant of left female breast: Secondary | ICD-10-CM | POA: Diagnosis not present

## 2017-02-20 DIAGNOSIS — D229 Melanocytic nevi, unspecified: Secondary | ICD-10-CM

## 2017-02-20 MED ORDER — LETROZOLE 2.5 MG PO TABS: 2.5000 mg | ORAL_TABLET | Freq: Every day | ORAL | 3 refills | Status: DC

## 2017-02-20 NOTE — Assessment & Plan Note (Signed)
12/19/16: Left Lumpectomy: Grade 1 IDC, 0.6 cm, Er 100%, PR 100%, Her 2 Neg, Ki 67: 3%, T1bN0 (stage 1A) Adjuvant radiation therapy 01/24/2017-02/20/2017  Treatment plan: Adjuvant antiestrogen therapy with letrozole 2.5 mg daily to start 03/03/2017 Letrozole counseling: We discussed the risks and benefits of anti-estrogen therapy with aromatase inhibitors. These include but not limited to insomnia, hot flashes, mood changes, vaginal dryness, bone density loss, and weight gain. We strongly believe that the benefits far outweigh the risks. Patient understands these risks and consented to starting treatment. Planned treatment duration is 7 years.  Return to clinic in 3 months for survivorship care plan visit

## 2017-02-20 NOTE — Telephone Encounter (Signed)
Patient declined AVs and calendar of upcoming May appointments.

## 2017-02-20 NOTE — Progress Notes (Signed)
Patient Care Team: Antony Contras, MD as PCP - General (Family Medicine)  DIAGNOSIS:  Encounter Diagnosis  Name Primary?  . Malignant neoplasm of upper-outer quadrant of left breast in female, estrogen receptor positive ([place])     SUMMARY OF ONCOLOGIC HISTORY:   Malignant neoplasm of upper-outer quadrant of left breast in female, estrogen receptor positive ([place])   12/02/2016 Initial Diagnosis    IDC grade 1 ER/PR pos, Her 2 Neg      12/19/2016 Surgery    Left Lumpectomy: Grade 1 IDC, 0.6 cm, Er 100%, PR 100%, Her 2 Neg, Ki 67: 3%, T1bN0 (stage 1A)      01/24/2017 - 02/20/2017 Radiation Therapy    Adjuvant radiation therapy       CHIEF COMPLIANT: Follow-up after completion of radiation therapy  INTERVAL HISTORY: Virginia Lindsey is a 67-year with above-mentioned history left breast cancer treated with lumpectomy radiation is currently on adjuvant radiation.  Today's last day of radiation.  She is here today to discuss the subsequent treatment plan with antiestrogen therapy.  She has apparently been scheduled for genetic testing because of her extensive family history of melanoma and a personal history of breast cancer.  This is happening in March.  REVIEW OF SYSTEMS:   Constitutional: Denies fevers, chills or abnormal weight loss Eyes: Denies blurriness of vision Ears, nose, mouth, throat, and face: Denies mucositis or sore throat Respiratory: Denies cough, dyspnea or wheezes Cardiovascular: Denies palpitation, chest discomfort Gastrointestinal:  Denies nausea, heartburn or change in bowel habits Skin: Denies abnormal skin rashes Lymphatics: Denies new lymphadenopathy or easy bruising Neurological:Denies numbness, tingling or new weaknesses Behavioral/Psych: Mood is stable, no new changes  Extremities: No lower extremity edema Breast:  denies any pain or lumps or nodules in either breasts All other systems were reviewed with the patient and are negative.  I have reviewed  the past medical history, past surgical history, social history and family history with the patient and they are unchanged from previous note.  ALLERGIES:  is allergic to codeine; hydrocodone; and latex.  MEDICATIONS:  Current Outpatient Medications  Medication Sig Dispense Refill  . acetaminophen (TYLENOL) 325 MG tablet Take 650 mg by mouth every 6 (six) hours as needed.    . ondansetron (ZOFRAN) 4 MG tablet Take 1 tablet (4 mg total) by mouth daily as needed for nausea or vomiting. (Patient not taking: Reported on 12/28/2016) 10 tablet 0  . traMADol (ULTRAM) 50 MG tablet Take 1 tablet (50 mg total) by mouth every 6 (six) hours as needed for severe pain. (Patient not taking: Reported on 12/28/2016) 15 tablet 1   No current facility-administered medications for this visit.     PHYSICAL EXAMINATION: ECOG PERFORMANCE STATUS: 1 - Symptomatic but completely ambulatory  Vitals:   02/20/17 0938  BP: (!) 148/91  Pulse: 88  Resp: 17  Temp: 97.9 F (36.6 C)  SpO2: 100%   Filed Weights   02/20/17 0938  Weight: 213 lb 14.4 oz (97 kg)    GENERAL:alert, no distress and comfortable SKIN: skin color, texture, turgor are normal, no rashes or significant lesions EYES: normal, Conjunctiva are pink and non-injected, sclera clear OROPHARYNX:no exudate, no erythema and lips, buccal mucosa, and tongue normal  NECK: supple, thyroid normal size, non-tender, without nodularity LYMPH:  no palpable lymphadenopathy in the cervical, axillary or inguinal LUNGS: clear to auscultation and percussion with normal breathing effort HEART: regular rate & rhythm and no murmurs and no lower extremity edema ABDOMEN:abdomen soft, non-tender and  normal bowel sounds MUSCULOSKELETAL:no cyanosis of digits and no clubbing  NEURO: alert & oriented x 3 with fluent speech, no focal motor/sensory deficits EXTREMITIES: No lower extremity edema  LABORATORY DATA:  I have reviewed the data as listed CMP Latest Ref Rng &  Units 08/23/2012  Glucose 70 - 99 mg/dL 91  BUN 6 - 23 mg/dL 12  Creatinine 0.50 - 1.10 mg/dL 0.80  Sodium 135 - 145 mEq/L 142  Potassium 3.5 - 5.1 mEq/L 3.5  Chloride 96 - 112 mEq/L 105  CO2 19 - 32 mEq/L 27  Calcium 8.4 - 10.5 mg/dL 9.4    Lab Results  Component Value Date   WBC 8.8 08/23/2012   HGB 13.7 08/23/2012   HCT 39.1 08/23/2012   MCV 90.3 08/23/2012   PLT 320 08/23/2012    ASSESSMENT & PLAN:  Malignant neoplasm of upper-outer quadrant of left breast in female, estrogen receptor positive ([place]) 12/19/16: Left Lumpectomy: Grade 1 IDC, 0.6 cm, Er 100%, PR 100%, Her 2 Neg, Ki 67: 3%, T1bN0 (stage 1A) Adjuvant radiation therapy 01/24/2017-02/20/2017  Treatment plan: Adjuvant antiestrogen therapy with letrozole 2.5 mg daily to start 03/03/2017 Letrozole counseling: We discussed the risks and benefits of anti-estrogen therapy with aromatase inhibitors. These include but not limited to insomnia, hot flashes, mood changes, vaginal dryness, bone density loss, and weight gain. We strongly believe that the benefits far outweigh the risks. Patient understands these risks and consented to starting treatment. Planned treatment duration is 7 years.  Return to clinic in 3 months for survivorship care plan visit  I spent 25 minutes talking to the patient of which more than half was spent in counseling and coordination of care.  No orders of the defined types were placed in this encounter.  The patient has a good understanding of the overall plan. she agrees with it. she will call with any problems that may develop before the next visit here.   Harriette Ohara, MD 02/20/17

## 2017-02-20 NOTE — Telephone Encounter (Signed)
INFORMED PATIENT OF AN APPT. WITH [facility]ER[facility]AL[facility]IST [facility] ON 05-16-17 - ARRIVAL TIME - 9:30 AM, SPOKE WITH PATIENT AND SHE IS AWARE OF THIS APPT.

## 2017-02-21 ENCOUNTER — Encounter: Payer: Self-pay | Admitting: Radiation Oncology

## 2017-02-21 NOTE — Progress Notes (Signed)
  Radiation Oncology         [phone] ________________________________  Name: Virginia Lindsey MRN: [id number]  Date: 02/21/2017  DOB: 1950-06-18  End of Treatment Note  Diagnosis:   Stage pT1b pN0 cM0 Left Breast UOQ Invasive Ductal Carcinoma, ER+ / PR+ / Her2(-), Grade I/III     Indication for treatment:  Curative       Radiation treatment dates:   01/24/17 - 02/20/17  Site/dose:   1) Left breast treated to 40.05 Gy with 15 fx of 2.67 Gy and a 2) boost of 10 Gy with 5 fx of 2 Gy  Beams/energy:   1) 3D / 10X 2) photons / 10X  Narrative: The patient tolerated radiation treatment relatively well.   Patient endorsed mild fatigue and use of radiaplex throughout treatment.  Plan: The patient has completed radiation treatment. The patient will return to radiation oncology clinic for routine followup in one month. I advised them to call or return sooner if they have any questions or concerns related to their recovery or treatment.  -----------------------------------  Eppie Gibson, MD   This document serves as a record of services personally performed by Eppie Gibson, MD. It was created on his behalf by Linward Natal, a trained medical scribe. The creation of this record is based on the scribe's personal observations and the provider's statements to them. This document has been checked and approved by the attending provider.

## 2017-02-24 ENCOUNTER — Encounter: Payer: Self-pay | Admitting: *Deleted

## 2017-03-06 ENCOUNTER — Encounter: Payer: Self-pay | Admitting: Genetic Counselor

## 2017-03-06 ENCOUNTER — Inpatient Hospital Stay: Payer: Medicare Other | Attending: Genetic Counselor | Admitting: Genetic Counselor

## 2017-03-06 ENCOUNTER — Inpatient Hospital Stay: Payer: Medicare Other

## 2017-03-06 DIAGNOSIS — Z8051 Family history of malignant neoplasm of kidney: Secondary | ICD-10-CM | POA: Diagnosis not present

## 2017-03-06 DIAGNOSIS — Z800 Family history of malignant neoplasm of digestive organs: Secondary | ICD-10-CM

## 2017-03-06 DIAGNOSIS — Z315 Encounter for genetic counseling: Secondary | ICD-10-CM

## 2017-03-06 DIAGNOSIS — Z803 Family history of malignant neoplasm of breast: Secondary | ICD-10-CM | POA: Diagnosis not present

## 2017-03-06 DIAGNOSIS — C50412 Malignant neoplasm of upper-outer quadrant of left female breast: Secondary | ICD-10-CM

## 2017-03-06 DIAGNOSIS — Z170 Estrogen receptor positive status [ER+]: Secondary | ICD-10-CM

## 2017-03-06 DIAGNOSIS — Z808 Family history of malignant neoplasm of other organs or systems: Secondary | ICD-10-CM | POA: Insufficient documentation

## 2017-03-06 NOTE — Progress Notes (Signed)
REFERRING PROVIDER: Nicholas Lose, MD [address] [place], [place] [address]  PRIMARY PROVIDER:  Antony Contras, MD  PRIMARY REASON FOR VISIT:  1. Malignant neoplasm of upper-outer quadrant of left breast in female, estrogen receptor positive ([place])   2. Family history of breast cancer   3. Family history of melanoma   4. Family history of renal cancer      HISTORY OF PRESENT ILLNESS:   Virginia Lindsey, a 67 y.o. female, was seen for a Virginia Lindsey cancer genetics consultation at the request of Dr. Lindi Adie due to a personal and family history of cancer.  Virginia Lindsey presents to clinic today to discuss the possibility of a hereditary predisposition to cancer, genetic testing, and to further clarify her future cancer risks, as well as potential cancer risks for family members.   In 2018, at the age of 84, Virginia Lindsey was diagnosed with invasive ductal carcinoma of the left breast. The tumor is ER+/PR+/Her2-. This was treated with lumpectomy, radiation and letrozole.     CANCER HISTORY:    Malignant neoplasm of upper-outer quadrant of left breast in female, estrogen receptor positive ([place])   12/02/2016 Initial Diagnosis    IDC grade 1 ER/PR pos, Her 2 Neg      12/19/2016 Surgery    Left Lumpectomy: Grade 1 IDC, 0.6 cm, Er 100%, PR 100%, Her 2 Neg, Ki 67: 3%, T1bN0 (stage 1A)      01/24/2017 - 02/20/2017 Radiation Therapy    Adjuvant radiation therapy        HORMONAL RISK FACTORS:  Menarche was at age 29-13.  First live birth at age 58.  OCP use for approximately 0 years.  Ovaries intact: one is intact.  Hysterectomy: yes.  Menopausal status: postmenopausal.  HRT use: 0 years. Colonoscopy: yes; between 3-6 polyps. Mammogram within the last year: yes. Number of breast biopsies: 2. Up to date with pelvic exams:  yes. Any excessive radiation exposure in the past:  no  Past Medical History:  Diagnosis Date  . Back pain   . Breast cancer ([place])   . Family history of  breast cancer   . Family history of melanoma   . Family history of renal cancer   . PONV (postoperative nausea and vomiting)   . Seizures ([place])    last seizure 2004  3 total 10 yrs apart  . Wears glasses     Past Surgical History:  Procedure Laterality Date  . ABDOMINAL HYSTERECTOMY  1991   partial hysterectomy  . BREAST BIOPSY Left 10/29/2012   Procedure:  LEFT BREAST WIRE GUIDED EXCISION;  Surgeon: Rolm Bookbinder, MD;  Location: [facility];  Service: General;  Laterality: Left;  . BREAST EXCISIONAL BIOPSY    . BREAST LUMPECTOMY WITH RADIOACTIVE SEED AND SENTINEL LYMPH NODE BIOPSY Left 12/19/2016   Procedure: LEFT BREAST LUMPECTOMY WITH RADIOACTIVE SEED AND LEFT AXILLARY SENTINEL LYMPH NODE BIOPSY;  Surgeon: Rolm Bookbinder, MD;  Location: [facility];  Service: General;  Laterality: Left;  . BREAST SURGERY  2013   biopsy-lt  . CHOLECYSTECTOMY    . COLONOSCOPY    . LUMBAR LAMINECTOMY/DECOMPRESSION MICRODISCECTOMY Right 08/27/2012   Procedure: RIGHT  LUMBAR FOUR FIVE LAMINECTOMY/DECOMPRESSION MICRODISCECTOMY 1 LEVEL;  Surgeon: Elaina Hoops, MD;  Location: [facility];  Service: Neurosurgery;  Laterality: Right;  . UPPER GI ENDOSCOPY     multiple endoscopy's    Social History   Socioeconomic History  . Marital status: Married    Spouse  name: Not on file  . Number of children: 4  . Years of education: Not on file  . Highest education level: Not on file  Social Needs  . Financial resource strain: Not on file  . Food insecurity - worry: Not on file  . Food insecurity - inability: Not on file  . Transportation needs - medical: Not on file  . Transportation needs - non-medical: Not on file  Occupational History  . Not on file  Tobacco Use  . Smoking status: Never Smoker  . Smokeless tobacco: Never Used  Substance and Sexual Activity  . Alcohol use: No  . Drug use: No  . Sexual activity: Not on file  Other Topics Concern  . Not on file   Social History Narrative  . Not on file     FAMILY HISTORY:  We obtained a detailed, 4-generation family history.  Significant diagnoses are listed below: Family History  Problem Relation Age of Onset  . Asthma Mother   . Cancer Mother        renal cell carcinoma, dx in her 44s, d. 18  . Cancer Father 47       melanoma on face; d. 22  . Diabetes Maternal Grandmother   . Breast cancer Sister 34       d. 44  . Breast cancer Paternal Aunt        dbl mastectomy  . Cancer Paternal Uncle        NOS  . Melanoma Paternal Aunt   . Melanoma Cousin        pat first cousin; daughter of aunt with melanoma    The patient has four children, three boys and a girl, who are all cancer free.  She has three sisters.  One sister died of breast cancer at 24.  A second sister is being followed every 6 months from a questionable mammogram, and her third sister is coming in on Wednesday for a breast biopsy, due to a mammogram that reports suspicious findings.  Both parents are deceased.  The patient's mother was diagnosed with renal cancer in her 63's and died at 93.  She has two full brothers and a full sister, and a paternal half sister.  None had cancer.  The maternal grandparents are both deceased from non cancer related issues.  The patient's father died from melanoma.  He had three brothers and two sisters.  One brother died of cancer, one sister had breast cancer and had a double mastectomy, and one sister died of melanoma.  This sister had a daughter who had melanoma and died.  The paternal grandparents are deceased.  Virginia Lindsey is unaware of previous family history of genetic testing for hereditary cancer risks. Patient's maternal ancestors are of Caucasian descent, and paternal ancestors are of Caucasian descent. There is no reported Ashkenazi Jewish ancestry. There is no known consanguinity.  GENETIC COUNSELING ASSESSMENT: Virginia Lindsey is a 67 y.o. female with a personal and family history  of breast cancer and melanoma which is somewhat suggestive of a hereditary cancer syndrome and predisposition to cancer. We, therefore, discussed and recommended the following at today's visit.   DISCUSSION: We discussed that about 5-10% pf breast cancer is hereditary with most cases due to BRCA mutations.  There are other genes associated with hereditary breast cancer syndromes including ATM, CHEK2 and PALB2. We also discussed hereditary melanoma syndromes based on three individuals in the family with melanoma.  Despite the multiple cancers in the family,  the ages of onset are such when cancer is more typical, and not seen in early ages. We reviewed the characteristics, features and inheritance patterns of hereditary cancer syndromes. We also discussed genetic testing, including the appropriate family members to test, the process of testing, insurance coverage and turn-around-time for results. We discussed the implications of a negative, positive and/or variant of uncertain significant result. We recommended Virginia Lindsey pursue genetic testing for the multi-cancer gene panel. The Multi-Gene Panel offered by [facility] includes sequencing and/or deletion duplication testing of the following 83 genes: ALK, APC, ATM, AXIN2,BAP1,  BARD1, BLM, BMPR1A, BRCA1, BRCA2, BRIP1, CASR, CDC73, CDH1, CDK4, CDKN1B, CDKN1C, CDKN2A (p14ARF), CDKN2A (p16INK4a), CEBPA, CHEK2, CTNNA1, DICER1, DIS3L2, EGFR (c.2369C>T, p.Thr790Met variant only), EPCAM (Deletion/duplication testing only), FH, FLCN, GATA2, GPC3, GREM1 (Promoter region deletion/duplication testing only), HOXB13 (c.251G>A, p.Gly84Glu), HRAS, KIT, MAX, MEN1, MET, MITF (c.952G>A, p.Glu318Lys variant only), MLH1, MSH2, MSH3, MSH6, MUTYH, NBN, NF1, NF2, NTHL1, PALB2, PDGFRA, PHOX2B, PMS2, POLD1, POLE, POT1, PRKAR1A, PTCH1, PTEN, RAD50, RAD51C, RAD51D, RB1, RECQL4, RET, RUNX1, SDHAF2, SDHA (sequence changes only), SDHB, SDHC, SDHD, SMAD4, SMARCA4, SMARCB1, SMARCE1, STK11, SUFU,  TERT, TERT, TMEM127, TP53, TSC1, TSC2, VHL, WRN and WT1.    Based on Virginia Lindsey's personal and family history of cancer, she meets medical criteria for genetic testing. Despite that she meets criteria, she may still have an out of pocket cost. We discussed that if her out of pocket cost for testing is over $100, the laboratory will call and confirm whether she wants to proceed with testing.  If the out of pocket cost of testing is less than $100 she will be billed by the genetic testing laboratory.   PLAN: After considering the risks, benefits, and limitations, Virginia Lindsey  provided informed consent to pursue genetic testing and the blood sample was sent to [facility] for analysis of the Multi-cancer gene panel. Results should be available within approximately 2-3 weeks' time, at which point they will be disclosed by telephone to Virginia Lindsey, as will any additional recommendations warranted by these results. Virginia Lindsey will receive a summary of her genetic counseling visit and a copy of her results once available. This information will also be available in Epic. We encouraged Virginia Lindsey to remain in contact with cancer genetics annually so that we can continuously update the family history and inform her of any changes in cancer genetics and testing that may be of benefit for her family. Virginia Lindsey questions were answered to her satisfaction today. Our contact information was provided should additional questions or concerns arise.  Lastly, we encouraged Virginia Lindsey to remain in contact with cancer genetics annually so that we can continuously update the family history and inform her of any changes in cancer genetics and testing that may be of benefit for this family.   Ms.  Lindsey questions were answered to her satisfaction today. Our contact information was provided should additional questions or concerns arise. Thank you for the referral and allowing [place] to share in the care of your patient.    Virginia Lindsey P. Florene Glen, [place], [facility] Certified Genetic Counselor [email or web address] phone: [phone]  The patient was seen for a total of 45 minutes in face-to-face genetic counseling.  This patient was discussed with Drs. Magrinat, Lindi Adie and/or Burr Medico who agrees with the above.    _______________________________________________________________________ For Office Staff:  Number of people involved in session: 1 Was an Intern/ student involved with case: no

## 2017-03-17 ENCOUNTER — Other Ambulatory Visit: Payer: Self-pay | Admitting: Hematology and Oncology

## 2017-03-17 DIAGNOSIS — Z853 Personal history of malignant neoplasm of breast: Secondary | ICD-10-CM

## 2017-03-20 ENCOUNTER — Other Ambulatory Visit: Payer: Self-pay | Admitting: Hematology and Oncology

## 2017-03-20 DIAGNOSIS — Z853 Personal history of malignant neoplasm of breast: Secondary | ICD-10-CM

## 2017-03-21 ENCOUNTER — Telehealth: Payer: Self-pay | Admitting: *Deleted

## 2017-03-21 ENCOUNTER — Ambulatory Visit
Admission: RE | Admit: 2017-03-21 | Discharge: 2017-03-21 | Disposition: A | Discharge status: Home / Self Care | Payer: Medicare Other | Source: Ambulatory Visit | Attending: Hematology and Oncology | Admitting: Hematology and Oncology

## 2017-03-21 DIAGNOSIS — Z853 Personal history of malignant neoplasm of breast: Secondary | ICD-10-CM

## 2017-03-21 NOTE — Telephone Encounter (Signed)
CALLED PATIENT TO ASK ABOUT SWITCHING FU FROM 10:20 AM TO 9:30 AM, PATIENT AGREED TO COME IN @ 9:30 AM

## 2017-03-22 ENCOUNTER — Encounter: Payer: Self-pay | Admitting: Genetic Counselor

## 2017-03-22 DIAGNOSIS — Z1379 Encounter for other screening for genetic and chromosomal anomalies: Secondary | ICD-10-CM | POA: Insufficient documentation

## 2017-03-23 ENCOUNTER — Encounter: Payer: Self-pay | Admitting: Genetic Counselor

## 2017-03-23 ENCOUNTER — Inpatient Hospital Stay: Payer: Medicare Other | Admitting: Genetic Counselor

## 2017-03-23 DIAGNOSIS — Z170 Estrogen receptor positive status [ER+]: Secondary | ICD-10-CM

## 2017-03-23 DIAGNOSIS — Z1379 Encounter for other screening for genetic and chromosomal anomalies: Secondary | ICD-10-CM

## 2017-03-23 DIAGNOSIS — C50412 Malignant neoplasm of upper-outer quadrant of left female breast: Secondary | ICD-10-CM

## 2017-03-23 DIAGNOSIS — Z808 Family history of malignant neoplasm of other organs or systems: Secondary | ICD-10-CM

## 2017-03-23 DIAGNOSIS — Z803 Family history of malignant neoplasm of breast: Secondary | ICD-10-CM

## 2017-03-23 DIAGNOSIS — Z8051 Family history of malignant neoplasm of kidney: Secondary | ICD-10-CM

## 2017-03-23 NOTE — Progress Notes (Signed)
GENETIC TEST RESULTS   Patient Name: Virginia Lindsey Patient Age: 67 y.o. Encounter Date: 03/23/2017  Referring Provider: Nicholas Lose, MD  Rolm Bookbinder, MD    [name] was seen in the [facility] [place] clinic on March 23, 2017 due to a personal and family history of cancer and concern regarding a hereditary predisposition to cancer in the family. Please refer to the prior Genetics clinic note for more information regarding Virginia Lindsey's medical and family histories and our assessment at the time.   FAMILY HISTORY:  We obtained a detailed, 4-generation family history.  Significant diagnoses are listed below: Family History  Problem Relation Age of Onset  . Asthma Mother   . Cancer Mother        renal cell carcinoma, dx in her 51s, d. 90  . Cancer Father 53       melanoma on face; d. 51  . Diabetes Maternal Grandmother   . Breast cancer Sister 46       d. 60  . Breast cancer Paternal Aunt 74       dbl mastectomy  . Cancer Paternal Uncle        NOS  . Melanoma Paternal Aunt   . Melanoma Cousin        pat first cousin; daughter of aunt with melanoma  . Lymphoma Son 4       Burkett Lymphoma    The patient has four children, three boys and a girl.  Her second son was diagnosed with Burkett Lymphoma at age 41, but is now cancer free.  Her other children are all cancer free.  She has three sisters.  One sister died of breast cancer at 54.  A second sister is being followed every 6 months from a questionable mammogram, and her third sister is coming in on Wednesday for a breast biopsy, due to a mammogram that reports suspicious findings.  Both parents are deceased.  The patient's mother was diagnosed with renal cancer in her 23's and died at 18.  She has two full brothers and a full sister, and a paternal half sister.  None had cancer.  The maternal grandparents are both deceased from non cancer related issues.  The patient's father died from melanoma.  He had three brothers  and two sisters.  One brother died of cancer, one sister had breast cancer at age 33 and had a double mastectomy, and one sister died of melanoma.  This sister had a daughter who had melanoma and died.  The paternal grandparents are deceased.  Virginia Lindsey is unaware of previous family history of genetic testing for hereditary cancer risks. Patient's maternal ancestors are of Caucasian descent, and paternal ancestors are of Caucasian descent. There is no reported Ashkenazi Jewish ancestry. There is no known consanguinity.  GENETIC TESTING:  At the time of Virginia Lindsey's visit, we recommended she pursue genetic testing of the common hereditary cancer gene panel. This test, which included sequencing and deletion/duplication analysis of 47 genes, was performed at [facility]. 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 This gene mutation increases your risk to develop melanoma and kidney cancer.  There were no genetic changes in the following genes:   ALK, APC, ATM, AXIN2,BAP1,  BARD1, BLM, BMPR1A, BRCA1, BRCA2, BRIP1, CASR, CDC73, CDH1, CDK4, CDKN1B, CDKN1C, CDKN2A (p14ARF), CDKN2A (p16INK4a), CEBPA, CHEK2, CTNNA1, DICER1, DIS3L2, EGFR (c.2369C>T, p.Thr790Met variant only), EPCAM (Deletion/duplication testing only), FH, FLCN, GATA2, GPC3, GREM1 (Promoter region deletion/duplication testing only), HOXB13 (c.251G>A,  p.Gly84Glu), HRAS, KIT, MAX, MEN1, MET, MLH1, MSH2, MSH3, MSH6, MUTYH, NBN, NF1, NF2, NTHL1, PALB2, PDGFRA, PHOX2B, PMS2, POLD1, POLE, POT1, PRKAR1A, PTCH1, PTEN, RAD50, RAD51C, RAD51D, RB1, RECQL4, RET, RUNX1, SDHAF2, SDHA (sequence changes only), SDHB, SDHC, SDHD, SMAD4, SMARCA4, SMARCB1, SMARCE1, STK11, SUFU, TERT, TERT, TMEM127, TP53, TSC1, TSC2, VHL, WRN and WT1.      Clinical condition Malignant melanoma is a neoplasm, or cancer, of melanocytes, the cells that produce pigment. Melanoma most often occurs in the skin, but may also affect the eyes, ears,  gastrointestinal tract, and oral and genital membranes. Cutaneous melanoma is considered the most lethal skin cancer if not detected and treated during its early stages (PMID: 95284132). Approximately 5-10% of cases are familial (PMID: 44010272). The c.952G>A (p.Glu318Lys) variant in MITF, also known as E318K, is associated with an increased risk of melanoma (PMID: 53664403, 47425956, 38756433, 29518841, 66063016, 01093235, 57322025). The risks are not yet established; however, studies suggest the risk may be 3- to 5-fold higher than the general population risk (PMID: 42706237, 62831517). This variant has been associated with features including high nevi count (>200), fair skin, non-blue eye color, and early-onset melanoma (under age 52) (PMID: 61607371, 06269485, 46270350, 09381829). Additionally, there is evidence to suggest this variant may predispose to fast-growing melanomas (PMID: 93716967).  Studies showed an overrepresentation of renal cell carcinoma in individuals with this variant (PMID: 89381017, 51025852, 77824235, 36144315, 40086761 ); however, the studies were performed on relatively small patient populations and these findings have not been independently replicated. Therefore, the risk for renal cancer in individuals with the MITF E318K variant is currently unknown.  MANAGEMENT:  While there remains lack of a clear consensus on dermatologic management and surveillance guidelines for individuals at increased risk for melanoma, heightened screening may result in early detection and removal of cutaneous lesions at premalignant or early stages, associated with a more favorable prognosis (PMID: 95093267).  There are currently no published medical management guidelines for individuals at increased cancer risk due to a MITF mutation. We expect that medical management guidelines for the MITF gene may become available over time, thus we encouraged Virginia Lindsey to contact us regularly for any updates. While  there are no established screening or surveillance guidelines for individuals with the pathogenic E318K variant in MITF, the following recommendations have been suggested (PMID: 12458099, 83382505):  Regarding melanoma risk management: Follow melanoma prevention programs that include:  Sun protection strategies  Regular formal dermatologic evaluations  Monthly self-examination of the skin  Consider immediate and urgent dermatologic follow-up for any new lesions due to evidence suggesting the E318K variant may be associated with fast-growing melanomas (PMID: 39767341).  Screening of all family members  Because data suggests those who test negative for a familial variant may still have an increased risk of developing melanoma (due to other shared and environmental risk factors), such relatives should remain under careful dermatologic surveillance and strict sun protection (PMID: 93790240, 97353299).  An individual's cancer risk and medical management are not determined by genetic test results alone. Overall cancer risk assessment incorporates additional factors, including personal medical history, family history, and any available genetic information that may result in a personalized plan for cancer prevention and surveillance.  Ms. Cronk does not have a dermatologist. She asked for a dermatologist recommendation that is part of the [facility] system.  I provided her the name of Jens Som, MD and Hortencia Pilar, [facility].   Regarding kidney cancer risk management: We suggest at least annual imaging (as determined by  your Urologist) for kidney tumors and regular clinical exams with a Urologist.  We discussed that many times imaging of the kidneys can find unexpected findings, many times that are benign.  Therefore, if renal screening commences patients should expect that any findings will need to be followed up upon and tracked.  Ms. Swing not have a urologist.  We recommend contacting  [facility] at [phone].  FAMILY MEMBERS: Even though data regarding MITF is preliminary, knowing if a pathogenic variant is present is advantageous. At-risk relatives can be identified, enabling pursuit of a diagnostic evaluation. Further, the available information regarding hereditary cancer susceptibility genes is constantly evolving and more clinically relevant data regarding MITF are likely to become available in the near future. Awareness of this cancer predisposition encourages patients and their providers to inform at-risk family members, to consider implementing proposed screening protocols, and to be vigilant in maintaining close and regular contact with their local genetics clinic in anticipation of new information.It is important that all of Ms. Guiffre's relatives (both men and women) know of the presence of this gene mutation. Site-specific genetic testing can sort out who in the family is at risk and who is not.   Ms. Mcgeehan children and siblings have a 50% chance to have inherited this mutation. We recommend they have genetic testing for this same mutation, as identifying the presence of this mutation would allow them to also take advantage of risk-reducing measures.   Summary: The information we discussed at the time of Ms. Mercadel's appointment and which is summarized in this letter is what is known as of this date. With the rapid pace of medical research, new discoveries may modify our assessment and approach to her family in the future. We, therefore, encourage Ms. Strutz's to re-contact us periodically for information on advances in the field. Ms. Urbieta may also call us with additional questions or updates regarding her personal and family cancer history.  Our contact number was provided. Ms. Aguado questions were answered to her satisfaction today, and she knows she is welcome to call anytime with additional questions.   Revecca Nachtigal P. Florene Glen, [place], [facility] Certified Genetic  Counselor [email or web address] phone: [phone]

## 2017-03-24 ENCOUNTER — Other Ambulatory Visit: Payer: Self-pay

## 2017-03-24 ENCOUNTER — Encounter: Payer: Self-pay | Admitting: Radiation Oncology

## 2017-03-24 ENCOUNTER — Ambulatory Visit
Admission: RE | Admit: 2017-03-24 | Discharge: 2017-03-24 | Disposition: A | Discharge status: Home / Self Care | Payer: Medicare Other | Source: Ambulatory Visit | Attending: Radiation Oncology | Admitting: Radiation Oncology

## 2017-03-24 VITALS — BP 156/94 | HR 71 | Temp 98.5°F | Ht 64.0 in | Wt 213.6 lb

## 2017-03-24 DIAGNOSIS — Z79811 Long term (current) use of aromatase inhibitors: Secondary | ICD-10-CM | POA: Insufficient documentation

## 2017-03-24 DIAGNOSIS — Z170 Estrogen receptor positive status [ER+]: Secondary | ICD-10-CM | POA: Diagnosis present

## 2017-03-24 DIAGNOSIS — C50412 Malignant neoplasm of upper-outer quadrant of left female breast: Secondary | ICD-10-CM

## 2017-03-24 DIAGNOSIS — Z79899 Other long term (current) drug therapy: Secondary | ICD-10-CM | POA: Diagnosis not present

## 2017-03-24 DIAGNOSIS — Z885 Allergy status to narcotic agent status: Secondary | ICD-10-CM | POA: Diagnosis not present

## 2017-03-24 DIAGNOSIS — Z923 Personal history of irradiation: Secondary | ICD-10-CM | POA: Insufficient documentation

## 2017-03-24 DIAGNOSIS — Z1509 Genetic susceptibility to other malignant neoplasm: Secondary | ICD-10-CM

## 2017-03-24 NOTE — Progress Notes (Signed)
Radiation Oncology         [phone] ________________________________  Name: Virginia Lindsey MRN: [id number]  Date: 03/24/2017  DOB: 13-Dec-1950  Follow-Up Visit Note  Outpatient  CC: Virginia Contras, MD  Virginia Contras, MD  Diagnosis and Prior Radiotherapy:    ICD-10-CM   1. Malignant neoplasm of upper-outer quadrant of left breast in female, estrogen receptor positive ([place]) C50.412    Z17.0     StagepT1bpN0 cM0LeftBreast UOQ Invasive Ductal Carcinoma, ER(+)/ PR(+)/ Her2(-), GradeI/III    Radiation treatment dates:   01/24/2017 - 02/20/2017 Site/dose:   1) Left Breast / 40.05 Gy in 15 fractions of 2.67 Gy 2) Left Breast Boost / 10 Gy in 5 fractions of 2 Gy  CHIEF COMPLAINT: Here for follow-up and surveillance of left breast cancer  Narrative:  The patient returns today accompanied by her husband for routine follow-up of radiation completed 1 month ago to her left breast. She denies pain or fatigue. She reports her left breast has healed well, and she continues to use Radiaplex twice daily. She will switch to a vitamin E cream when the Radiaplex tube is completed. She is taking Letrozole without difficulty. She will see Survivorship on 05/18/2017.   Results from genetic testing reviewed.               ALLERGIES:  is allergic to codeine; hydrocodone; and latex.  Meds: Current Outpatient Medications  Medication Sig Dispense Refill  . acetaminophen (TYLENOL) 325 MG tablet Take 650 mg by mouth every 6 (six) hours as needed.    Marland Kitchen letrozole (FEMARA) 2.5 MG tablet Take 1 tablet (2.5 mg total) by mouth daily. 90 tablet 3   No current facility-administered medications for this encounter.     Physical Findings:  Patient is alert and oriented.  height is _0  (1.626 m) and weight is 213 lb 9.6 oz (96.9 kg). Her temperature is 98.5 F (36.9 C). Her blood pressure is 156/94 (abnormal) and her pulse is 71. Her oxygen saturation is 99%.    Satisfactory skin healing in  radiotherapy fields.  Benign appears moles over left breast.    Lab Findings: Lab Results  Component Value Date   WBC 8.8 08/23/2012   HGB 13.7 08/23/2012   HCT 39.1 08/23/2012   MCV 90.3 08/23/2012   PLT 320 08/23/2012    Radiographic Findings: Dg Bone Density  Result Date: 03/21/2017 EXAM: DUAL X-RAY ABSORPTIOMETRY (DXA) FOR BONE MINERAL DENSITY IMPRESSION: Referring Physician:  Nicholas Lindsey PATIENT: Name: Virginia Lindsey Patient ID:  [id number] Birth Date: 1950/06/18 Height:     63.3 in. Sex:         Female Measured:   03/21/2017 Weight:     215.3 lbs. Indications: Breast Cancer History, Caucasian, Estrogen Deficient, Hysterectomy, Letrozole, Postmenopausal Fractures: None Treatments: Hormone Therapy For Cancer ASSESSMENT: The BMD measured at Femur Neck Right is 0.907 g/cm2 with a T-score of -0.9. This patient is considered NORMAL according to [employer] [facility]) criteria. L-3  was excluded due to degenerative changes. Site Region Measured Date Measured Age [age]A T-score BMD Significant CHANGE DualFemur Neck Right 1/21/2017    69.1         -0.9    0.907 g/cm2 AP Spine  L1-L4 (L3) 1/21/2017    69.1         -0.1    1.157 g/cm2 World Health Organization [facility]) criteria for post-menopausal, Caucasian Women: Normal       T-score at or above -1 SD Osteopenia  T-score between -1 and -2.5 SD Osteoporosis T-score at or below -2.5 SD RECOMMENDATION: [place] recommends that FDA-approved medical therapies be considered in postmenopausal women and men age 64 or older with a: 1. Hip or vertebral (clinical or morphometric) fracture. 2. T-score of less than or equal to -2.5 at the spine or hip. 3. Ten-year fracture probability by FRAX of 3% or greater for hip fracture or 20% or greater for major osteoporotic fracture. All treatment decisions require clinical judgment and consideration of individual patient factors, including patient preferences, co-morbidities, previous drug  use, risk factors not captured in the FRAX model (e.g. falls, vitamin D deficiency, increased bone turnover, interval significant decline in bone density) and possible under- or over-estimation of fracture risk by FRAX. All patients should ensure an adequate intake of dietary calcium (1200 mg/d) and vitamin D (800 IU daily) unless contraindicated. FOLLOW-UP: People with diagnosed cases of osteoporosis or at high risk for fracture should have regular bone mineral density tests. For patients eligible for Medicare, routine testing is allowed once every 2 years. The testing frequency can be increased to one year for patients who have rapidly progressing disease, those who are receiving or discontinuing medical therapy to restore bone mass, or have additional risk factors. I have reviewed this report and agree with the above finding. Virginia Lindsey, M.D. [facility] Electronically Signed   By: Virginia Lindsey M.D.   On: 03/21/2017 12:25    Impression/Plan: Healing well from radiotherapy to the breast tissue.  Continue skin care with topical Vitamin E Oil and / or lotion for at least 2 more months for further healing.  I encouraged her to continue with yearly mammography as appropriate (for intact breast tissue) and followup with medical oncology. I will see her back on an as-needed basis. I have encouraged her to call if she has any issues or concerns in the future. I wished her the very best.  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 This gene mutation increases your risk to develop melanoma and kidney cancer.  Referral to [facility] due to above.  Route today's note to Virginia Lindsey, GYN per patient request  I spent 15 minutes face to face with the patient and more than 50% of that time was spent in counseling and/or coordination of care. _____________________________________   Virginia Gibson,  MD  This document serves as a record of services personally performed by Virginia Gibson, MD. It was created on her behalf by Rae Lips, a trained medical scribe. The creation of this record is based on the scribe's personal observations and the provider's statements to them. This document has been checked and approved by the attending provider.

## 2017-03-24 NOTE — Progress Notes (Signed)
Virginia Lindsey presents for follow up of radiation completed 02/20/17 to her Left Breast. She denies pain or fatigue. Her Left Breast has healed well and she continues to use radiaplex twice daily. She will switch to a vitamin E cream when the radiaplex tube is completed. She is taking Letrozole without difficulty. She will see survivorship 05/18/17.   BP (!) 156/94   Pulse 71   Temp 98.5 F (36.9 C)   Ht 5\' 4"  (1.626 m)   Wt 213 lb 9.6 oz (96.9 kg)   SpO2 99% Comment: room air  BMI 36.66 kg/m    Wt Readings from Last 3 Encounters:  03/24/17 213 lb 9.6 oz (96.9 kg)  02/20/17 213 lb 14.4 oz (97 kg)  12/28/16 208 lb 12.8 oz (94.7 kg)

## 2017-03-29 ENCOUNTER — Telehealth: Payer: Self-pay

## 2017-03-29 NOTE — Telephone Encounter (Signed)
Returned pt call to about her bone density test. Explained that pt t score was within normal range. Advised pt to continue eating foods rich in vitamin d and calcium. Told pt that will mail her a packet regarding her bone density result explanation and foods enriched with vitamin  D/calcium. Pt very thankful for the information. No further needs at this time.

## 2017-04-06 ENCOUNTER — Telehealth: Payer: Self-pay | Admitting: *Deleted

## 2017-04-06 NOTE — Telephone Encounter (Signed)
CALLED PATIENT TO INFORM OF APPT. WITH DR. Lovena Neighbours ON 04-10-17 @ - ARRIVAL TIME - 1:45 PM @ [facility], SPOKE WITH PATIENT AND SHE IS AWARE OF THIS APPT.

## 2017-04-10 ENCOUNTER — Other Ambulatory Visit (HOSPITAL_COMMUNITY): Payer: Self-pay | Admitting: Urology

## 2017-04-10 DIAGNOSIS — Z148 Genetic carrier of other disease: Secondary | ICD-10-CM

## 2017-04-19 ENCOUNTER — Ambulatory Visit (HOSPITAL_COMMUNITY)
Admission: RE | Admit: 2017-04-19 | Discharge: 2017-04-19 | Disposition: A | Discharge status: Home / Self Care | Payer: Medicare Other | Source: Ambulatory Visit | Attending: Urology | Admitting: Urology

## 2017-04-19 DIAGNOSIS — Z148 Genetic carrier of other disease: Secondary | ICD-10-CM | POA: Diagnosis present

## 2017-05-18 ENCOUNTER — Telehealth: Payer: Self-pay | Admitting: Medical Oncology

## 2017-05-18 ENCOUNTER — Inpatient Hospital Stay: Payer: Medicare Other | Attending: Genetic Counselor | Admitting: Adult Health

## 2017-05-18 ENCOUNTER — Encounter: Payer: Self-pay | Admitting: Adult Health

## 2017-05-18 ENCOUNTER — Telehealth: Payer: Self-pay | Admitting: Adult Health

## 2017-05-18 VITALS — BP 163/87 | HR 70 | Temp 98.4°F | Resp 18 | Ht 64.0 in | Wt 216.2 lb

## 2017-05-18 DIAGNOSIS — Z170 Estrogen receptor positive status [ER+]: Secondary | ICD-10-CM | POA: Diagnosis not present

## 2017-05-18 DIAGNOSIS — C50412 Malignant neoplasm of upper-outer quadrant of left female breast: Secondary | ICD-10-CM | POA: Diagnosis not present

## 2017-05-18 DIAGNOSIS — R21 Rash and other nonspecific skin eruption: Secondary | ICD-10-CM | POA: Insufficient documentation

## 2017-05-18 DIAGNOSIS — Z1501 Genetic susceptibility to malignant neoplasm of breast: Secondary | ICD-10-CM

## 2017-05-18 DIAGNOSIS — N951 Menopausal and female climacteric states: Secondary | ICD-10-CM | POA: Insufficient documentation

## 2017-05-18 NOTE — Telephone Encounter (Signed)
Pt asking if appt today is individual appt or with a group . I told her individual and she wants to bring her husband. I told her that is okay.

## 2017-05-18 NOTE — Patient Instructions (Signed)
Ribociclib tablets What is this medicine? RIBOCICLIB (rye boe SYE klib) is a medicine that targets proteins in cancer cells and stops the cancer cells from growing. It is used to treat breast cancer. This medicine may be used for other purposes; ask your health care provider or pharmacist if you have questions. COMMON BRAND NAME(S): KISQALI What should I tell my health care provider before I take this medicine? They need to know if you have any of these conditions: -heart disease -history of irregular heartbeat -infection (especially a virus infection such as chickenpox, cold sores, or herpes) -liver disease -low blood counts, like low white cell, platelet, or red cell counts -low levels of calcium, magnesium, potassium, or phosphorus in the blood -an unusual or allergic reaction to ribociclib, other medicines, foods, dyes, or preservatives -pregnant or trying to get pregnant -breast-feeding How should I use this medicine? Take this medicine by mouth with a glass of water. Follow the directions on the prescription label. Do not cut, crush or chew this medicine. Do not take with grapefruit juice, pomegranates, or pomegranate juice. You can take it with or without food. If it upsets your stomach, take it with food. Take your medicine at regular intervals. Do not take it more often than directed. Do not stop taking except on your doctor's advice. Talk to your pediatrician regarding the use of this medicine in children. Special care may be needed. Overdosage: If you think you have taken too much of this medicine contact a poison control center or emergency room at once. NOTE: This medicine is only for you. Do not share this medicine with others. What if I miss a dose? If you miss a dose or vomit after taking a dose, do not take another dose on that day. Take your next dose at your regular time. What may interact with this medicine? Do not take this medicine with any of the following  medications: -cisapride -dofetilide -dronedarone -pimozide -thioridazine -ziprasidone This medicine may interact with the following medications: -alfentanil -antiviral medicines for HIV or AIDS -boceprevir -certain medicines for fungal infections like ketoconazole, itraconazole, posaconazole, and voriconazole -certain medicines for seizures like carbamazepine, phenobarbital, phenytoin -clarithromycin -conivaptan -cyclosporine -ergot alkaloids like dihydroergotamine, ergonovine, ergotamine, methylergonovine -everolimus -fentanyl -grapefruit juice -midazolam -nefazodone -other medicines that prolong the QT interval (cause an abnormal heart rhythm) -pomegranate juice -quinidine -rifampin -sirolimus -St. John's Wort -tacrolimus This list may not describe all possible interactions. Give your health care provider a list of all the medicines, herbs, non-prescription drugs, or dietary supplements you use. Also tell them if you smoke, drink alcohol, or use illegal drugs. Some items may interact with your medicine. What should I watch for while using this medicine? Tell your doctor or healthcare professional if your symptoms do not start to get better or if they get worse. Do not take this medicine close to bedtime. It may prevent you from sleeping. You may need blood work done while you are taking this medicine. Do not become pregnant while taking this medicine or for 3 weeks after the last dose. Women should inform their doctor if they wish to become pregnant or think they might be pregnant. There is a potential for serious side effects to an unborn child. Men should inform their doctors if they wish to father a child. This medicine may lower sperm counts. Talk to your health care professional or pharmacist for more information. Do not breast-feed an infant while taking this medicine or for 3 weeks after the last dose. Avoid  taking products that contain aspirin, acetaminophen, ibuprofen,  naproxen, or ketoprofen unless instructed by your doctor. These medicines may hide a fever. Be careful brushing and flossing your teeth or using a toothpick because you may get an infection or bleed more easily. If you have any dental work done, tell your dentist you are receiving this medicine. Call your doctor or health care professional for advice if you get a fever, chills or sore throat, or other symptoms of a cold or flu. Do not treat yourself. This drug decreases your body's ability to fight infections. Try to avoid being around people who are sick. This medicine may increase your risk to bruise or bleed. Call your doctor or health care professional if you notice any unusual bleeding. What side effects may I notice from receiving this medicine? Side effects that you should report to your doctor or health care professional as soon as possible: -allergic reactions like skin rash, itching or hives, swelling of the face, lips, or tongue -signs of decreased platelets or bleeding - bruising, pinpoint red spots on the skin, black, tarry stools, blood in the urine -signs of decreased red blood cells - unusually weak or tired, feeling faint or lightheaded, falls -signs of infection - fever or chills, cough, sore throat, pain or difficulty passing urine -signs and symptoms of a dangerous change in heartbeat or heart rhythm like chest pain; dizziness; fast or irregular heartbeat; palpitations; feeling faint or lightheaded, falls; breathing problems -signs and symptoms of liver injury like dark yellow or brown urine; general ill feeling or flu-like symptoms; light-colored stools; loss of appetite; nausea; right upper belly pain; unusually weak or tired; yellowing of the eyes or skin -signs of low calcium like fast heartbeat, muscle cramps or muscle pain; pain, tingling, numbness in the hands or feet; seizures -signs and symptoms of low magnesium like muscle cramps, pain, or weakness; tremors; seizures; or  fast, irregular heartbeat -signs and symptoms of low potassium like muscle cramps or muscle pain; chest pain; dizziness; feeling faint or lightheaded, falls; palpitations; breathing problems; or fast, irregular heartbeat Side effects that usually do not require medical attention (report these to your doctor or health care professional if they continue or are bothersome): -constipation -diarrhea -hair loss -headache -loss of appetite -mouth sores -nausea/vomiting -red spots on the skin -sore throat -stomach pain -swelling of the ankles, feet, hands -trouble sleeping This list may not describe all possible side effects. Call your doctor for medical advice about side effects. You may report side effects to FDA at 1-800-FDA-1088. Where should I keep my medicine? Keep out of the reach of children. Store between 20 and 25 degrees C (68 and 77 degrees F). Throw away any unused medicine after the expiration date. NOTE: This sheet is a summary. It may not cover all possible information. If you have questions about this medicine, talk to your doctor, pharmacist, or health care provider.  2018 [employer] Standard (2015-03-18 14:58:05)

## 2017-05-18 NOTE — Telephone Encounter (Signed)
Gave patient AVS and calendar of upcoming august appointments

## 2017-05-18 NOTE — Progress Notes (Signed)
CLINIC:  Survivorship   REASON FOR VISIT:  Routine follow-up post-treatment for a recent history of breast cancer.  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 This gene mutation increases your risk to develop melanoma and kidney cancer.  There were no genetic changes in the following genes:   ALK, APC, ATM, AXIN2,BAP1,  BARD1, BLM, BMPR1A, BRCA1, BRCA2, BRIP1, CASR, CDC73, CDH1, CDK4, CDKN1B, CDKN1C, CDKN2A (p14ARF), CDKN2A (p16INK4a), CEBPA, CHEK2, CTNNA1, DICER1, DIS3L2, EGFR (c.2369C>T, p.Thr790Met variant only), EPCAM (Deletion/duplication testing only), FH, FLCN, GATA2, GPC3, GREM1 (Promoter region deletion/duplication testing only), HOXB13 (c.251G>A, p.Gly84Glu), HRAS, KIT, MAX, MEN1, MET, MLH1, MSH2, MSH3, MSH6, MUTYH, NBN, NF1, NF2, NTHL1, PALB2, PDGFRA, PHOX2B, PMS2, POLD1, POLE, POT1, PRKAR1A, PTCH1, PTEN, RAD50, RAD51C, RAD51D, RB1, RECQL4, RET, RUNX1, SDHAF2, SDHA (sequence changes only), SDHB, SDHC, SDHD, SMAD4, SMARCA4, SMARCB1, SMARCE1, STK11, SUFU, TERT, TERT, TMEM127, TP53, TSC1, TSC2, VHL, WRN and WT1.            03/2017 -  Anti-estrogen oral therapy    Letrozole daily       INTERVAL HISTORY:  Virginia Lindsey presents to the [facility] today for our initial meeting to review her survivorship care plan detailing her treatment course for breast cancer, as well as monitoring long-term side effects of that treatment, education regarding health maintenance, screening, and overall wellness and health promotion.      Overall, Virginia Lindsey reports feeling quite well.  She is taking Letrozole daily and is having tolerable hot flashes.  She denies any other issues today.  She has occasional left axillary discomfort and an occasional pink tinge to her left breast.  She also notes a new rash with redness noted on her upper chest wall, and some splotches on her right upper chest.  This has been there for a week.  She denies any new lotions, creams, detergent, medications, supplements, or foods.    REVIEW OF SYSTEMS:  Review of Systems  Constitutional: Positive for fatigue. Negative for appetite change, chills and unexpected weight change.  HENT:   Negative for hearing loss, lump/mass and trouble swallowing.   Eyes: Negative for eye problems and icterus.  Respiratory: Negative for chest tightness, cough and shortness of breath.   Gastrointestinal: Negative for abdominal distention, abdominal pain, constipation, diarrhea, nausea and vomiting.  Endocrine: Positive for hot flashes.  Genitourinary: Negative for difficulty urinating and dyspareunia.   Skin: Positive for rash.  Neurological: Negative for dizziness, extremity weakness and headaches.  Hematological: Negative for adenopathy. Does not bruise/bleed easily.  Breast: Denies any new nodularity, masses, tenderness, nipple changes, or nipple discharge.      ONCOLOGY TREATMENT TEAM:  1. Surgeon:  Dr. Donne Hazel at [facility] 2. Medical Oncologist: Dr. Lindi Adie  3. Radiation Oncologist: Dr. Isidore Moos    PAST MEDICAL/SURGICAL HISTORY:  Past Medical History:  Diagnosis Date  . Back pain   . Breast cancer ([place])   . Family history of breast cancer   .  Family history of melanoma   . Family history of renal cancer   . PONV (postoperative nausea and vomiting)   . Seizures ([place])    last seizure 2004  3 total 10 yrs apart  . Wears glasses    Past Surgical History:  Procedure Laterality Date  . ABDOMINAL HYSTERECTOMY  1991   partial  hysterectomy  . BREAST BIOPSY Left 10/29/2012   Procedure:  LEFT BREAST WIRE GUIDED EXCISION;  Surgeon: Rolm Bookbinder, MD;  Location: [place] [facility];  Service: General;  Laterality: Left;  . BREAST EXCISIONAL BIOPSY    . BREAST LUMPECTOMY WITH RADIOACTIVE SEED AND SENTINEL LYMPH NODE BIOPSY Left 12/19/2016   Procedure: LEFT BREAST LUMPECTOMY WITH RADIOACTIVE SEED AND LEFT AXILLARY SENTINEL LYMPH NODE BIOPSY;  Surgeon: Rolm Bookbinder, MD;  Location: [facility] [place];  Service: General;  Laterality: Left;  . BREAST SURGERY  2013   biopsy-lt  . CHOLECYSTECTOMY    . COLONOSCOPY    . LUMBAR LAMINECTOMY/DECOMPRESSION MICRODISCECTOMY Right 08/27/2012   Procedure: RIGHT  LUMBAR FOUR FIVE LAMINECTOMY/DECOMPRESSION MICRODISCECTOMY 1 LEVEL;  Surgeon: Elaina Hoops, MD;  Location: [place] [facility];  Service: Neurosurgery;  Laterality: Right;  . UPPER GI ENDOSCOPY     multiple endoscopy's     ALLERGIES:  Allergies  Allergen Reactions  . Codeine Nausea And Vomiting  . Hydrocodone Nausea And Vomiting    Also lightheaded and disoriented  . Latex Rash     CURRENT MEDICATIONS:  Outpatient Encounter Medications as of 05/18/2017  Medication Sig  . acetaminophen (TYLENOL) 325 MG tablet Take 650 mg by mouth every 6 (six) hours as needed.  Marland Kitchen letrozole (FEMARA) 2.5 MG tablet Take 1 tablet (2.5 mg total) by mouth daily.   No facility-administered encounter medications on file as of 05/18/2017.      ONCOLOGIC FAMILY HISTORY:  Family History  Problem Relation Age of Onset  . Asthma Mother   . Cancer Mother        renal cell carcinoma, dx in her 40s, d. 47  . Cancer Father 14       melanoma on face; d. 30  . Diabetes Maternal Grandmother   . Breast cancer Sister 51       d. 68  . Breast cancer Paternal Aunt 48       dbl mastectomy  . Cancer Paternal Uncle        NOS  . Melanoma Paternal Aunt   . Melanoma Cousin        pat first cousin; daughter of aunt with melanoma  .  Lymphoma Son 4       [place] Lymphoma     GENETIC COUNSELING/TESTING: Yes.  See below.    SOCIAL HISTORY:  Social History   Socioeconomic History  . Marital status: Married    Spouse name: Not on file  . Number of children: 4  . Years of education: Not on file  . Highest education level: Not on file  Occupational History  . Not on file  Social Needs  . Financial resource strain: Not on file  . Food insecurity:    Worry: Not on file    Inability: Not on file  . Transportation needs:    Medical: Not on file    Non-medical: Not on file  Tobacco Use  . Smoking status: Never Smoker  . Smokeless tobacco: Never Used  Substance and Sexual Activity  . Alcohol use: No  . Drug use: No  . Sexual activity: Not  on file  Lifestyle  . Physical activity:    Days per week: Not on file    Minutes per session: Not on file  . Stress: Not on file  Relationships  . Social connections:    Talks on phone: Not on file    Gets together: Not on file    Attends religious service: Not on file    Active member of club or organization: Not on file    Attends meetings of clubs or organizations: Not on file    Relationship status: Not on file  . Intimate partner violence:    Fear of current or ex partner: Not on file    Emotionally abused: Not on file    Physically abused: Not on file    Forced sexual activity: Not on file  Other Topics Concern  . Not on file  Social History Narrative  . Not on file      PHYSICAL EXAMINATION:  Vital Signs:   Vitals:   05/18/17 0928  BP: (!) 163/87  Pulse: 70  Resp: 18  Temp: 98.4 F (36.9 C)  SpO2: 100%   Filed Weights   05/18/17 0928  Weight: 216 lb 3.2 oz (98.1 kg)   General: Well-nourished, well-appearing female in no acute distress.  She is accompanied in clinic by her husband Jeneen Rinks today.   HEENT: Head is normocephalic.  Pupils equal and reactive to light. Conjunctivae clear without exudate.  Sclerae anicteric. Oral mucosa is pink,  moist.  Oropharynx is pink without lesions or erythema.  Lymph: No cervical, supraclavicular, or infraclavicular lymphadenopathy noted on palpation.  Cardiovascular: Regular rate and rhythm.Marland Kitchen Respiratory: Clear to auscultation bilaterally. Chest expansion symmetric; breathing non-labored.  Breast: left breast s/p lumpectomy, no nodules, masses noted, slight pink hue noted above areola, no warmth or swelling noted, right breast without nodules, masses, skin or nipple changes GI: Abdomen soft and round; non-tender, non-distended. Bowel sounds normoactive.  GU: Deferred.  Neuro: No focal deficits. Steady gait.  Psych: Mood and affect normal and appropriate for situation.  Extremities: No edema. MSK: No focal spinal tenderness to palpation.  Full range of motion in bilateral upper extremities Skin: Warm and dry. Erythema and macular scattered small patch like areas on inner breast, very fine.    LABORATORY DATA:  None for this visit.  DIAGNOSTIC IMAGING:  None for this visit.    ASSESSMENT AND PLAN:  Ms.. Lindsey is a pleasant 67 y.o. female with Stage IA left breast invasive ductal carcinoma, ER+/PR+/HER2-, diagnosed in 11/2016, treated with lumpectomy, adjuvant radiation therapy, and anti-estrogen therapy with Letrozole beginning in 03/2017.  She presents to the Survivorship Clinic for our initial meeting and routine follow-up post-completion of treatment for breast cancer.    1. Stage IA left breast cancer:  Virginia Lindsey is continuing to recover from definitive treatment for breast cancer. She will follow-up with her medical oncologist, Dr. Lindi Adie in 3 months with history and physical exam per surveillance protocol.  She will continue her anti-estrogen therapy with Letrozole. Thus far, she is tolerating the Letrozole well, with minimal side effects. She may be a candidate for the [facility] clinical trial based on our clinical trial brochure.  I talked to her briefly about this, and that she can see  Dr. Lindi Adie in 3 months to determine whether or not she is a candidate and meets criteria.  She requested information which I gave her today.  Today, a comprehensive survivorship care plan and treatment summary was reviewed with the patient  today detailing her breast cancer diagnosis, treatment course, potential late/long-term effects of treatment, appropriate follow-up care with recommendations for the future, and patient education resources.  A copy of this summary, along with a letter will be sent to the patient's primary care provider via mail/fax/In Basket message after today's visit.    2. MITF mutation: I reviewed the genetic counselor note with Virginia Lindsey, which indicates an increased risk to develop melanoma and kidney cancer.  Recommendations were reviewed with her, including good sun care, follow up with dermatology and urology regularly, and any imaging including modalities of these specialties will be at their discretion.  I explained to her that this mutation is different than a BRCA mutation, and that with this mutation it doesn't indicate a need for annual PET scans, or MRI breasts instead of mammograms.  However, should there be another reason a PET, or MRI breast should be considered, that will be at Dr. Geralyn Flash discretion.  3. Rash: I recommended hydrocortisone cream and monitoring.  May need to see PCP or dermatology if it continues or worsens.    4. Bone health:  Given Virginia Lindsey's age/history of breast cancer and her current treatment regimen including anti-estrogen therapy with Letrozole, she is at risk for bone demineralization.  Her last DEXA scan was 03/21/2017 and was normal.   I counseled her that she will need one every 2 years while on an aromatase inhibitor.  In the meantime, she was encouraged to increase her consumption of foods rich in calcium, as well as increase her weight-bearing activities.  She was given education on specific activities to promote bone health.  5. Cancer  screening:  Due to Virginia Lindsey's history and her age, she should receive screening for skin cancers, colon cancer, and gynecologic cancers.  The information and recommendations are listed on the patient's comprehensive care plan/treatment summary and were reviewed in detail with the patient.    6. Health maintenance and wellness promotion: Virginia Lindsey was encouraged to consume 5-7 servings of fruits and vegetables per day. We reviewed the "Nutrition Rainbow" handout, as well as the handout "Take Control of Your Health and Reduce Your Cancer Risk" from the [place].  She was also encouraged to engage in moderate to vigorous exercise for 30 minutes per day most days of the week. We discussed the [employer] fitness program, which is designed for cancer survivors to help them become more physically fit after cancer treatments.  She was instructed to limit her alcohol consumption and continue to abstain from tobacco use.     7. Support services/counseling: It is not uncommon for this period of the patient's cancer care trajectory to be one of many emotions and stressors.  We discussed an opportunity for her to participate in the next session of [facility] ("Finding Your New Normal") support group series designed for patients after they have completed treatment.   Virginia Lindsey was encouraged to take advantage of our many other support services programs, support groups, and/or counseling in coping with her new life as a cancer survivor after completing anti-cancer treatment.  She was offered support today through active listening and expressive supportive counseling.  She was given information regarding our available services and encouraged to contact me with any questions or for help enrolling in any of our support group/programs.    Dispo:   -Return to cancer center in 3 months for follow up with Dr. Lindi Adie -Mammogram due in 11/2017 -Bone Density 03/2019 -Follow up with Dr. Donne Hazel in  about 6  months -She is welcome to return back to the Survivorship Clinic at any time; no additional follow-up needed at this time.  -Consider referral back to survivorship as a long-term survivor for continued surveillance  A total of (30) minutes of face-to-face time was spent with this patient with greater than 50% of that time in counseling and care-coordination.   Gardenia Phlegm, NP Survivorship Program [place] [phone]   Note: PRIMARY CARE PROVIDER Antony Contras, [place] [phone]

## 2017-08-24 ENCOUNTER — Inpatient Hospital Stay: Payer: Medicare Other | Attending: Hematology and Oncology | Admitting: Hematology and Oncology

## 2017-08-24 ENCOUNTER — Telehealth: Payer: Self-pay | Admitting: Hematology and Oncology

## 2017-08-24 DIAGNOSIS — C50412 Malignant neoplasm of upper-outer quadrant of left female breast: Secondary | ICD-10-CM | POA: Diagnosis present

## 2017-08-24 DIAGNOSIS — Z170 Estrogen receptor positive status [ER+]: Secondary | ICD-10-CM

## 2017-08-24 MED ORDER — LETROZOLE 2.5 MG PO TABS: 2.5000 mg | ORAL_TABLET | Freq: Every day | ORAL | 3 refills | Status: DC

## 2017-08-24 NOTE — Telephone Encounter (Signed)
Gave avs and calendar ° °

## 2017-08-24 NOTE — Progress Notes (Signed)
Patient Care Team: Antony Contras, MD as PCP - General (Family Medicine) Molli Posey, MD as Consulting Physician (Obstetrics and Gynecology) Nicholas Lose, MD as Consulting Physician (Hematology and Oncology) Eppie Gibson, MD as Attending Physician (Radiation Oncology) Rolm Bookbinder, MD as Consulting Physician (General Surgery)  DIAGNOSIS:  Encounter Diagnosis  Name Primary?  . 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 This gene mutation increases your risk to develop melanoma and kidney cancer.  There were no genetic changes in the following genes:   ALK, APC, ATM, AXIN2,BAP1,  BARD1, BLM, BMPR1A, BRCA1, BRCA2, BRIP1, CASR, CDC73, CDH1, CDK4, CDKN1B, CDKN1C, CDKN2A (p14ARF), CDKN2A (p16INK4a), CEBPA, CHEK2, CTNNA1, DICER1, DIS3L2, EGFR (c.2369C>T, p.Thr790Met variant only), EPCAM (Deletion/duplication testing only), FH, FLCN, GATA2, GPC3, GREM1 (Promoter region deletion/duplication testing only), HOXB13 (c.251G>A, p.Gly84Glu), HRAS, KIT, MAX, MEN1, MET, MLH1, MSH2, MSH3, MSH6, MUTYH, NBN, NF1, NF2, NTHL1, PALB2, PDGFRA, PHOX2B, PMS2, POLD1, POLE, POT1, PRKAR1A, PTCH1, PTEN, RAD50, RAD51C, RAD51D, RB1, RECQL4, RET, RUNX1, SDHAF2, SDHA (sequence changes only), SDHB, SDHC, SDHD, SMAD4, SMARCA4, SMARCB1, SMARCE1, STK11, SUFU, TERT, TERT, TMEM127, TP53, TSC1, TSC2, VHL, WRN and WT1.          03/2017 -  Anti-estrogen oral  therapy    Letrozole daily     CHIEF COMPLIANT: Follow-up on letrozole therapy  INTERVAL HISTORY: HILJA KINTZEL is a 67 year old with above-mentioned left breast cancer treated with lumpectomy radiation is currently letrozole therapy.  She appears to be tolerating extremely well.  She has very occasional hot flashes initially but those went away.  She does have back stiffness which happens after she sits down for a while.  She does not attribute this to antiestrogen therapy.  She has had chronic back issues before.  She denies any lumps or nodules in the breast.  Occasionally when she leans forward it feels like the left breast appears to be in the way and does not seem to be as mobile as the right breast.  REVIEW OF SYSTEMS:   Constitutional: Denies fevers, chills or abnormal weight loss Eyes: Denies blurriness of vision Ears, nose, mouth, throat, and face: Denies mucositis or sore throat Respiratory: Denies cough, dyspnea or wheezes Cardiovascular: Denies palpitation, chest discomfort Gastrointestinal:  Denies nausea, heartburn or change in bowel habits Skin: Denies abnormal skin rashes Lymphatics: Denies new lymphadenopathy or easy bruising Neurological:Denies numbness, tingling or new weaknesses Behavioral/Psych: Mood is stable, no new changes  Extremities: No lower extremity edema Breast:  denies any pain or lumps or nodules in either breasts All other systems were reviewed with the patient and are negative.  I have reviewed the past medical history, past surgical history, social history and family history with the patient and they are unchanged from previous note.  ALLERGIES:  is allergic to codeine; hydrocodone; and latex.  MEDICATIONS:  Current Outpatient Medications  Medication Sig Dispense Refill  . acetaminophen (  TYLENOL) 325 MG tablet Take 650 mg by mouth every 6 (six) hours as needed.    Marland Kitchen letrozole (FEMARA) 2.5 MG tablet Take 1 tablet (2.5 mg total) by mouth daily.  90 tablet 3   No current facility-administered medications for this visit.     PHYSICAL EXAMINATION: ECOG PERFORMANCE STATUS: 1 - Symptomatic but completely ambulatory  Vitals:   08/24/17 0903  BP: (!) 158/80  Pulse: 70  Resp: 18  Temp: 98.6 F (37 C)  SpO2: 98%   Filed Weights   08/24/17 0903  Weight: 218 lb 14.4 oz (99.3 kg)    GENERAL:alert, no distress and comfortable SKIN: skin color, texture, turgor are normal, no rashes or significant lesions EYES: normal, Conjunctiva are pink and non-injected, sclera clear OROPHARYNX:no exudate, no erythema and lips, buccal mucosa, and tongue normal  NECK: supple, thyroid normal size, non-tender, without nodularity LYMPH:  no palpable lymphadenopathy in the cervical, axillary or inguinal LUNGS: clear to auscultation and percussion with normal breathing effort HEART: regular rate & rhythm and no murmurs and no lower extremity edema ABDOMEN:abdomen soft, non-tender and normal bowel sounds MUSCULOSKELETAL:no cyanosis of digits and no clubbing  NEURO: alert & oriented x 3 with fluent speech, no focal motor/sensory deficits EXTREMITIES: No lower extremity edema BREAST: No palpable masses or nodules in either right or left breasts.  Some tenderness to palpation of the right breast no palpable axillary supraclavicular or infraclavicular adenopathy no breast tenderness or nipple discharge. (exam performed in the presence of a chaperone)  LABORATORY DATA:  I have reviewed the data as listed CMP Latest Ref Rng & Units 08/23/2012  Glucose 70 - 99 mg/dL 91  BUN 6 - 23 mg/dL 12  Creatinine 0.50 - 1.10 mg/dL 0.80  Sodium 135 - 145 mEq/L 142  Potassium 3.5 - 5.1 mEq/L 3.5  Chloride 96 - 112 mEq/L 105  CO2 19 - 32 mEq/L 27  Calcium 8.4 - 10.5 mg/dL 9.4    Lab Results  Component Value Date   WBC 8.8 08/23/2012   HGB 13.7 08/23/2012   HCT 39.1 08/23/2012   MCV 90.3 08/23/2012   PLT 320 08/23/2012    ASSESSMENT & PLAN:  Malignant  neoplasm of upper-outer quadrant of left breast in female, estrogen receptor positive ([facility]) 12/19/16:Left Lumpectomy: Grade 1 IDC, 0.6 cm, Er 100%, PR 100%, Her 2 Neg, Ki 67: 3%, T1bN0 (stage 1A) Adjuvant radiation therapy 01/24/2017-02/20/2017  Treatment plan: Adjuvant antiestrogen therapy with letrozole 2.5 mg daily to start 03/03/2017 Letrozole toxicities:  Breast cancer surveillance: 1.  Mammograms will be done in November. 2. breast exam completed in May 2019  Return to clinic in 1 year for follow-up      No orders of the defined types were placed in this encounter.  The patient has a good understanding of the overall plan. she agrees with it. she will call with any problems that may develop before the next visit here.   Harriette Ohara, MD 08/24/17

## 2017-08-24 NOTE — Assessment & Plan Note (Signed)
12/19/16:Left Lumpectomy: Grade 1 IDC, 0.6 cm, Er 100%, PR 100%, Her 2 Neg, Ki 67: 3%, T1bN0 (stage 1A) Adjuvant radiation therapy 01/24/2017-02/20/2017  Treatment plan: Adjuvant antiestrogen therapy with letrozole 2.5 mg daily to start 03/03/2017 Letrozole toxicities:  Breast cancer surveillance: 1.  Mammograms will be done in November. 2. breast exam completed in May 2019  Return to clinic in 1 year for follow-up

## 2017-09-06 ENCOUNTER — Ambulatory Visit
Admission: RE | Admit: 2017-09-06 | Discharge: 2017-09-06 | Disposition: A | Discharge status: Home / Self Care | Payer: Medicare Other | Source: Ambulatory Visit | Attending: General Surgery | Admitting: General Surgery

## 2017-09-06 ENCOUNTER — Other Ambulatory Visit: Payer: Self-pay | Admitting: General Surgery

## 2017-09-06 DIAGNOSIS — N611 Abscess of the breast and nipple: Secondary | ICD-10-CM

## 2017-09-13 LAB — AEROBIC/ANAEROBIC CULTURE (SURGICAL/DEEP WOUND): SPECIAL REQUESTS: NORMAL

## 2017-09-13 LAB — SUSCEPTIBILITY, AER + ANAEROB

## 2017-09-13 LAB — AEROBIC/ANAEROBIC CULTURE W GRAM STAIN (SURGICAL/DEEP WOUND)

## 2017-09-13 LAB — SUSCEPTIBILITY RESULT

## 2017-09-18 ENCOUNTER — Other Ambulatory Visit: Payer: Self-pay | Admitting: General Surgery

## 2017-09-18 DIAGNOSIS — N611 Abscess of the breast and nipple: Secondary | ICD-10-CM

## 2017-09-25 ENCOUNTER — Ambulatory Visit
Admission: RE | Admit: 2017-09-25 | Discharge: 2017-09-25 | Disposition: A | Discharge status: Home / Self Care | Payer: Medicare Other | Source: Ambulatory Visit | Attending: General Surgery | Admitting: General Surgery

## 2017-09-25 DIAGNOSIS — N611 Abscess of the breast and nipple: Secondary | ICD-10-CM

## 2017-10-25 ENCOUNTER — Other Ambulatory Visit: Payer: Self-pay | Admitting: Hematology and Oncology

## 2017-10-25 ENCOUNTER — Other Ambulatory Visit: Payer: Self-pay | Admitting: General Surgery

## 2017-10-25 DIAGNOSIS — Z853 Personal history of malignant neoplasm of breast: Secondary | ICD-10-CM

## 2017-11-23 ENCOUNTER — Ambulatory Visit
Admission: RE | Admit: 2017-11-23 | Discharge: 2017-11-23 | Disposition: A | Discharge status: Home / Self Care | Payer: Medicare Other | Source: Ambulatory Visit | Attending: Hematology and Oncology | Admitting: Hematology and Oncology

## 2017-11-23 DIAGNOSIS — Z853 Personal history of malignant neoplasm of breast: Secondary | ICD-10-CM

## 2018-01-03 DIAGNOSIS — U071 COVID-19: Secondary | ICD-10-CM

## 2018-01-03 HISTORY — DX: COVID-19: U07.1

## 2018-02-28 ENCOUNTER — Other Ambulatory Visit: Payer: Self-pay | Admitting: Urology

## 2018-02-28 DIAGNOSIS — Z148 Genetic carrier of other disease: Secondary | ICD-10-CM

## 2018-04-12 ENCOUNTER — Other Ambulatory Visit: Payer: Medicare Other

## 2018-05-21 ENCOUNTER — Other Ambulatory Visit: Payer: Medicare Other

## 2018-05-30 ENCOUNTER — Ambulatory Visit
Admission: RE | Admit: 2018-05-30 | Discharge: 2018-05-30 | Disposition: A | Discharge status: Home / Self Care | Payer: Medicare Other | Source: Ambulatory Visit | Attending: Urology | Admitting: Urology

## 2018-05-30 DIAGNOSIS — Z148 Genetic carrier of other disease: Secondary | ICD-10-CM

## 2018-06-04 ENCOUNTER — Other Ambulatory Visit: Payer: Medicare Other

## 2018-08-20 NOTE — Assessment & Plan Note (Signed)
12/19/16:Left Lumpectomy: Grade 1 IDC, 0.6 cm, Er 100%, PR 100%, Her 2 Neg, Ki 67: 3%, T1bN0 (stage 1A) Adjuvant radiation therapy 01/24/2017-02/20/2017  Treatment plan: Adjuvant antiestrogen therapy with letrozole 2.5 mg daily to start 03/03/2017 Letrozole toxicities: Denies any major adverse effects to letrozole therapy.  Breast cancer surveillance: 1.  Mammograms  November 2019: Benign breast density category B 2. breast exam completed in May 2019  Return to clinic in 1 year for follow-up

## 2018-08-22 ENCOUNTER — Other Ambulatory Visit: Payer: Self-pay | Admitting: Hematology and Oncology

## 2018-08-26 NOTE — Progress Notes (Signed)
Patient Care Team: Antony Contras, MD as PCP - General (Family Medicine) Molli Posey, MD as Consulting Physician (Obstetrics and Gynecology) Nicholas Lose, MD as Consulting Physician (Hematology and Oncology) Eppie Gibson, MD as Attending Physician (Radiation Oncology) Rolm Bookbinder, MD as Consulting Physician (General Surgery)  DIAGNOSIS:    ICD-10-CM   1. 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 This gene mutation increases your risk to develop melanoma and kidney cancer.  There were no genetic changes in the following genes:   ALK, APC, ATM, AXIN2,BAP1,  BARD1, BLM, BMPR1A, BRCA1, BRCA2, BRIP1, CASR, CDC73, CDH1, CDK4, CDKN1B, CDKN1C, CDKN2A (p14ARF), CDKN2A (p16INK4a), CEBPA, CHEK2, CTNNA1, DICER1, DIS3L2, EGFR (c.2369C>T, p.Thr790Met variant only), EPCAM (Deletion/duplication testing only), FH, FLCN, GATA2, GPC3, GREM1 (Promoter region deletion/duplication testing only), HOXB13 (c.251G>A, p.Gly84Glu), HRAS, KIT, MAX, MEN1, MET, MLH1, MSH2, MSH3, MSH6, MUTYH, NBN, NF1, NF2, NTHL1, PALB2, PDGFRA, PHOX2B, PMS2, POLD1, POLE, POT1, PRKAR1A, PTCH1, PTEN, RAD50, RAD51C, RAD51D, RB1, RECQL4, RET, RUNX1, SDHAF2, SDHA (sequence changes only), SDHB, SDHC, SDHD, SMAD4, SMARCA4, SMARCB1, SMARCE1, STK11, SUFU, TERT, TERT, TMEM127, TP53, TSC1, TSC2, VHL, WRN and WT1.         03/2017 -  Anti-estrogen  oral therapy   Letrozole daily     CHIEF COMPLIANT: Follow-up of left breast cancer on letrozole therapy  INTERVAL HISTORY: Virginia Lindsey is a 68 y.o. with above-mentioned history of left breast cancer treated with lumpectomy, radiation, and who is currently on letrozole therapy. I last saw her a year ago. Mammogram on 11/23/2017 showed no evidence of malignancy. She presents to the clinic today for annual follow-up.  Patient reports no new symptoms or concerns.  She feels occasionally hot.  She has not been exercising.  She has gained some weight.  She blames her husband for not going on walks.  REVIEW OF SYSTEMS:   Constitutional: Denies fevers, chills or abnormal weight loss Eyes: Denies blurriness of vision Ears, nose, mouth, throat, and face: Denies mucositis or sore throat Respiratory: Denies cough, dyspnea or wheezes Cardiovascular: Denies palpitation, chest discomfort Gastrointestinal: Denies nausea, heartburn or change in bowel habits Skin: Denies abnormal skin rashes Lymphatics: Denies new lymphadenopathy or easy bruising Neurological: Denies numbness, tingling or new weaknesses Behavioral/Psych: Mood is stable, no new changes  Extremities: No lower extremity edema Breast: denies any pain or lumps or nodules in either breasts All other systems were reviewed with the patient and are negative.  I have reviewed the past medical history, past surgical history, social history and family history with the patient and they are unchanged from previous note.  ALLERGIES:  is allergic to codeine; hydrocodone; and latex.  MEDICATIONS:  Current Outpatient Medications  Medication Sig Dispense Refill  . acetaminophen (TYLENOL) 325 MG tablet Take 650 mg by mouth every 6 (six) hours as needed.    Marland Kitchen letrozole (FEMARA) 2.5 MG tablet TAKE 1 TABLET BY  MOUTH EVERY DAY 90 tablet 3   No current facility-administered medications for this visit.     PHYSICAL EXAMINATION: ECOG PERFORMANCE  STATUS: 1 - Symptomatic but completely ambulatory  Vitals:   08/27/18 0817  BP: (!) 150/81  Pulse: 72  Resp: 17  Temp: 98.7 F (37.1 C)  SpO2: 99%   Filed Weights   08/27/18 0817  Weight: 220 lb 8 oz (100 kg)    GENERAL: alert, no distress and comfortable SKIN: skin color, texture, turgor are normal, no rashes or significant lesions EYES: normal, Conjunctiva are pink and non-injected, sclera clear OROPHARYNX: no exudate, no erythema and lips, buccal mucosa, and tongue normal  NECK: supple, thyroid normal size, non-tender, without nodularity LYMPH: no palpable lymphadenopathy in the cervical, axillary or inguinal LUNGS: clear to auscultation and percussion with normal breathing effort HEART: regular rate & rhythm and no murmurs and no lower extremity edema ABDOMEN: abdomen soft, non-tender and normal bowel sounds MUSCULOSKELETAL: no cyanosis of digits and no clubbing  NEURO: alert & oriented x 3 with fluent speech, no focal motor/sensory deficits EXTREMITIES: No lower extremity edema BREAST: No palpable masses or nodules in either right or left breasts.  There is significant tenderness in the left breast especially around the nipple area Virginia Lindsey as well as in the left axilla.  No palpable axillary supraclavicular or infraclavicular adenopathy no breast tenderness or nipple discharge. (exam performed in the presence of a chaperone)  LABORATORY DATA:  I have reviewed the data as listed CMP Latest Ref Rng & Units 08/23/2012  Glucose 70 - 99 mg/dL 91  BUN 6 - 23 mg/dL 12  Creatinine 0.50 - 1.10 mg/dL 0.80  Sodium 135 - 145 mEq/L 142  Potassium 3.5 - 5.1 mEq/L 3.5  Chloride 96 - 112 mEq/L 105  CO2 19 - 32 mEq/L 27  Calcium 8.4 - 10.5 mg/dL 9.4    Lab Results  Component Value Date   WBC 8.8 08/23/2012   HGB 13.7 08/23/2012   HCT 39.1 08/23/2012   MCV 90.3 08/23/2012   PLT 320 08/23/2012    ASSESSMENT & PLAN:  Malignant neoplasm of upper-outer quadrant of left breast in  female, estrogen receptor positive ([facility]) 12/19/16:Left Lumpectomy: Grade 1 IDC, 0.6 cm, Er 100%, PR 100%, Her 2 Neg, Ki 67: 3%, T1bN0 (stage 1A) Adjuvant radiation therapy 01/24/2017-02/20/2017  Treatment plan: Adjuvant antiestrogen therapy with letrozole 2.5 mg daily to start 03/03/2017 Letrozole toxicities: Denies any major adverse effects to letrozole therapy. Breast tenderness: No palpable lumps or nodules.  Breast cancer surveillance: 1.  Mammograms  November 2019: Benign breast density category B 2. breast exam c 08/27/2018: Benign, tenderness in the left breast  Return to clinic in 1 year for follow-up    No orders of the defined types were placed in this encounter.  The patient has a good understanding of the overall plan. she agrees with it. she will call with any problems that may develop before the next visit here.  Nicholas Lose, MD 08/27/2018  Julious Oka Dorshimer am acting as scribe for Dr. Nicholas Lose.  I have reviewed the above documentation for accuracy and completeness, and I agree with the above.

## 2018-08-27 ENCOUNTER — Inpatient Hospital Stay: Payer: Medicare Other | Attending: Hematology and Oncology | Admitting: Hematology and Oncology

## 2018-08-27 ENCOUNTER — Other Ambulatory Visit: Payer: Self-pay

## 2018-08-27 DIAGNOSIS — Z79811 Long term (current) use of aromatase inhibitors: Secondary | ICD-10-CM | POA: Insufficient documentation

## 2018-08-27 DIAGNOSIS — C50412 Malignant neoplasm of upper-outer quadrant of left female breast: Secondary | ICD-10-CM | POA: Insufficient documentation

## 2018-08-27 DIAGNOSIS — Z923 Personal history of irradiation: Secondary | ICD-10-CM | POA: Insufficient documentation

## 2018-08-27 DIAGNOSIS — Z170 Estrogen receptor positive status [ER+]: Secondary | ICD-10-CM | POA: Insufficient documentation

## 2018-08-28 ENCOUNTER — Telehealth: Payer: Self-pay | Admitting: Hematology and Oncology

## 2018-08-28 NOTE — Telephone Encounter (Signed)
I talk with patient regarding schedule  

## 2018-10-10 ENCOUNTER — Other Ambulatory Visit: Payer: Self-pay | Admitting: Hematology and Oncology

## 2018-10-10 DIAGNOSIS — Z853 Personal history of malignant neoplasm of breast: Secondary | ICD-10-CM

## 2018-11-26 ENCOUNTER — Other Ambulatory Visit: Payer: Self-pay

## 2018-11-26 ENCOUNTER — Ambulatory Visit
Admission: RE | Admit: 2018-11-26 | Discharge: 2018-11-26 | Disposition: A | Discharge status: Home / Self Care | Payer: Medicare Other | Source: Ambulatory Visit | Attending: Hematology and Oncology | Admitting: Hematology and Oncology

## 2018-11-26 DIAGNOSIS — Z853 Personal history of malignant neoplasm of breast: Secondary | ICD-10-CM

## 2019-03-22 ENCOUNTER — Other Ambulatory Visit: Payer: Self-pay | Admitting: Family Medicine

## 2019-03-22 ENCOUNTER — Ambulatory Visit
Admission: RE | Admit: 2019-03-22 | Discharge: 2019-03-22 | Disposition: A | Discharge status: Home / Self Care | Payer: Medicare Other | Source: Ambulatory Visit | Attending: Family Medicine | Admitting: Family Medicine

## 2019-03-22 DIAGNOSIS — M25552 Pain in left hip: Secondary | ICD-10-CM

## 2019-05-07 DIAGNOSIS — M654 Radial styloid tenosynovitis [de Quervain]: Secondary | ICD-10-CM | POA: Diagnosis not present

## 2019-05-07 DIAGNOSIS — M25531 Pain in right wrist: Secondary | ICD-10-CM | POA: Diagnosis not present

## 2019-06-27 DIAGNOSIS — E559 Vitamin D deficiency, unspecified: Secondary | ICD-10-CM | POA: Diagnosis not present

## 2019-08-26 NOTE — Progress Notes (Signed)
Patient Care Team: Antony Contras, MD as PCP - General (Family Medicine) Molli Posey, MD as Consulting Physician (Obstetrics and Gynecology) Nicholas Lose, MD as Consulting Physician (Hematology and Oncology) Eppie Gibson, MD as Attending Physician (Radiation Oncology) Rolm Bookbinder, MD as Consulting Physician (General Surgery)  DIAGNOSIS:    ICD-10-CM   1. 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 This gene mutation increases your risk to develop melanoma and kidney cancer.  There were no genetic changes in the following genes:   ALK, APC, ATM, AXIN2,BAP1,  BARD1, BLM, BMPR1A, BRCA1, BRCA2, BRIP1, CASR, CDC73, CDH1, CDK4, CDKN1B, CDKN1C, CDKN2A (p14ARF), CDKN2A (p16INK4a), CEBPA, CHEK2, CTNNA1, DICER1, DIS3L2, EGFR (c.2369C>T, p.Thr790Met variant only), EPCAM (Deletion/duplication testing only), FH, FLCN, GATA2, GPC3, GREM1 (Promoter region deletion/duplication testing only), HOXB13 (c.251G>A, p.Gly84Glu), HRAS, KIT, MAX, MEN1, MET, MLH1, MSH2, MSH3, MSH6, MUTYH, NBN, NF1, NF2, NTHL1, PALB2, PDGFRA, PHOX2B, PMS2, POLD1, POLE, POT1, PRKAR1A, PTCH1, PTEN, RAD50, RAD51C, RAD51D, RB1, RECQL4, RET, RUNX1, SDHAF2, SDHA (sequence changes only), SDHB, SDHC, SDHD, SMAD4, SMARCA4, SMARCB1, SMARCE1, STK11, SUFU, TERT, TERT, TMEM127, TP53, TSC1, TSC2, VHL, WRN and WT1.         03/2017 -  Anti-estrogen  oral therapy   Letrozole daily     CHIEF COMPLIANT: Follow-up of left breast cancer on letrozole therapy  INTERVAL HISTORY: Virginia Lindsey is a 69 y.o. with above-mentioned history of left breast cancer treated with lumpectomy, radiation, and who is currently on letrozole therapy. Mammogram on 11/26/18 showed no evidence of malignancy bilaterally. She presents to the clinic today for annual follow-up.   Apart from mild discomfort in the breast that she has chronically there is no other problems or concerns.  She does have occasional hot flashes.  Denies any arthralgias or myalgias.  She continues have some pain and discomfort in the left hip related to a pinched nerve.  ALLERGIES:  is allergic to codeine, hydrocodone, and latex.  MEDICATIONS:  Current Outpatient Medications  Medication Sig Dispense Refill  . letrozole (FEMARA) 2.5 MG tablet TAKE 1 TABLET BY MOUTH EVERY DAY 90 tablet 3   No current facility-administered medications for this visit.    PHYSICAL EXAMINATION: ECOG PERFORMANCE STATUS: 1 - Symptomatic but completely ambulatory  Vitals:   08/27/19 0912  BP: (!) 147/89  Pulse: 73  Resp: 18  Temp: 98.2 F (36.8 C)  SpO2: 99%   Filed Weights   08/27/19 0912  Weight: 218 lb 4.8 oz (99 kg)    BREAST: No palpable masses or nodules in either right or left breasts. No palpable axillary supraclavicular or infraclavicular adenopathy no breast tenderness or nipple discharge. (exam performed in the presence of a chaperone)  LABORATORY DATA:  I have reviewed the data as listed CMP Latest Ref Rng & Units 08/23/2012  Glucose 70 - 99 mg/dL 91  BUN 6 - 23 mg/dL 12  Creatinine 0.50 -  1.10 mg/dL 0.80  Sodium 135 - 145 mEq/L 142  Potassium 3.5 - 5.1 mEq/L 3.5  Chloride 96 - 112 mEq/L 105  CO2 19 - 32 mEq/L 27  Calcium 8.4 - 10.5 mg/dL 9.4    Lab Results  Component Value Date   WBC 8.8 08/23/2012   HGB 13.7 08/23/2012   HCT 39.1 08/23/2012   MCV 90.3 08/23/2012   PLT  320 08/23/2012    ASSESSMENT & PLAN:  Malignant neoplasm of upper-outer quadrant of left breast in female, estrogen receptor positive ([facility]) 12/19/16:Left Lumpectomy: Grade 1 IDC, 0.6 cm, Er 100%, PR 100%, Her 2 Neg, Ki 67: 3%, T1bN0 (stage 1A) Adjuvant radiation therapy 01/24/2017-02/20/2017  Treatment plan: Adjuvant antiestrogen therapy with letrozole 2.5 mg daily started 03/03/2017 Letrozoletoxicities: Denies any major adverse effects to letrozole therapy. Breast tenderness: No palpable lumps or nodules.  Breast cancer surveillance: 1.Mammograms 11/26/2018: Benign breast density category B 2.breast exam 08/27/2019: Benign, tenderness in the left breast  Return to clinic in 1 year for follow-up    No orders of the defined types were placed in this encounter.  The patient has a good understanding of the overall plan. she agrees with it. she will call with any problems that may develop before the next visit here.  Total time spent: 20 mins including face to face time and time spent for planning, charting and coordination of care  Nicholas Lose, MD 08/27/2019  I, Cloyde Reams Dorshimer, am acting as scribe for Dr. Nicholas Lose.  I have reviewed the above documentation for accuracy and completeness, and I agree with the above.

## 2019-08-27 ENCOUNTER — Inpatient Hospital Stay: Payer: Medicare PPO | Attending: Hematology and Oncology | Admitting: Hematology and Oncology

## 2019-08-27 ENCOUNTER — Other Ambulatory Visit: Payer: Self-pay

## 2019-08-27 ENCOUNTER — Other Ambulatory Visit: Payer: Self-pay | Admitting: Hematology and Oncology

## 2019-08-27 DIAGNOSIS — Z170 Estrogen receptor positive status [ER+]: Secondary | ICD-10-CM | POA: Diagnosis not present

## 2019-08-27 DIAGNOSIS — C50412 Malignant neoplasm of upper-outer quadrant of left female breast: Secondary | ICD-10-CM | POA: Diagnosis not present

## 2019-08-27 DIAGNOSIS — Z79811 Long term (current) use of aromatase inhibitors: Secondary | ICD-10-CM | POA: Insufficient documentation

## 2019-08-27 DIAGNOSIS — Z923 Personal history of irradiation: Secondary | ICD-10-CM | POA: Insufficient documentation

## 2019-08-27 MED ORDER — LETROZOLE 2.5 MG PO TABS: 2.5000 mg | ORAL_TABLET | Freq: Every day | ORAL | 3 refills | Status: DC

## 2019-08-27 NOTE — Assessment & Plan Note (Signed)
12/19/16:Left Lumpectomy: Grade 1 IDC, 0.6 cm, Er 100%, PR 100%, Her 2 Neg, Ki 67: 3%, T1bN0 (stage 1A) Adjuvant radiation therapy 01/24/2017-02/20/2017  Treatment plan: Adjuvant antiestrogen therapy with letrozole 2.5 mg daily started 03/03/2017 Letrozoletoxicities: Denies any major adverse effects to letrozole therapy. Breast tenderness: No palpable lumps or nodules.  Breast cancer surveillance: 1.Mammograms 11/26/2018: Benign breast density category B 2.breast exam 08/27/2019: Benign, tenderness in the left breast  Return to clinic in 1 year for follow-up

## 2019-09-30 IMAGING — MG NEEDLE LOCALIZATION OF THE LEFT BREAST WITH MAMMO GUIDANCE
6 series · 6 of 6 positions shown · non-contrast
Comparison: Previous exam(s).

CLINICAL DATA: Localization of breast cancer

EXAM:
MAMMOGRAPHIC GUIDED RADIOACTIVE SEED LOCALIZATION OF THE LEFT BREAST

[L XCCL (1 of 3)]
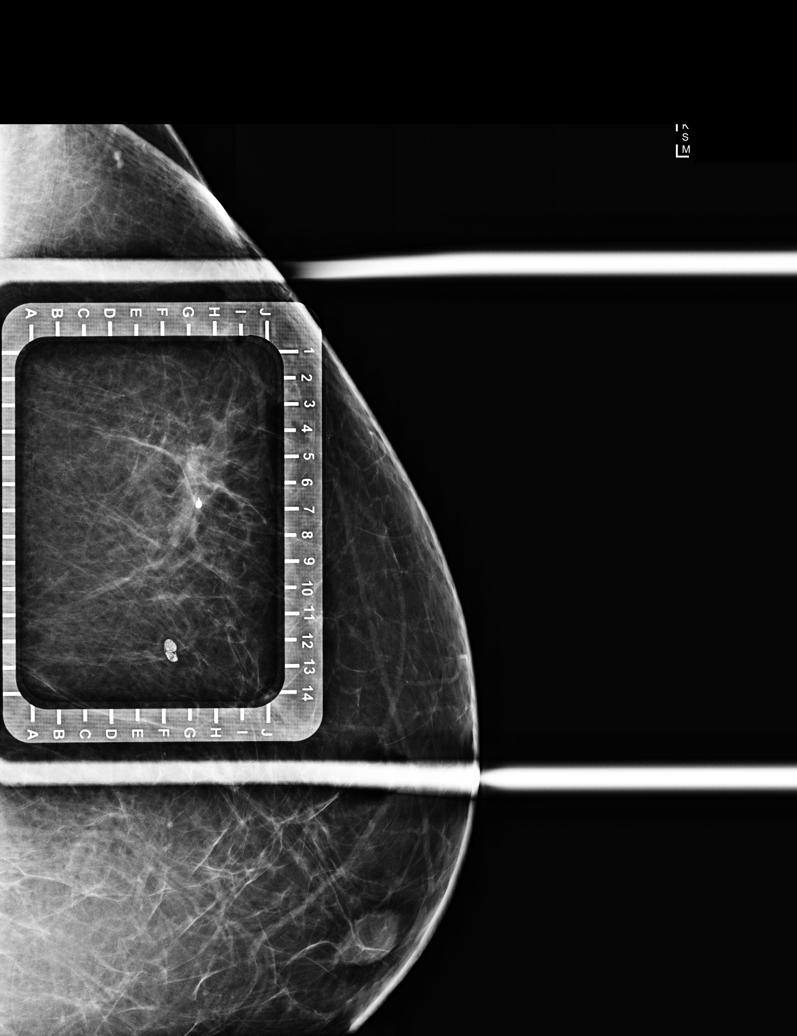

[L LM (1 of 3)]
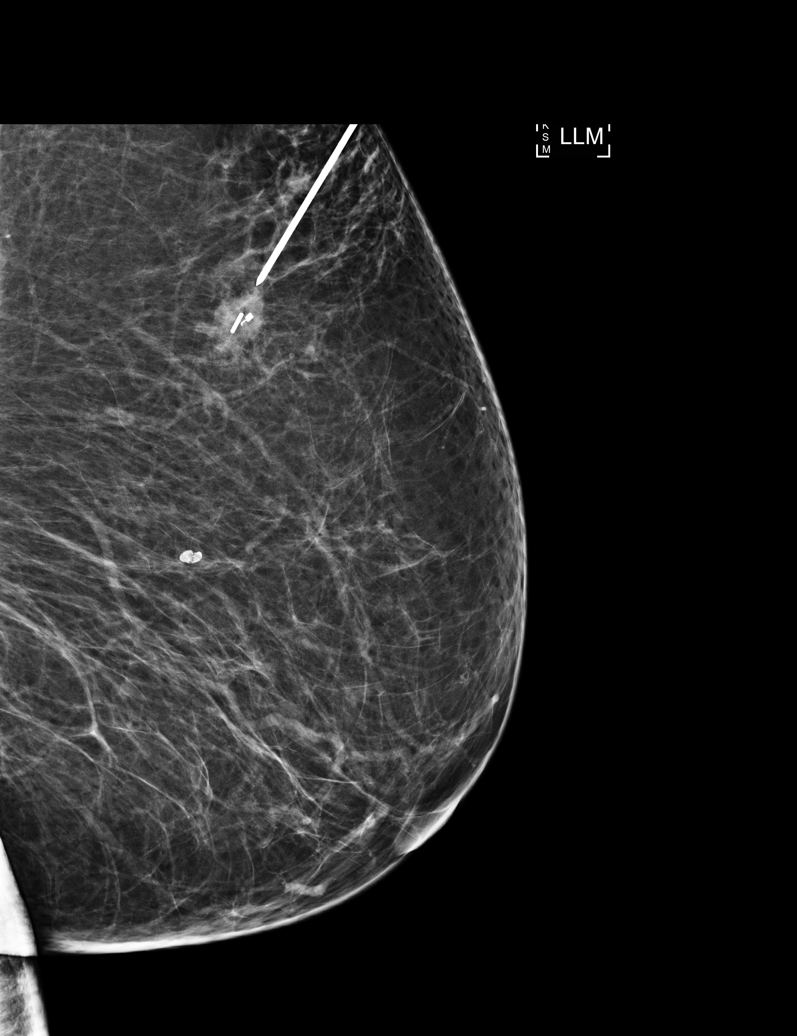

[L LM (2 of 3)]
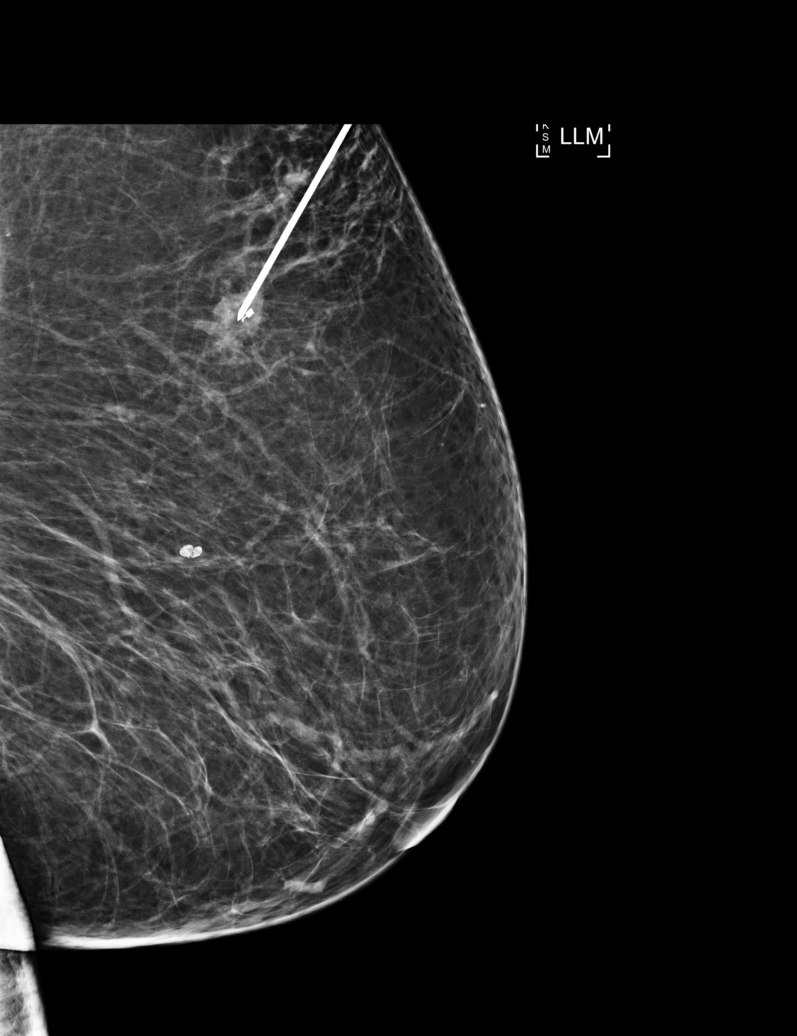

[L XCCL (2 of 3)]
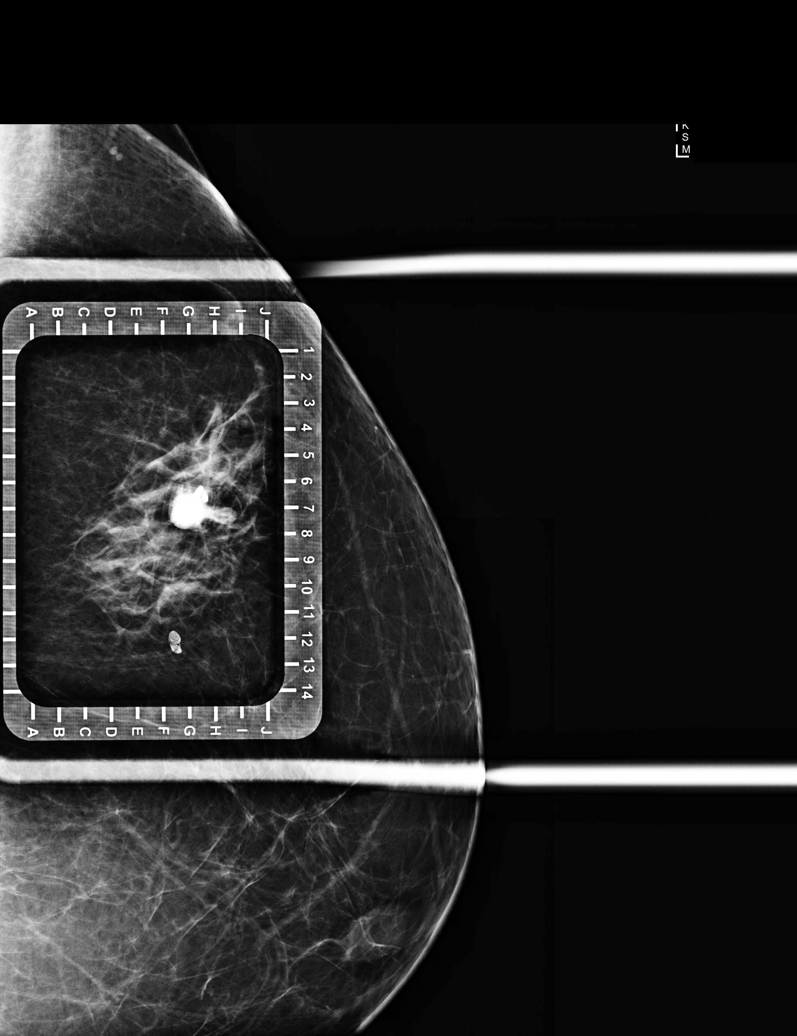

[L LM (3 of 3)]
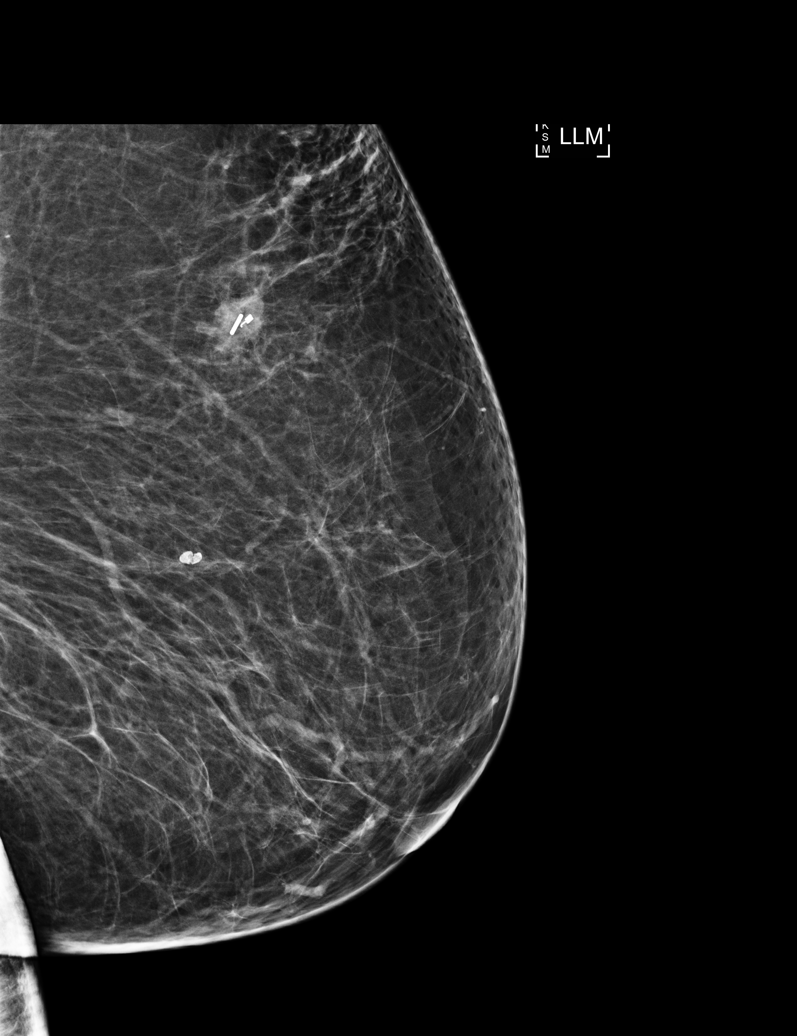

[L XCCL (3 of 3)]
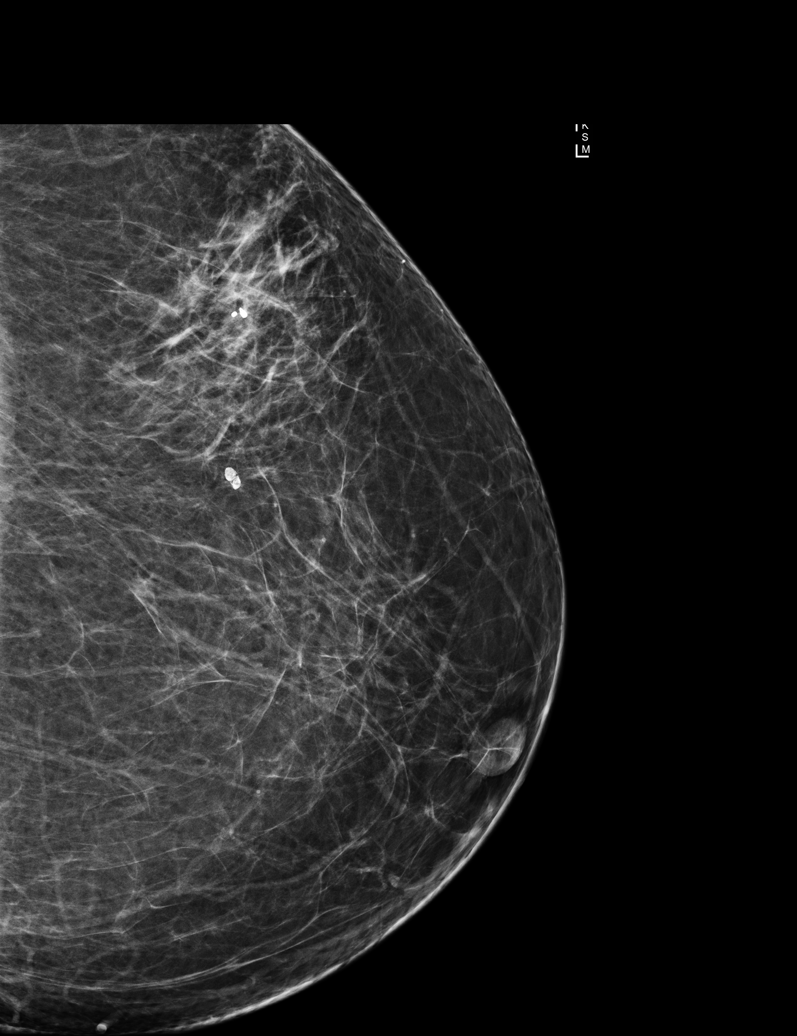

[6 of 6 positions shown; findings below may reference images not displayed]



The usual time-out protocol was performed immediately prior to the
procedure.

Using mammographic guidance, sterile technique, 1% lidocaine and an
E-EI6 radioactive seed, the patient's known breast cancer as marked
by the biopsy clip was localized using a superior approach. The
follow-up mammogram images confirm the seed in the expected location
and were marked for the surgeon.

Follow-up survey of the patient confirms presence of the radioactive
seed.

Order number of E-EI6 seed:  [id number].

Total activity:  0.255 millicuries  Reference Date: December 09, 2016

The patient tolerated the procedure well and was released from the
[REDACTED]. She was given instructions regarding seed removal.
IMPRESSION: Radioactive seed localization left breast. No apparent
complications.

## 2019-12-03 ENCOUNTER — Other Ambulatory Visit: Payer: Self-pay | Admitting: Hematology and Oncology

## 2019-12-03 DIAGNOSIS — Z853 Personal history of malignant neoplasm of breast: Secondary | ICD-10-CM

## 2019-12-08 ENCOUNTER — Encounter (HOSPITAL_BASED_OUTPATIENT_CLINIC_OR_DEPARTMENT_OTHER): Payer: Self-pay | Admitting: *Deleted

## 2019-12-08 ENCOUNTER — Emergency Department (HOSPITAL_BASED_OUTPATIENT_CLINIC_OR_DEPARTMENT_OTHER): Payer: Medicare PPO

## 2019-12-08 ENCOUNTER — Other Ambulatory Visit: Payer: Self-pay

## 2019-12-08 ENCOUNTER — Emergency Department (HOSPITAL_BASED_OUTPATIENT_CLINIC_OR_DEPARTMENT_OTHER)
Admission: EM | Admit: 2019-12-08 | Discharge: 2019-12-08 | Disposition: A | Discharge status: Home / Self Care | Payer: Medicare PPO | Attending: Emergency Medicine | Admitting: Emergency Medicine

## 2019-12-08 DIAGNOSIS — M79641 Pain in right hand: Secondary | ICD-10-CM | POA: Insufficient documentation

## 2019-12-08 DIAGNOSIS — W228XXA Striking against or struck by other objects, initial encounter: Secondary | ICD-10-CM | POA: Diagnosis not present

## 2019-12-08 DIAGNOSIS — Z79899 Other long term (current) drug therapy: Secondary | ICD-10-CM | POA: Diagnosis not present

## 2019-12-08 DIAGNOSIS — Z853 Personal history of malignant neoplasm of breast: Secondary | ICD-10-CM | POA: Insufficient documentation

## 2019-12-08 DIAGNOSIS — Z91040 Latex allergy status: Secondary | ICD-10-CM | POA: Insufficient documentation

## 2019-12-08 NOTE — ED Notes (Signed)
Pt able to remove rings from her right hand

## 2019-12-08 NOTE — ED Provider Notes (Signed)
[facility] EMERGENCY DEPARTMENT Provider Note   CSN: [id number] Arrival date & time: 12/08/19  0010     History Chief Complaint  Patient presents with  . Hand Injury    Virginia Lindsey is a 69 y.o. female.  The history is provided by the patient.  Hand Injury Location:  Hand Hand location:  R hand Injury: yes   Pain details:    Quality:  Aching   Radiates to:  Does not radiate   Severity:  Moderate   Onset quality:  Sudden   Duration:  1 week   Timing:  Constant Relieved by:  Rest Worsened by:  Movement Associated symptoms: no fever    Patient reports right hand pain.  Patient reports over a week ago she swatted something on a table and hit her hand.  Since that time she has had pain on the palmar aspect of the hand at times will have numbness in her middle and ring finger. She reports previous steroid injection in her hands by an orthopedist.  No other acute complaints    Past Medical History:  Diagnosis Date  . Back pain   . Breast cancer ([place])   . Family history of breast cancer   . Family history of melanoma   . Family history of renal cancer   . PONV (postoperative nausea and vomiting)   . Seizures ([place])    last seizure 2004  3 total 10 yrs apart  . Wears glasses     Patient Active Problem List   Diagnosis Date Noted  . Genetic testing 03/22/2017  . Family history of breast cancer   . Family history of melanoma   . Family history of renal cancer   . Malignant neoplasm of upper-outer quadrant of left breast in female, estrogen receptor positive ([place]) 12/27/2016  . 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 ABDOMINAL HYSTERECTOMY  1991   partial hysterectomy  . BREAST BIOPSY Left 10/29/2012   Procedure:  LEFT BREAST WIRE GUIDED EXCISION;  Surgeon: Rolm Bookbinder, MD;  Location: [facility];  Service: General;  Laterality: Left;  . BREAST EXCISIONAL BIOPSY    . BREAST LUMPECTOMY Left  12/19/2016  . BREAST LUMPECTOMY WITH RADIOACTIVE SEED AND SENTINEL LYMPH NODE BIOPSY Left 12/19/2016   Procedure: LEFT BREAST LUMPECTOMY WITH RADIOACTIVE SEED AND LEFT AXILLARY SENTINEL LYMPH NODE BIOPSY;  Surgeon: Rolm Bookbinder, MD;  Location: [facility];  Service: General;  Laterality: Left;  . BREAST SURGERY  2013   biopsy-lt  . CHOLECYSTECTOMY    . COLONOSCOPY    . LUMBAR LAMINECTOMY/DECOMPRESSION MICRODISCECTOMY Right 08/27/2012   Procedure: RIGHT  LUMBAR FOUR FIVE LAMINECTOMY/DECOMPRESSION MICRODISCECTOMY 1 LEVEL;  Surgeon: Elaina Hoops, MD;  Location: [facility];  Service: Neurosurgery;  Laterality: Right;  . UPPER GI ENDOSCOPY     multiple endoscopy's     OB History   No obstetric history on file.     Family History  Problem Relation Age of Onset  . Asthma Mother   . Cancer Mother        renal cell carcinoma, dx in her 24s, d. 26  . Cancer Father 53       melanoma on face; d. 44  . Diabetes Maternal Grandmother   . Breast cancer Sister 55       d. 83  . Breast cancer Paternal Aunt 25       dbl mastectomy  .  Cancer Paternal Uncle        NOS  . Melanoma Paternal Aunt   . Melanoma Cousin        pat first cousin; daughter of aunt with melanoma  . Lymphoma Son 4       [place] Lymphoma    Social History   Tobacco Use  . Smoking status: Never Smoker  . Smokeless tobacco: Never Used  Vaping Use  . Vaping Use: Never used  Substance Use Topics  . Alcohol use: No  . Drug use: No    Home Medications Prior to Admission medications   Medication Sig Start Date End Date Taking? Authorizing Provider  letrozole (FEMARA) 2.5 MG tablet Take 1 tablet (2.5 mg total) by mouth daily. 08/27/19   Nicholas Lose, MD    Allergies    Codeine, Hydrocodone, and Latex  Review of Systems   Review of Systems  Constitutional: Negative for fever.  Musculoskeletal: Positive for arthralgias and joint swelling.    Physical Exam Updated Vital Signs BP (!) 175/95  (BP Location: Right Arm)   Pulse 70   Temp 98.3 F (36.8 C)   Resp 16   Ht 1.6 m (5\' 3" )   Wt 98 kg   SpO2 99%   BMI 38.26 kg/m   Physical Exam  CONSTITUTIONAL: Well developed/well nourished HEAD: Normocephalic/atraumatic EYES: EOMI NECK: supple no meningeal signs LUNGS:  no apparent distress ABDOMEN: soft NEURO: Pt is awake/alert/appropriate, moves all extremitiesx4.  No facial droop.   EXTREMITIES: pulses normal/equal, full ROM, on palmar aspect of right hand she has mild swelling and bruising on the proximal aspect of the hand and volar wrist.  No deformities.  Distal pulses intact.  She has tenderness to deep palpation on the palmar aspect.  No other tenderness noted to her hand. SKIN: warm, color normal PSYCH: no abnormalities of mood noted, alert and oriented to situation  ED Results / Procedures / Treatments   Labs (all labs ordered are listed, but only abnormal results are displayed) Labs Reviewed - No data to display  EKG None  Radiology DG Hand Complete Right  Result Date: 12/08/2019 CLINICAL DATA:  Right hand pain EXAM: RIGHT HAND - COMPLETE 3+ VIEW COMPARISON:  None. FINDINGS: There is no evidence of fracture or dislocation. There is no evidence of arthropathy or other focal bone abnormality. Soft tissues are unremarkable. IMPRESSION: Negative. Electronically Signed   By: Ulyses Jarred M.D.   On: 12/08/2019 01:17    Procedures Procedures    Medications Ordered in ED Medications - No data to display  ED Course  I have reviewed the triage vital signs and the nursing notes.  Pertinent   imaging results that were available during my care of the patient were reviewed by me and considered in my medical decision making (see chart for details).    MDM Rules/Calculators/A&P                          Patient reports that she may have slammed her hand on a table over a week ago Since that time she is had pain and mild swelling.  She also reports intermittent  numbness of the middle and ring finger. Patient may have neuropraxia or swelling in the carpal tunnel causing some paresthesias in the fingers.  X-ray was reviewed and is negative.  Advised wrist splint and follow-up with her orthopedist next 1 to 2 weeks Final Clinical Impression(s) / ED Diagnoses Final diagnoses:  Right hand pain    Rx / DC Orders ED Discharge Orders    None       Ripley Fraise, MD 12/08/19 [phone]

## 2019-12-08 NOTE — ED Triage Notes (Signed)
C/o pain in right hand after hitting it on a table last week

## 2020-01-29 DIAGNOSIS — Z6836 Body mass index (BMI) 36.0-36.9, adult: Secondary | ICD-10-CM | POA: Diagnosis not present

## 2020-01-29 DIAGNOSIS — Z779 Other contact with and (suspected) exposures hazardous to health: Secondary | ICD-10-CM | POA: Diagnosis not present

## 2020-02-11 ENCOUNTER — Other Ambulatory Visit: Payer: Self-pay

## 2020-02-11 ENCOUNTER — Ambulatory Visit
Admission: RE | Admit: 2020-02-11 | Discharge: 2020-02-11 | Disposition: A | Discharge status: Home / Self Care | Payer: Medicare PPO | Source: Ambulatory Visit | Attending: Hematology and Oncology | Admitting: Hematology and Oncology

## 2020-02-11 DIAGNOSIS — Z853 Personal history of malignant neoplasm of breast: Secondary | ICD-10-CM

## 2020-02-11 DIAGNOSIS — R928 Other abnormal and inconclusive findings on diagnostic imaging of breast: Secondary | ICD-10-CM | POA: Diagnosis not present

## 2020-02-11 HISTORY — DX: Personal history of irradiation: Z92.3

## 2020-02-27 DIAGNOSIS — C50412 Malignant neoplasm of upper-outer quadrant of left female breast: Secondary | ICD-10-CM | POA: Diagnosis not present

## 2020-03-05 DIAGNOSIS — H5213 Myopia, bilateral: Secondary | ICD-10-CM | POA: Diagnosis not present

## 2020-03-05 DIAGNOSIS — H524 Presbyopia: Secondary | ICD-10-CM | POA: Diagnosis not present

## 2020-03-05 DIAGNOSIS — H52203 Unspecified astigmatism, bilateral: Secondary | ICD-10-CM | POA: Diagnosis not present

## 2020-03-05 DIAGNOSIS — H2513 Age-related nuclear cataract, bilateral: Secondary | ICD-10-CM | POA: Diagnosis not present

## 2020-03-09 DIAGNOSIS — N950 Postmenopausal bleeding: Secondary | ICD-10-CM | POA: Diagnosis not present

## 2020-03-24 DIAGNOSIS — K219 Gastro-esophageal reflux disease without esophagitis: Secondary | ICD-10-CM | POA: Diagnosis not present

## 2020-03-24 DIAGNOSIS — R142 Eructation: Secondary | ICD-10-CM | POA: Diagnosis not present

## 2020-04-02 DIAGNOSIS — H903 Sensorineural hearing loss, bilateral: Secondary | ICD-10-CM | POA: Diagnosis not present

## 2020-04-02 DIAGNOSIS — H9313 Tinnitus, bilateral: Secondary | ICD-10-CM | POA: Diagnosis not present

## 2020-04-08 DIAGNOSIS — H903 Sensorineural hearing loss, bilateral: Secondary | ICD-10-CM | POA: Diagnosis not present

## 2020-04-13 DIAGNOSIS — K2930 Chronic superficial gastritis without bleeding: Secondary | ICD-10-CM | POA: Diagnosis not present

## 2020-04-13 DIAGNOSIS — K2100 Gastro-esophageal reflux disease with esophagitis, without bleeding: Secondary | ICD-10-CM | POA: Diagnosis not present

## 2020-04-13 DIAGNOSIS — K621 Rectal polyp: Secondary | ICD-10-CM | POA: Diagnosis not present

## 2020-04-13 DIAGNOSIS — D120 Benign neoplasm of cecum: Secondary | ICD-10-CM | POA: Diagnosis not present

## 2020-04-13 DIAGNOSIS — Z1211 Encounter for screening for malignant neoplasm of colon: Secondary | ICD-10-CM | POA: Diagnosis not present

## 2020-04-13 DIAGNOSIS — K219 Gastro-esophageal reflux disease without esophagitis: Secondary | ICD-10-CM | POA: Diagnosis not present

## 2020-04-13 DIAGNOSIS — K222 Esophageal obstruction: Secondary | ICD-10-CM | POA: Diagnosis not present

## 2020-04-13 DIAGNOSIS — D125 Benign neoplasm of sigmoid colon: Secondary | ICD-10-CM | POA: Diagnosis not present

## 2020-04-13 DIAGNOSIS — D123 Benign neoplasm of transverse colon: Secondary | ICD-10-CM | POA: Diagnosis not present

## 2020-04-16 DIAGNOSIS — D125 Benign neoplasm of sigmoid colon: Secondary | ICD-10-CM | POA: Diagnosis not present

## 2020-04-16 DIAGNOSIS — D123 Benign neoplasm of transverse colon: Secondary | ICD-10-CM | POA: Diagnosis not present

## 2020-04-16 DIAGNOSIS — K219 Gastro-esophageal reflux disease without esophagitis: Secondary | ICD-10-CM | POA: Diagnosis not present

## 2020-04-16 DIAGNOSIS — K2930 Chronic superficial gastritis without bleeding: Secondary | ICD-10-CM | POA: Diagnosis not present

## 2020-04-16 DIAGNOSIS — D120 Benign neoplasm of cecum: Secondary | ICD-10-CM | POA: Diagnosis not present

## 2020-04-16 DIAGNOSIS — K621 Rectal polyp: Secondary | ICD-10-CM | POA: Diagnosis not present

## 2020-05-26 ENCOUNTER — Other Ambulatory Visit: Payer: Self-pay | Admitting: Urology

## 2020-05-26 DIAGNOSIS — Z8051 Family history of malignant neoplasm of kidney: Secondary | ICD-10-CM

## 2020-05-26 DIAGNOSIS — Z148 Genetic carrier of other disease: Secondary | ICD-10-CM

## 2020-05-27 ENCOUNTER — Other Ambulatory Visit: Payer: Self-pay | Admitting: Hematology and Oncology

## 2020-06-11 ENCOUNTER — Ambulatory Visit
Admission: RE | Admit: 2020-06-11 | Discharge: 2020-06-11 | Disposition: A | Discharge status: Home / Self Care | Payer: Medicare PPO | Source: Ambulatory Visit | Attending: Urology | Admitting: Urology

## 2020-06-11 DIAGNOSIS — Z8051 Family history of malignant neoplasm of kidney: Secondary | ICD-10-CM

## 2020-06-11 DIAGNOSIS — Z148 Genetic carrier of other disease: Secondary | ICD-10-CM

## 2020-07-16 DIAGNOSIS — M25512 Pain in left shoulder: Secondary | ICD-10-CM | POA: Diagnosis not present

## 2020-07-16 DIAGNOSIS — M778 Other enthesopathies, not elsewhere classified: Secondary | ICD-10-CM | POA: Diagnosis not present

## 2020-08-20 DIAGNOSIS — M25531 Pain in right wrist: Secondary | ICD-10-CM | POA: Diagnosis not present

## 2020-08-20 DIAGNOSIS — M654 Radial styloid tenosynovitis [de Quervain]: Secondary | ICD-10-CM | POA: Diagnosis not present

## 2020-08-24 NOTE — Assessment & Plan Note (Addendum)
12/19/16:Left Lumpectomy: Grade 1 IDC, 0.6 cm, Er 100%, PR 100%, Her 2 Neg, Ki 67: 3%, T1bN0 (stage 1A) Adjuvant radiation therapy 01/24/2017-02/20/2017  Treatment plan: Adjuvant antiestrogen therapy with letrozole 2.5 mg daily started 03/03/2017 Letrozoletoxicities:Denies any major adverse effects to letrozole therapy. Breast tenderness: No palpable lumps or nodules.  Breast cancer surveillance: 1.Mammograms  02/11/20:Benign breast density category B 2.breast exam 08/25/2020: Benign, tenderness in the left breast  Return to clinic in 1 year for follow-up

## 2020-08-24 NOTE — Progress Notes (Signed)
Patient Care Team: Antony Contras, MD as PCP - General (Family Medicine) Molli Posey, MD as Consulting Physician (Obstetrics and Gynecology) Nicholas Lose, MD as Consulting Physician (Hematology and Oncology) Eppie Gibson, MD as Attending Physician (Radiation Oncology) Rolm Bookbinder, MD as Consulting Physician (General Surgery)  DIAGNOSIS:    ICD-10-CM   1. 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 This gene mutation increases your risk to develop melanoma and kidney cancer.  There were no genetic changes in the following genes:   ALK, APC, ATM, AXIN2,BAP1,  BARD1, BLM, BMPR1A, BRCA1, BRCA2, BRIP1, CASR, CDC73, CDH1, CDK4, CDKN1B, CDKN1C, CDKN2A (p14ARF), CDKN2A (p16INK4a), CEBPA, CHEK2, CTNNA1, DICER1, DIS3L2, EGFR (c.2369C>T, p.Thr790Met variant only), EPCAM (Deletion/duplication testing only), FH, FLCN, GATA2, GPC3, GREM1 (Promoter region deletion/duplication testing only), HOXB13 (c.251G>A, p.Gly84Glu), HRAS, KIT, MAX, MEN1, MET, MLH1, MSH2, MSH3, MSH6, MUTYH, NBN, NF1, NF2, NTHL1, PALB2, PDGFRA, PHOX2B, PMS2, POLD1, POLE, POT1, PRKAR1A, PTCH1, PTEN, RAD50, RAD51C, RAD51D, RB1, RECQL4, RET, RUNX1, SDHAF2, SDHA (sequence changes only), SDHB, SDHC, SDHD, SMAD4, SMARCA4, SMARCB1, SMARCE1, STK11, SUFU, TERT, TERT, TMEM127, TP53, TSC1, TSC2, VHL, WRN and WT1.         03/2017 -   Anti-estrogen oral therapy   Letrozole daily     CHIEF COMPLIANT: Follow-up of left breast cancer on letrozole therapy  INTERVAL HISTORY: LARENDA REEDY is a 71 y.o. with above-mentioned history of left breast cancer treated with lumpectomy, radiation, and who is currently on letrozole therapy. Mammogram on 02/11/20 showed no evidence of malignancy bilaterally. She presents to the clinic today for annual follow-up.  She has had tendinitis in the left breast for which she is seeing a Copy.  They have her on a wrist splint and possibly additional testing will be done for neuropathy.  There is tendinitis started about couple of months ago.  ALLERGIES:  is allergic to codeine, hydrocodone, and latex.  MEDICATIONS:  Current Outpatient Medications  Medication Sig Dispense Refill   letrozole (FEMARA) 2.5 MG tablet TAKE 1 TABLET BY MOUTH EVERY DAY 90 tablet 3   No current facility-administered medications for this visit.    PHYSICAL EXAMINATION: ECOG PERFORMANCE STATUS: 1 - Symptomatic but completely ambulatory  Vitals:   08/25/20 0833  BP: 118/69  Pulse: 78  Resp: 18  Temp: (!) 97.5 F (36.4 C)  SpO2: 98%   Filed Weights   08/25/20 0833  Weight: 215 lb 9.6 oz (97.8 kg)    BREAST: No palpable masses or nodules in either right or left breasts. No palpable axillary supraclavicular or infraclavicular adenopathy no breast tenderness or nipple discharge. (exam performed in the presence of a chaperone)  LABORATORY DATA:  I have reviewed the data as listed CMP Latest Ref Rng & Units 08/23/2012  Glucose 70 - 99 mg/dL 91  BUN 6 - 23 mg/dL 12  Creatinine 0.50 - 1.10 mg/dL 0.80  Sodium 135 - 145 mEq/L 142  Potassium 3.5 - 5.1 mEq/L 3.5  Chloride 96 - 112 mEq/L 105  CO2 19 - 32 mEq/L 27  Calcium 8.4 - 10.5 mg/dL 9.4    Lab Results  Component Value Date   WBC 8.8 08/23/2012   HGB 13.7 08/23/2012   HCT 39.1 08/23/2012   MCV 90.3 08/23/2012   PLT 320 08/23/2012     ASSESSMENT & PLAN:  Malignant neoplasm of upper-outer quadrant of left breast in female, estrogen receptor positive ([place]) 12/19/16: Left Lumpectomy: Grade 1 IDC, 0.6 cm, Er 100%, PR 100%, Her 2 Neg, Ki 67: 3%, T1bN0 (stage 1A) Adjuvant radiation therapy 01/24/2017-02/20/2017   Treatment plan: Adjuvant antiestrogen therapy with letrozole 2.5 mg daily started 03/03/2017 Letrozole toxicities: Denies any major adverse effects to letrozole therapy.  Left wrist tendinitis: She does not attribute this to letrozole therapy.  I discussed with her that if the symptoms continue then she can stop the letrozole and see if her symptoms get any better.  She will think about it and decide.   Breast cancer surveillance: 1.  Mammograms  02/11/20: Benign breast density category B 2. breast exam 08/25/2020: Benign, tenderness in the left breast   I encouraged her to walk and exercise regularly. Return to clinic in 1 year for follow-up    No orders of the defined types were placed in this encounter.  The patient has a good understanding of the overall plan. she agrees with it. she will call with any problems that may develop before the next visit here.  Total time spent: 20 mins including face to face time and time spent for planning, charting and coordination of care  Rulon Eisenmenger, MD, MPH 08/25/2020  I, Thana Ates, am acting as scribe for Dr. Nicholas Lose.  I have reviewed the above documentation for accuracy and completeness, and I agree with the above.

## 2020-08-25 ENCOUNTER — Inpatient Hospital Stay: Payer: Medicare PPO | Attending: Hematology and Oncology | Admitting: Hematology and Oncology

## 2020-08-25 ENCOUNTER — Other Ambulatory Visit: Payer: Self-pay

## 2020-08-25 DIAGNOSIS — Z170 Estrogen receptor positive status [ER+]: Secondary | ICD-10-CM | POA: Diagnosis not present

## 2020-08-25 DIAGNOSIS — Z923 Personal history of irradiation: Secondary | ICD-10-CM | POA: Insufficient documentation

## 2020-08-25 DIAGNOSIS — C50412 Malignant neoplasm of upper-outer quadrant of left female breast: Secondary | ICD-10-CM | POA: Diagnosis not present

## 2020-08-25 DIAGNOSIS — Z79811 Long term (current) use of aromatase inhibitors: Secondary | ICD-10-CM | POA: Insufficient documentation

## 2020-08-25 MED ORDER — VITAMIN D 25 MCG (1000 UNIT) PO TABS: 1000.0000 [IU] | ORAL_TABLET | Freq: Every day | ORAL | Status: DC

## 2020-09-17 DIAGNOSIS — M654 Radial styloid tenosynovitis [de Quervain]: Secondary | ICD-10-CM | POA: Diagnosis not present

## 2020-09-17 DIAGNOSIS — G5603 Carpal tunnel syndrome, bilateral upper limbs: Secondary | ICD-10-CM | POA: Diagnosis not present

## 2020-10-15 DIAGNOSIS — G5603 Carpal tunnel syndrome, bilateral upper limbs: Secondary | ICD-10-CM | POA: Diagnosis not present

## 2020-10-15 DIAGNOSIS — M654 Radial styloid tenosynovitis [de Quervain]: Secondary | ICD-10-CM | POA: Diagnosis not present

## 2020-12-31 DIAGNOSIS — J019 Acute sinusitis, unspecified: Secondary | ICD-10-CM | POA: Diagnosis not present

## 2020-12-31 DIAGNOSIS — R058 Other specified cough: Secondary | ICD-10-CM | POA: Diagnosis not present

## 2020-12-31 DIAGNOSIS — R0981 Nasal congestion: Secondary | ICD-10-CM | POA: Diagnosis not present

## 2020-12-31 DIAGNOSIS — B9689 Other specified bacterial agents as the cause of diseases classified elsewhere: Secondary | ICD-10-CM | POA: Diagnosis not present

## 2020-12-31 DIAGNOSIS — J029 Acute pharyngitis, unspecified: Secondary | ICD-10-CM | POA: Diagnosis not present

## 2021-01-10 ENCOUNTER — Other Ambulatory Visit: Payer: Medicare PPO

## 2021-01-14 ENCOUNTER — Other Ambulatory Visit: Payer: Self-pay | Admitting: Hematology and Oncology

## 2021-01-14 DIAGNOSIS — Z1231 Encounter for screening mammogram for malignant neoplasm of breast: Secondary | ICD-10-CM

## 2021-02-11 ENCOUNTER — Ambulatory Visit: Payer: Medicare PPO

## 2021-02-15 ENCOUNTER — Ambulatory Visit: Payer: Medicare PPO

## 2021-02-24 ENCOUNTER — Encounter (HOSPITAL_COMMUNITY): Payer: Self-pay | Admitting: Emergency Medicine

## 2021-02-24 ENCOUNTER — Other Ambulatory Visit: Payer: Self-pay

## 2021-02-24 ENCOUNTER — Emergency Department (HOSPITAL_COMMUNITY): Payer: Medicare PPO

## 2021-02-24 ENCOUNTER — Emergency Department (HOSPITAL_COMMUNITY)
Admission: EM | Admit: 2021-02-24 | Discharge: 2021-02-24 | Disposition: A | Discharge status: Home / Self Care | Payer: Medicare PPO | Attending: Emergency Medicine | Admitting: Emergency Medicine

## 2021-02-24 ENCOUNTER — Telehealth: Payer: Self-pay | Admitting: *Deleted

## 2021-02-24 ENCOUNTER — Ambulatory Visit
Admission: RE | Admit: 2021-02-24 | Discharge: 2021-02-24 | Disposition: A | Discharge status: Home / Self Care | Payer: Medicare PPO | Source: Ambulatory Visit | Attending: Hematology and Oncology | Admitting: Hematology and Oncology

## 2021-02-24 DIAGNOSIS — Y92410 Unspecified street and highway as the place of occurrence of the external cause: Secondary | ICD-10-CM | POA: Diagnosis not present

## 2021-02-24 DIAGNOSIS — S20211A Contusion of right front wall of thorax, initial encounter: Secondary | ICD-10-CM | POA: Diagnosis not present

## 2021-02-24 DIAGNOSIS — Z041 Encounter for examination and observation following transport accident: Secondary | ICD-10-CM | POA: Diagnosis not present

## 2021-02-24 DIAGNOSIS — S50311A Abrasion of right elbow, initial encounter: Secondary | ICD-10-CM | POA: Insufficient documentation

## 2021-02-24 DIAGNOSIS — S8002XA Contusion of left knee, initial encounter: Secondary | ICD-10-CM | POA: Diagnosis not present

## 2021-02-24 DIAGNOSIS — R0789 Other chest pain: Secondary | ICD-10-CM | POA: Diagnosis not present

## 2021-02-24 DIAGNOSIS — R519 Headache, unspecified: Secondary | ICD-10-CM | POA: Diagnosis not present

## 2021-02-24 DIAGNOSIS — M4312 Spondylolisthesis, cervical region: Secondary | ICD-10-CM | POA: Diagnosis not present

## 2021-02-24 DIAGNOSIS — S299XXA Unspecified injury of thorax, initial encounter: Secondary | ICD-10-CM | POA: Diagnosis not present

## 2021-02-24 DIAGNOSIS — Z853 Personal history of malignant neoplasm of breast: Secondary | ICD-10-CM | POA: Diagnosis not present

## 2021-02-24 DIAGNOSIS — M25562 Pain in left knee: Secondary | ICD-10-CM | POA: Diagnosis not present

## 2021-02-24 DIAGNOSIS — Z91040 Latex allergy status: Secondary | ICD-10-CM | POA: Diagnosis not present

## 2021-02-24 DIAGNOSIS — M50322 Other cervical disc degeneration at C5-C6 level: Secondary | ICD-10-CM | POA: Diagnosis not present

## 2021-02-24 DIAGNOSIS — S161XXA Strain of muscle, fascia and tendon at neck level, initial encounter: Secondary | ICD-10-CM | POA: Diagnosis not present

## 2021-02-24 DIAGNOSIS — M25561 Pain in right knee: Secondary | ICD-10-CM | POA: Diagnosis not present

## 2021-02-24 DIAGNOSIS — R1084 Generalized abdominal pain: Secondary | ICD-10-CM | POA: Diagnosis not present

## 2021-02-24 DIAGNOSIS — S199XXA Unspecified injury of neck, initial encounter: Secondary | ICD-10-CM | POA: Diagnosis present

## 2021-02-24 DIAGNOSIS — S3991XA Unspecified injury of abdomen, initial encounter: Secondary | ICD-10-CM | POA: Diagnosis not present

## 2021-02-24 DIAGNOSIS — Z20822 Contact with and (suspected) exposure to covid-19: Secondary | ICD-10-CM | POA: Diagnosis not present

## 2021-02-24 DIAGNOSIS — S0990XA Unspecified injury of head, initial encounter: Secondary | ICD-10-CM | POA: Diagnosis not present

## 2021-02-24 DIAGNOSIS — S8001XA Contusion of right knee, initial encounter: Secondary | ICD-10-CM | POA: Diagnosis not present

## 2021-02-24 DIAGNOSIS — I517 Cardiomegaly: Secondary | ICD-10-CM | POA: Diagnosis not present

## 2021-02-24 DIAGNOSIS — Z1231 Encounter for screening mammogram for malignant neoplasm of breast: Secondary | ICD-10-CM | POA: Diagnosis not present

## 2021-02-24 DIAGNOSIS — J40 Bronchitis, not specified as acute or chronic: Secondary | ICD-10-CM | POA: Diagnosis not present

## 2021-02-24 DIAGNOSIS — R102 Pelvic and perineal pain: Secondary | ICD-10-CM | POA: Diagnosis not present

## 2021-02-24 DIAGNOSIS — E876 Hypokalemia: Secondary | ICD-10-CM | POA: Diagnosis not present

## 2021-02-24 DIAGNOSIS — T1490XA Injury, unspecified, initial encounter: Secondary | ICD-10-CM

## 2021-02-24 DIAGNOSIS — M25521 Pain in right elbow: Secondary | ICD-10-CM | POA: Diagnosis not present

## 2021-02-24 DIAGNOSIS — M50321 Other cervical disc degeneration at C4-C5 level: Secondary | ICD-10-CM | POA: Diagnosis not present

## 2021-02-24 LAB — URINALYSIS, ROUTINE W REFLEX MICROSCOPIC
Bilirubin Urine: NEGATIVE
Glucose, UA: NEGATIVE mg/dL
Hgb urine dipstick: NEGATIVE
Ketones, ur: NEGATIVE mg/dL
Leukocytes,Ua: NEGATIVE
Nitrite: NEGATIVE
Protein, ur: NEGATIVE mg/dL
Specific Gravity, Urine: 1.028 (ref 1.005–1.030)
pH: 6 (ref 5.0–8.0)

## 2021-02-24 LAB — CBC
HCT: 42 % (ref 36.0–46.0)
Hemoglobin: 14.2 g/dL (ref 12.0–15.0)
MCH: 31.3 pg (ref 26.0–34.0)
MCHC: 33.8 g/dL (ref 30.0–36.0)
MCV: 92.5 fL (ref 80.0–100.0)
Platelets: 291 10*3/uL (ref 150–400)
RBC: 4.54 MIL/uL (ref 3.87–5.11)
RDW: 12.4 % (ref 11.5–15.5)
WBC: 9.8 10*3/uL (ref 4.0–10.5)
nRBC: 0 % (ref 0.0–0.2)

## 2021-02-24 LAB — RESP PANEL BY RT-PCR (FLU A&B, COVID) ARPGX2
Influenza A by PCR: NEGATIVE
Influenza B by PCR: NEGATIVE
SARS Coronavirus 2 by RT PCR: NEGATIVE

## 2021-02-24 LAB — COMPREHENSIVE METABOLIC PANEL
ALT: 20 U/L (ref 0–44)
AST: 28 U/L (ref 15–41)
Albumin: 3.8 g/dL (ref 3.5–5.0)
Alkaline Phosphatase: 87 U/L (ref 38–126)
Anion gap: 12 (ref 5–15)
BUN: 9 mg/dL (ref 8–23)
CO2: 24 mmol/L (ref 22–32)
Calcium: 9.1 mg/dL (ref 8.9–10.3)
Chloride: 104 mmol/L (ref 98–111)
Creatinine, Ser: 0.91 mg/dL (ref 0.44–1.00)
GFR, Estimated: >60 mL/min (ref >60)
Glucose, Bld: 103 mg/dL — ABNORMAL HIGH (ref 70–99)
Potassium: 3.1 mmol/L — ABNORMAL LOW (ref 3.5–5.1)
Sodium: 140 mmol/L (ref 135–145)
Total Bilirubin: 0.6 mg/dL (ref 0.3–1.2)
Total Protein: 7.1 g/dL (ref 6.5–8.1)

## 2021-02-24 LAB — LACTIC ACID, PLASMA: Lactic Acid, Venous: 1.3 mmol/L (ref 0.5–1.9)

## 2021-02-24 LAB — PROTIME-INR
INR: 1 (ref 0.8–1.2)
Prothrombin Time: 13.5 seconds (ref 11.4–15.2)

## 2021-02-24 MED ORDER — MORPHINE SULFATE (PF) 4 MG/ML IV SOLN
4.0000 mg | Freq: Once | INTRAVENOUS | Status: AC
  Administered 2021-02-24: 4 mg via INTRAVENOUS
  Filled 2021-02-24: qty 1

## 2021-02-24 MED ORDER — DOXYCYCLINE HYCLATE 100 MG PO CAPS
100.0000 mg | ORAL_CAPSULE | Freq: Two times a day (BID) | ORAL | 0 refills | Status: DC
Start: 2021-02-24 — End: 2021-03-12

## 2021-02-24 MED ORDER — ONDANSETRON HCL 4 MG/2ML IJ SOLN
4.0000 mg | Freq: Once | INTRAMUSCULAR | Status: AC
  Administered 2021-02-24: 4 mg via INTRAVENOUS
  Filled 2021-02-24: qty 2

## 2021-02-24 MED ORDER — POTASSIUM CHLORIDE CRYS ER 20 MEQ PO TBCR
40.0000 meq | EXTENDED_RELEASE_TABLET | Freq: Once | ORAL | Status: AC
Start: 2021-02-24 — End: 2021-02-24
  Administered 2021-02-24: 40 meq via ORAL
  Filled 2021-02-24: qty 2

## 2021-02-24 MED ORDER — SODIUM CHLORIDE 0.9 % IV BOLUS
1000.0000 mL | Freq: Once | INTRAVENOUS | Status: AC
  Administered 2021-02-24: 1000 mL via INTRAVENOUS

## 2021-02-24 MED ORDER — IOHEXOL 300 MG/ML  SOLN
80.0000 mL | Freq: Once | INTRAMUSCULAR | Status: AC | PRN
  Administered 2021-02-24: 80 mL via INTRAVENOUS

## 2021-02-24 MED ORDER — SODIUM CHLORIDE 0.9 % IV SOLN: INTRAVENOUS | Status: DC

## 2021-02-24 NOTE — Telephone Encounter (Signed)
Received call from pt requesting advice if it has been 5 years since starting Letrozole and if she needs to continue.  Per last MD office not, pt started Letrozole March 2019.  Pt educated she will need to continue for a least another year and MD will review with her during her visit this fall. Pt appreciative of advice.

## 2021-02-24 NOTE — ED Notes (Signed)
Pt verbalized understanding of d/c instructions, meds and followup care. Denies questions. VSS, no distress noted. Steady gait to exit with all belongings.  ?

## 2021-02-24 NOTE — ED Notes (Signed)
Patient transported to X-ray 
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
## 2021-02-24 NOTE — ED Notes (Signed)
Pt ambulated with a steady gait in the hallway.
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
## 2021-02-26 DIAGNOSIS — I700 Atherosclerosis of aorta: Secondary | ICD-10-CM | POA: Diagnosis not present

## 2021-02-26 DIAGNOSIS — Z170 Estrogen receptor positive status [ER+]: Secondary | ICD-10-CM | POA: Diagnosis not present

## 2021-02-26 DIAGNOSIS — C50412 Malignant neoplasm of upper-outer quadrant of left female breast: Secondary | ICD-10-CM | POA: Diagnosis not present

## 2021-02-26 DIAGNOSIS — M25552 Pain in left hip: Secondary | ICD-10-CM | POA: Diagnosis not present

## 2021-02-26 DIAGNOSIS — E876 Hypokalemia: Secondary | ICD-10-CM | POA: Diagnosis not present

## 2021-02-26 DIAGNOSIS — E559 Vitamin D deficiency, unspecified: Secondary | ICD-10-CM | POA: Diagnosis not present

## 2021-02-26 DIAGNOSIS — R930 Abnormal findings on diagnostic imaging of skull and head, not elsewhere classified: Secondary | ICD-10-CM | POA: Diagnosis not present

## 2021-02-26 DIAGNOSIS — C50912 Malignant neoplasm of unspecified site of left female breast: Secondary | ICD-10-CM | POA: Diagnosis not present

## 2021-02-26 DIAGNOSIS — R918 Other nonspecific abnormal finding of lung field: Secondary | ICD-10-CM | POA: Diagnosis not present

## 2021-02-26 DIAGNOSIS — J209 Acute bronchitis, unspecified: Secondary | ICD-10-CM | POA: Diagnosis not present

## 2021-03-03 ENCOUNTER — Emergency Department (HOSPITAL_COMMUNITY): Payer: Medicare PPO

## 2021-03-03 ENCOUNTER — Emergency Department (HOSPITAL_COMMUNITY)
Admission: EM | Admit: 2021-03-03 | Discharge: 2021-03-03 | Disposition: A | Discharge status: Home / Self Care | Payer: Medicare PPO | Attending: Emergency Medicine | Admitting: Emergency Medicine

## 2021-03-03 ENCOUNTER — Other Ambulatory Visit (HOSPITAL_COMMUNITY): Payer: Self-pay | Admitting: Family Medicine

## 2021-03-03 ENCOUNTER — Other Ambulatory Visit: Payer: Self-pay

## 2021-03-03 ENCOUNTER — Encounter (HOSPITAL_COMMUNITY): Payer: Self-pay

## 2021-03-03 ENCOUNTER — Other Ambulatory Visit: Payer: Self-pay | Admitting: Family Medicine

## 2021-03-03 ENCOUNTER — Other Ambulatory Visit (HOSPITAL_COMMUNITY): Payer: Self-pay | Admitting: Interventional Radiology

## 2021-03-03 ENCOUNTER — Ambulatory Visit
Admission: RE | Admit: 2021-03-03 | Discharge: 2021-03-03 | Disposition: A | Discharge status: Home / Self Care | Payer: Medicare PPO | Source: Ambulatory Visit | Attending: Family Medicine | Admitting: Family Medicine

## 2021-03-03 DIAGNOSIS — R0602 Shortness of breath: Secondary | ICD-10-CM | POA: Diagnosis not present

## 2021-03-03 DIAGNOSIS — R079 Chest pain, unspecified: Secondary | ICD-10-CM | POA: Diagnosis not present

## 2021-03-03 DIAGNOSIS — J40 Bronchitis, not specified as acute or chronic: Secondary | ICD-10-CM | POA: Insufficient documentation

## 2021-03-03 DIAGNOSIS — Z853 Personal history of malignant neoplasm of breast: Secondary | ICD-10-CM | POA: Insufficient documentation

## 2021-03-03 DIAGNOSIS — R930 Abnormal findings on diagnostic imaging of skull and head, not elsewhere classified: Secondary | ICD-10-CM

## 2021-03-03 DIAGNOSIS — S2002XD Contusion of left breast, subsequent encounter: Secondary | ICD-10-CM | POA: Insufficient documentation

## 2021-03-03 DIAGNOSIS — R059 Cough, unspecified: Secondary | ICD-10-CM | POA: Diagnosis not present

## 2021-03-03 DIAGNOSIS — I671 Cerebral aneurysm, nonruptured: Secondary | ICD-10-CM

## 2021-03-03 DIAGNOSIS — R911 Solitary pulmonary nodule: Secondary | ICD-10-CM | POA: Diagnosis not present

## 2021-03-03 DIAGNOSIS — Z91040 Latex allergy status: Secondary | ICD-10-CM | POA: Diagnosis not present

## 2021-03-03 LAB — BASIC METABOLIC PANEL
Anion gap: 11 (ref 5–15)
BUN: 12 mg/dL (ref 8–23)
CO2: 23 mmol/L (ref 22–32)
Calcium: 9 mg/dL (ref 8.9–10.3)
Chloride: 105 mmol/L (ref 98–111)
Creatinine, Ser: 0.87 mg/dL (ref 0.44–1.00)
GFR, Estimated: >60 mL/min (ref >60)
Glucose, Bld: 100 mg/dL — ABNORMAL HIGH (ref 70–99)
Potassium: 3.5 mmol/L (ref 3.5–5.1)
Sodium: 139 mmol/L (ref 135–145)

## 2021-03-03 LAB — CBC
HCT: 40.2 % (ref 36.0–46.0)
Hemoglobin: 13.7 g/dL (ref 12.0–15.0)
MCH: 31.1 pg (ref 26.0–34.0)
MCHC: 34.1 g/dL (ref 30.0–36.0)
MCV: 91.2 fL (ref 80.0–100.0)
Platelets: 353 10*3/uL (ref 150–400)
RBC: 4.41 MIL/uL (ref 3.87–5.11)
RDW: 12.7 % (ref 11.5–15.5)
WBC: 10.1 10*3/uL (ref 4.0–10.5)
nRBC: 0 % (ref 0.0–0.2)

## 2021-03-03 LAB — TROPONIN I (HIGH SENSITIVITY): Troponin I (High Sensitivity): 7 ng/L (ref <18)

## 2021-03-03 MED ORDER — IOPAMIDOL (ISOVUE-370) INJECTION 76%
75.0000 mL | Freq: Once | INTRAVENOUS | Status: AC | PRN
  Administered 2021-03-03: 75 mL via INTRAVENOUS

## 2021-03-03 MED ORDER — PREDNISONE 20 MG PO TABS
40.0000 mg | ORAL_TABLET | Freq: Every day | ORAL | 0 refills | Status: AC
Start: 2021-03-03 — End: 2021-03-08

## 2021-03-03 MED ORDER — ACETAMINOPHEN 325 MG PO TABS
650.0000 mg | ORAL_TABLET | Freq: Once | ORAL | Status: AC
  Administered 2021-03-03: 650 mg via ORAL
  Filled 2021-03-03: qty 2

## 2021-03-03 MED ORDER — IOHEXOL 350 MG/ML SOLN
65.0000 mL | Freq: Once | INTRAVENOUS | Status: AC | PRN
  Administered 2021-03-03: 65 mL via INTRAVENOUS

## 2021-03-03 MED ORDER — AEROCHAMBER PLUS FLO-VU LARGE MISC
1.0000 | Freq: Once | Status: AC
  Administered 2021-03-03: 1

## 2021-03-03 MED ORDER — IPRATROPIUM-ALBUTEROL 0.5-2.5 (3) MG/3ML IN SOLN
3.0000 mL | Freq: Once | RESPIRATORY_TRACT | Status: AC
  Administered 2021-03-03: 3 mL via RESPIRATORY_TRACT
  Filled 2021-03-03: qty 3

## 2021-03-03 MED ORDER — AEROCHAMBER PLUS FLO-VU LARGE MISC
Status: AC
  Filled 2021-03-03: qty 1

## 2021-03-03 MED ORDER — ALBUTEROL SULFATE HFA 108 (90 BASE) MCG/ACT IN AERS
2.0000 | INHALATION_SPRAY | Freq: Once | RESPIRATORY_TRACT | Status: AC
  Administered 2021-03-03: 2 via RESPIRATORY_TRACT
  Filled 2021-03-03: qty 6.7

## 2021-03-03 MED ORDER — BENZONATATE 100 MG PO CAPS: 100.0000 mg | ORAL_CAPSULE | Freq: Three times a day (TID) | ORAL | 0 refills | Status: DC

## 2021-03-03 MED ORDER — DEXAMETHASONE SODIUM PHOSPHATE 10 MG/ML IJ SOLN
10.0000 mg | Freq: Once | INTRAMUSCULAR | Status: AC
  Administered 2021-03-03: 10 mg via INTRAVENOUS
  Filled 2021-03-03: qty 1

## 2021-03-03 NOTE — Discharge Instructions (Addendum)
Your blood work today was reassuring.  We also did a CT scan of your chest which did not show any blood clots or other concerning findings.  It does show that you have changes that are consistent with bronchitis.  There is no rib fracture noted explain your pain either.  Continue taking Tylenol or ibuprofen as you need to for pain control.  I will send in prednisone, albuterol inhaler to the pharmacy for you.  ?Your CT angiogram of your head from this morning showed you have an aneurysm.  It did not show any concerning characteristics of this aneurysm.  The size was 8 mm.  Per my discussion with you you got a call from Dr. Moreen Fowler who will refer you to neurosurgery for further evaluation and management. ?
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
## 2021-03-03 NOTE — ED Notes (Signed)
Patient transported to CT 
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
## 2021-03-03 NOTE — ED Triage Notes (Signed)
Pt reports continued problems from bronchitis, dx in mid-February. Has completed two rounds of abx. Sent by PCP for steroids, as she continues to have sob and cough. Also c/o L sided breast/chest pain, worse with movement and coughing.  ?

## 2021-03-03 NOTE — ED Notes (Signed)
Pt ambulatory to and from the bathroom ?

## 2021-03-09 DIAGNOSIS — H524 Presbyopia: Secondary | ICD-10-CM | POA: Diagnosis not present

## 2021-03-09 DIAGNOSIS — H52203 Unspecified astigmatism, bilateral: Secondary | ICD-10-CM | POA: Diagnosis not present

## 2021-03-09 DIAGNOSIS — H25813 Combined forms of age-related cataract, bilateral: Secondary | ICD-10-CM | POA: Diagnosis not present

## 2021-03-09 DIAGNOSIS — H5213 Myopia, bilateral: Secondary | ICD-10-CM | POA: Diagnosis not present

## 2021-03-12 ENCOUNTER — Encounter: Payer: Self-pay | Admitting: Pulmonary Disease

## 2021-03-12 ENCOUNTER — Other Ambulatory Visit: Payer: Self-pay

## 2021-03-12 ENCOUNTER — Ambulatory Visit: Payer: Medicare PPO | Admitting: Pulmonary Disease

## 2021-03-12 ENCOUNTER — Ambulatory Visit (HOSPITAL_COMMUNITY)
Admission: RE | Admit: 2021-03-12 | Discharge: 2021-03-12 | Disposition: A | Discharge status: Home / Self Care | Payer: Medicare PPO | Source: Ambulatory Visit | Attending: Interventional Radiology | Admitting: Interventional Radiology

## 2021-03-12 VITALS — BP 138/84 | HR 73 | Ht 64.0 in | Wt 215.2 lb

## 2021-03-12 DIAGNOSIS — R930 Abnormal findings on diagnostic imaging of skull and head, not elsewhere classified: Secondary | ICD-10-CM

## 2021-03-12 DIAGNOSIS — R591 Generalized enlarged lymph nodes: Secondary | ICD-10-CM

## 2021-03-12 DIAGNOSIS — J42 Unspecified chronic bronchitis: Secondary | ICD-10-CM | POA: Diagnosis not present

## 2021-03-12 DIAGNOSIS — I671 Cerebral aneurysm, nonruptured: Secondary | ICD-10-CM | POA: Diagnosis not present
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
## 2021-03-12 NOTE — Patient Instructions (Addendum)
Borderline mediastinal and hilar lymphadenopathy ?RUL lung nodule ?--ORDER CT Chest with contrast in 3 months ? ?Chronic Bronchitis ?--ARRANGE pulmonary function test in 3 months ?--CONTINUE Albuterol AS NEEDED for shortness of breath or wheezing ? ?Follow-up with me in 3 months with PFTs prior to visit ?

## 2021-03-15 HISTORY — PX: IR RADIOLOGIST EVAL & MGMT: IMG5224

## 2021-03-17 ENCOUNTER — Other Ambulatory Visit (HOSPITAL_COMMUNITY): Payer: Self-pay | Admitting: Interventional Radiology

## 2021-03-17 DIAGNOSIS — R930 Abnormal findings on diagnostic imaging of skull and head, not elsewhere classified: Secondary | ICD-10-CM

## 2021-03-17 DIAGNOSIS — I671 Cerebral aneurysm, nonruptured: Secondary | ICD-10-CM
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
## 2021-03-18 ENCOUNTER — Ambulatory Visit (HOSPITAL_COMMUNITY)
Admission: RE | Admit: 2021-03-18 | Discharge: 2021-03-18 | Disposition: A | Discharge status: Home / Self Care | Payer: Medicare PPO | Source: Ambulatory Visit | Attending: Interventional Radiology | Admitting: Interventional Radiology

## 2021-03-18 ENCOUNTER — Other Ambulatory Visit (HOSPITAL_COMMUNITY): Payer: Self-pay | Admitting: Interventional Radiology

## 2021-03-18 ENCOUNTER — Other Ambulatory Visit: Payer: Self-pay

## 2021-03-18 ENCOUNTER — Encounter (HOSPITAL_COMMUNITY): Payer: Self-pay

## 2021-03-18 DIAGNOSIS — R569 Unspecified convulsions: Secondary | ICD-10-CM | POA: Insufficient documentation

## 2021-03-18 DIAGNOSIS — I671 Cerebral aneurysm, nonruptured: Secondary | ICD-10-CM

## 2021-03-18 DIAGNOSIS — Z853 Personal history of malignant neoplasm of breast: Secondary | ICD-10-CM | POA: Diagnosis not present

## 2021-03-18 DIAGNOSIS — R930 Abnormal findings on diagnostic imaging of skull and head, not elsewhere classified: Secondary | ICD-10-CM

## 2021-03-18 HISTORY — PX: IR ANGIO VERTEBRAL SEL VERTEBRAL BILAT MOD SED: IMG5369

## 2021-03-18 HISTORY — PX: IR ANGIO INTRA EXTRACRAN SEL COM CAROTID INNOMINATE BILAT MOD SED: IMG5360

## 2021-03-18 HISTORY — PX: IR 3D INDEPENDENT WKST: IMG2385

## 2021-03-18 LAB — BASIC METABOLIC PANEL
Anion gap: 10 (ref 5–15)
BUN: 14 mg/dL (ref 8–23)
CO2: 23 mmol/L (ref 22–32)
Calcium: 8.7 mg/dL — ABNORMAL LOW (ref 8.9–10.3)
Chloride: 107 mmol/L (ref 98–111)
Creatinine, Ser: 0.91 mg/dL (ref 0.44–1.00)
GFR, Estimated: >60 mL/min (ref >60)
Glucose, Bld: 100 mg/dL — ABNORMAL HIGH (ref 70–99)
Potassium: 3.6 mmol/L (ref 3.5–5.1)
Sodium: 140 mmol/L (ref 135–145)

## 2021-03-18 LAB — CBC
HCT: 39.4 % (ref 36.0–46.0)
Hemoglobin: 13.2 g/dL (ref 12.0–15.0)
MCH: 31.6 pg (ref 26.0–34.0)
MCHC: 33.5 g/dL (ref 30.0–36.0)
MCV: 94.3 fL (ref 80.0–100.0)
Platelets: 156 10*3/uL (ref 150–400)
RBC: 4.18 MIL/uL (ref 3.87–5.11)
RDW: 13.2 % (ref 11.5–15.5)
WBC: 8 10*3/uL (ref 4.0–10.5)
nRBC: 0 % (ref 0.0–0.2)

## 2021-03-18 LAB — PROTIME-INR
INR: 0.9 (ref 0.8–1.2)
Prothrombin Time: 12.6 seconds (ref 11.4–15.2)

## 2021-03-18 MED ORDER — IOHEXOL 300 MG/ML  SOLN
100.0000 mL | Freq: Once | INTRAMUSCULAR | Status: AC | PRN
  Administered 2021-03-18: 45 mL via INTRAVENOUS

## 2021-03-18 MED ORDER — HYDRALAZINE HCL 20 MG/ML IJ SOLN
INTRAMUSCULAR | Status: AC
  Filled 2021-03-18: qty 1

## 2021-03-18 MED ORDER — MIDAZOLAM HCL 2 MG/2ML IJ SOLN
INTRAMUSCULAR | Status: AC
  Filled 2021-03-18: qty 2

## 2021-03-18 MED ORDER — HEPARIN SODIUM (PORCINE) 1000 UNIT/ML IJ SOLN
INTRAMUSCULAR | Status: AC
  Filled 2021-03-18: qty 10

## 2021-03-18 MED ORDER — FENTANYL CITRATE (PF) 100 MCG/2ML IJ SOLN
INTRAMUSCULAR | Status: AC | PRN
Start: 2021-03-18 — End: 2021-03-18
  Administered 2021-03-18: 25 ug via INTRAVENOUS

## 2021-03-18 MED ORDER — SODIUM CHLORIDE 0.9 % IV SOLN: INTRAVENOUS | Status: DC

## 2021-03-18 MED ORDER — FENTANYL CITRATE (PF) 100 MCG/2ML IJ SOLN
INTRAMUSCULAR | Status: AC
  Filled 2021-03-18: qty 2

## 2021-03-18 MED ORDER — HEPARIN SODIUM (PORCINE) 1000 UNIT/ML IJ SOLN
INTRAMUSCULAR | Status: AC | PRN
Start: 2021-03-18 — End: 2021-03-18
  Administered 2021-03-18: 1000 [IU] via INTRAVENOUS

## 2021-03-18 MED ORDER — MIDAZOLAM HCL 2 MG/2ML IJ SOLN
INTRAMUSCULAR | Status: AC | PRN
  Administered 2021-03-18: 1 mg via INTRAVENOUS

## 2021-03-18 MED ORDER — SODIUM CHLORIDE 0.9 % IV SOLN: INTRAVENOUS | Status: AC

## 2021-03-18 MED ORDER — LIDOCAINE HCL 1 % IJ SOLN
INTRAMUSCULAR | Status: AC
  Administered 2021-03-18: 10 mL
  Filled 2021-03-18: qty 20

## 2021-03-18 NOTE — Sedation Documentation (Signed)
Right femoral sheath removed, 31fr exoseal closure device deployed to right femoral artery site at 0957 by IR tech.  ?

## 2021-03-18 NOTE — Procedures (Signed)
Four-vessel cerebral arteriogram. ? ?Right common femoral artery approach. ? ?Findings. ? ?1.  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 ? ?2.  3.25 mm x 2.4 mm right middle cerebral artery bifurcation aneurysm.. ? ?No acute complications. ? ?S.  Maleni Seyer MD ?

## 2021-03-23 ENCOUNTER — Telehealth: Payer: Self-pay | Admitting: Pulmonary Disease

## 2021-03-23 ENCOUNTER — Other Ambulatory Visit: Payer: Self-pay

## 2021-03-23 ENCOUNTER — Ambulatory Visit (HOSPITAL_COMMUNITY)
Admission: RE | Admit: 2021-03-23 | Discharge: 2021-03-23 | Disposition: A | Discharge status: Home / Self Care | Payer: Medicare PPO | Source: Ambulatory Visit | Attending: Interventional Radiology | Admitting: Interventional Radiology

## 2021-03-23 DIAGNOSIS — I671 Cerebral aneurysm, nonruptured: Secondary | ICD-10-CM | POA: Diagnosis not present

## 2021-03-23 DIAGNOSIS — R930 Abnormal findings on diagnostic imaging of skull and head, not elsewhere classified: Secondary | ICD-10-CM

## 2021-03-24 NOTE — Telephone Encounter (Signed)
Dr Loanne Drilling please advise: ? ? ? ?Patient is supposed to have a procedure done in april and doctor doing procedure wants to know if she needs to follow up with Dr. Loanne Drilling before since she will be put to sleep.  ?

## 2021-03-24 NOTE — Telephone Encounter (Signed)
From a pulmonary standpoint, patient is at low/average risk for post-op pulmonary complications. She is a never smoker with no known underlying lung disease. Does not need to be seen again in clinic as her last visit was 03/12/21. ? ? ?Peri-operative Assessment of Pulmonary Risk for Non-Thoracic Surgery: ? ?For Virginia Lindsey, risk of perioperative pulmonary complications is increased by: ? [ X ]Age greater than 65 years ? [    ]COPD ? [    ]Serum albumin <3.5 ? [    ]Smoking ? [    ]Obstructive sleep apnea ? [    ]NYHA Class II Pulmonary Hypertension ? ?Respiratory complications generally occur in 1% of ASA Class I patients, 5% of ASA Class II and 10% of ASA Class III-IV patients These complications rarely result in mortality and include postoperative pneumonia, atelectasis, pulmonary embolism, ARDS and increased time requiring postoperative mechanical ventilation. ? ?Overall, I recommend proceeding with the surgery if the risk for respiratory complications are outweighed by the potential benefits. This will need to be discussed between the patient and surgeon. ? ?To reduce risks of respiratory complications, I recommend: ?--Pre- and post-operative incentive spirometry performed frequently while awake ? ? ?Rodman Pickle, M.D. ?[facility] ?03/24/2021 3:12 PM  ? ? ?

## 2021-03-25 HISTORY — PX: IR RADIOLOGIST EVAL & MGMT: IMG5224

## 2021-03-26 ENCOUNTER — Other Ambulatory Visit (HOSPITAL_COMMUNITY): Payer: Self-pay | Admitting: Interventional Radiology

## 2021-03-26 DIAGNOSIS — I671 Cerebral aneurysm, nonruptured: Secondary | ICD-10-CM

## 2021-04-01 ENCOUNTER — Other Ambulatory Visit: Payer: Self-pay | Admitting: Radiology

## 2021-04-01 ENCOUNTER — Encounter (HOSPITAL_COMMUNITY): Payer: Self-pay | Admitting: Interventional Radiology

## 2021-04-01 NOTE — Progress Notes (Signed)
PCP - Dr. Moreen Fowler ?Cardiologist - denies - pt does complain of an occasional feeling of irregular HR that usually goes away after taking deep breaths. Pt says PCP is aware but has not addressed it with her any time she does mention it.  ?EKG - 03/04/21 ?Chest x-ray - 03/03/21 ?ECHO -  ?Cardiac Cath -  ?CPAP -  "years and years ago...maybe over 10 years ago, but I don't wear a machine, so I don't think I really had it"  ? ?Blood Thinner Instructions: Follow your surgeon's instructions on when to stop Aspirin and ticagrelor (BRILINTA).  If no instructions were given by your surgeon then you will need to call the office to get those instructions.   ?Per pt, she needed to call to clarify regimen of ASA and Brilinta. She was given ASA and Brilinta to start on the 29th, but is aware and planning to call to ask about if she needs to take DOS ?Aspirin Instructions:  ?ERAS Protcol -  ?COVID TEST- n/a ? ?Anesthesia review: n/a ? ?------------- ? ?SDW INSTRUCTIONS: ? ?Your procedure is scheduled on Monday 4/3. Please report to [facility] Main Entrance "A" at 0600 A.M., and check in at the Admitting office. Call this number if you have problems the morning of surgery: [phone] ? ? ?Remember: Do not eat or drink after midnight the night before your surgery ?  ?Medications to take morning of surgery with a sip of water include: ?albuterol (VENTOLIN HFA) --- Please bring all inhalers with you the day of surgery.  ?letrozole [facility])  ? ?Follow your surgeon's instructions on when to stop Aspirin and ticagrelor (BRILINTA).  If no instructions were given by your surgeon then you will need to call the office to get those instructions.   ? ? ?As of today, STOP taking any Aleve, Naproxen, Ibuprofen, Motrin, Advil, Goody's, BC's, all herbal medications, fish oil, and all vitamins. ? ?  ?The Morning of Surgery ?Do not wear jewelry, make-up or nail polish. ?Do not wear lotions, powders, or perfumes, or deodorant ?Do not bring valuables to  the hospital. ?[facility] is not responsible for any belongings or valuables. ? ?If you are a smoker, DO NOT Smoke 24 hours prior to surgery ? ?If you wear a CPAP at night please bring your mask the morning of surgery  ? ?Remember that you must have someone to transport you home after your surgery, and remain with you for 24 hours if you are discharged the same day. ? ?Please bring cases for contacts, glasses, hearing aids, dentures or bridgework because it cannot be worn into surgery.  ? ?Patients discharged the day of surgery will not be allowed to drive home.  ? ?Please shower the NIGHT BEFORE/MORNING OF SURGERY (use antibacterial soap like DIAL soap if possible). Wear comfortable clothes the morning of surgery. Oral Hygiene is also important to reduce your risk of infection.  Remember - BRUSH YOUR TEETH THE MORNING OF SURGERY WITH YOUR REGULAR TOOTHPASTE ? ?Patient denies shortness of breath, fever, cough and chest pain.  ? ? ?   ? ?

## 2021-04-05 ENCOUNTER — Inpatient Hospital Stay (HOSPITAL_COMMUNITY)
Admission: RE | Admit: 2021-04-05 | Discharge: 2021-04-05 | Disposition: A | Discharge status: Home / Self Care | Payer: Medicare PPO | Source: Ambulatory Visit | Attending: Interventional Radiology | Admitting: Interventional Radiology

## 2021-04-05 ENCOUNTER — Inpatient Hospital Stay (HOSPITAL_COMMUNITY): Payer: Medicare PPO | Admitting: Vascular Surgery

## 2021-04-05 ENCOUNTER — Other Ambulatory Visit: Payer: Self-pay

## 2021-04-05 ENCOUNTER — Inpatient Hospital Stay (HOSPITAL_COMMUNITY)
Admission: RE | Admit: 2021-04-05 | Discharge: 2021-04-06 | DRG: 027 | Disposition: A | Discharge status: Home / Self Care | Payer: Medicare PPO | Attending: Interventional Radiology | Admitting: Interventional Radiology

## 2021-04-05 ENCOUNTER — Encounter (HOSPITAL_COMMUNITY): Payer: Self-pay

## 2021-04-05 ENCOUNTER — Encounter (HOSPITAL_COMMUNITY): Payer: Self-pay | Admitting: Interventional Radiology

## 2021-04-05 ENCOUNTER — Encounter (HOSPITAL_COMMUNITY)
Admission: RE | Disposition: A | Discharge status: Home / Self Care | Payer: Self-pay | Source: Home / Self Care | Attending: Interventional Radiology

## 2021-04-05 DIAGNOSIS — Z9049 Acquired absence of other specified parts of digestive tract: Secondary | ICD-10-CM

## 2021-04-05 DIAGNOSIS — Z7982 Long term (current) use of aspirin: Secondary | ICD-10-CM

## 2021-04-05 DIAGNOSIS — R911 Solitary pulmonary nodule: Secondary | ICD-10-CM | POA: Diagnosis not present

## 2021-04-05 DIAGNOSIS — J42 Unspecified chronic bronchitis: Secondary | ICD-10-CM | POA: Diagnosis present

## 2021-04-05 DIAGNOSIS — Z885 Allergy status to narcotic agent status: Secondary | ICD-10-CM | POA: Diagnosis not present

## 2021-04-05 DIAGNOSIS — Z90711 Acquired absence of uterus with remaining cervical stump: Secondary | ICD-10-CM

## 2021-04-05 DIAGNOSIS — I671 Cerebral aneurysm, nonruptured: Secondary | ICD-10-CM | POA: Diagnosis not present

## 2021-04-05 DIAGNOSIS — Z79899 Other long term (current) drug therapy: Secondary | ICD-10-CM | POA: Diagnosis not present

## 2021-04-05 DIAGNOSIS — Z91040 Latex allergy status: Secondary | ICD-10-CM | POA: Diagnosis not present

## 2021-04-05 DIAGNOSIS — Z923 Personal history of irradiation: Secondary | ICD-10-CM | POA: Diagnosis not present

## 2021-04-05 DIAGNOSIS — Z803 Family history of malignant neoplasm of breast: Secondary | ICD-10-CM | POA: Diagnosis not present

## 2021-04-05 DIAGNOSIS — R569 Unspecified convulsions: Secondary | ICD-10-CM

## 2021-04-05 DIAGNOSIS — Z85528 Personal history of other malignant neoplasm of kidney: Secondary | ICD-10-CM | POA: Diagnosis not present

## 2021-04-05 DIAGNOSIS — Z853 Personal history of malignant neoplasm of breast: Secondary | ICD-10-CM | POA: Diagnosis not present

## 2021-04-05 DIAGNOSIS — Z8051 Family history of malignant neoplasm of kidney: Secondary | ICD-10-CM | POA: Diagnosis not present

## 2021-04-05 HISTORY — PX: IR ANGIO INTRA EXTRACRAN SEL INTERNAL CAROTID UNI L MOD SED: IMG5361

## 2021-04-05 HISTORY — PX: IR NEURO EACH ADD'L AFTER BASIC UNI LEFT (MS): IMG5373

## 2021-04-05 HISTORY — PX: IR 3D INDEPENDENT WKST: IMG2385

## 2021-04-05 HISTORY — PX: IR ANGIOGRAM FOLLOW UP STUDY: IMG697

## 2021-04-05 HISTORY — PX: RADIOLOGY WITH ANESTHESIA: SHX6223

## 2021-04-05 HISTORY — PX: IR TRANSCATH/EMBOLIZ: IMG695

## 2021-04-05 HISTORY — PX: IR CT HEAD LTD: IMG2386

## 2021-04-05 LAB — MRSA NEXT GEN BY PCR, NASAL: MRSA by PCR Next Gen: NOT DETECTED

## 2021-04-05 LAB — CBC WITH DIFFERENTIAL/PLATELET
Abs Immature Granulocytes: 0.04 10*3/uL (ref 0.00–0.07)
Basophils Absolute: 0 10*3/uL (ref 0.0–0.1)
Basophils Relative: 1 %
Eosinophils Absolute: 0.1 10*3/uL (ref 0.0–0.5)
Eosinophils Relative: 2 %
HCT: 38.5 % (ref 36.0–46.0)
Hemoglobin: 12.8 g/dL (ref 12.0–15.0)
Immature Granulocytes: 1 %
Lymphocytes Relative: 24 %
Lymphs Abs: 1.6 10*3/uL (ref 0.7–4.0)
MCH: 31.1 pg (ref 26.0–34.0)
MCHC: 33.2 g/dL (ref 30.0–36.0)
MCV: 93.4 fL (ref 80.0–100.0)
Monocytes Absolute: 0.6 10*3/uL (ref 0.1–1.0)
Monocytes Relative: 10 %
Neutro Abs: 4 10*3/uL (ref 1.7–7.7)
Neutrophils Relative %: 62 %
Platelets: 314 10*3/uL (ref 150–400)
RBC: 4.12 MIL/uL (ref 3.87–5.11)
RDW: 13 % (ref 11.5–15.5)
WBC: 6.4 10*3/uL (ref 4.0–10.5)
nRBC: 0 % (ref 0.0–0.2)

## 2021-04-05 LAB — BASIC METABOLIC PANEL
Anion gap: 11 (ref 5–15)
BUN: 12 mg/dL (ref 8–23)
CO2: 22 mmol/L (ref 22–32)
Calcium: 9.1 mg/dL (ref 8.9–10.3)
Chloride: 108 mmol/L (ref 98–111)
Creatinine, Ser: 0.91 mg/dL (ref 0.44–1.00)
GFR, Estimated: >60 mL/min (ref >60)
Glucose, Bld: 94 mg/dL (ref 70–99)
Potassium: 3.3 mmol/L — ABNORMAL LOW (ref 3.5–5.1)
Sodium: 141 mmol/L (ref 135–145)

## 2021-04-05 LAB — PROTIME-INR
INR: 1 (ref 0.8–1.2)
Prothrombin Time: 13.2 seconds (ref 11.4–15.2)

## 2021-04-05 LAB — HEPARIN LEVEL (UNFRACTIONATED): Heparin Unfractionated: <0.1 IU/mL — ABNORMAL LOW (ref 0.30–0.70)

## 2021-04-05 LAB — POCT ACTIVATED CLOTTING TIME
Activated Clotting Time: 221 seconds
Activated Clotting Time: 233 seconds

## 2021-04-05 SURGERY — IR WITH ANESTHESIA
Anesthesia: General

## 2021-04-05 MED ORDER — ASPIRIN 81 MG PO CHEW: 81.0000 mg | CHEWABLE_TABLET | Freq: Once | ORAL | Status: AC

## 2021-04-05 MED ORDER — TICAGRELOR 90 MG PO TABS
90.0000 mg | ORAL_TABLET | Freq: Two times a day (BID) | ORAL | Status: DC
  Administered 2021-04-05 – 2021-04-06 (×2): 90 mg via ORAL
  Filled 2021-04-05 (×2): qty 1

## 2021-04-05 MED ORDER — LIDOCAINE 2% (20 MG/ML) 5 ML SYRINGE
INTRAMUSCULAR | Status: DC | PRN
  Administered 2021-04-05: 60 mg via INTRAVENOUS

## 2021-04-05 MED ORDER — CHLORHEXIDINE GLUCONATE 0.12 % MT SOLN
15.0000 mL | Freq: Once | OROMUCOSAL | Status: AC
  Administered 2021-04-05: 15 mL via OROMUCOSAL
  Filled 2021-04-05: qty 15

## 2021-04-05 MED ORDER — ASPIRIN 81 MG PO CHEW
CHEWABLE_TABLET | ORAL | Status: AC
  Administered 2021-04-05: 81 mg via ORAL
  Filled 2021-04-05: qty 1

## 2021-04-05 MED ORDER — SODIUM CHLORIDE 0.9 % IV SOLN
INTRAVENOUS | Status: DC
Start: 2021-04-05 — End: 2021-04-06

## 2021-04-05 MED ORDER — SODIUM CHLORIDE 0.9 % IV SOLN: INTRAVENOUS | Status: DC | PRN

## 2021-04-05 MED ORDER — HEPARIN (PORCINE) 25000 UT/250ML-% IV SOLN
500.0000 [IU]/h | INTRAVENOUS | Status: DC
  Administered 2021-04-05: 500 [IU]/h via INTRAVENOUS

## 2021-04-05 MED ORDER — FENTANYL CITRATE (PF) 100 MCG/2ML IJ SOLN
INTRAMUSCULAR | Status: AC
  Filled 2021-04-05: qty 2

## 2021-04-05 MED ORDER — CHLORHEXIDINE GLUCONATE CLOTH 2 % EX PADS
6.0000 | MEDICATED_PAD | Freq: Every day | CUTANEOUS | Status: DC
  Administered 2021-04-05 – 2021-04-06 (×2): 6 via TOPICAL

## 2021-04-05 MED ORDER — FENTANYL CITRATE (PF) 100 MCG/2ML IJ SOLN
25.0000 ug | INTRAMUSCULAR | Status: DC | PRN
  Administered 2021-04-05: 50 ug via INTRAVENOUS
  Administered 2021-04-05 (×2): 25 ug via INTRAVENOUS

## 2021-04-05 MED ORDER — ROCURONIUM BROMIDE 10 MG/ML (PF) SYRINGE
PREFILLED_SYRINGE | INTRAVENOUS | Status: DC | PRN
  Administered 2021-04-05: 100 mg via INTRAVENOUS
  Administered 2021-04-05: 20 mg via INTRAVENOUS

## 2021-04-05 MED ORDER — PROPOFOL 10 MG/ML IV BOLUS
INTRAVENOUS | Status: DC | PRN
Start: 2021-04-05 — End: 2021-04-05
  Administered 2021-04-05: 150 mg via INTRAVENOUS

## 2021-04-05 MED ORDER — ONDANSETRON HCL 4 MG/2ML IJ SOLN: 4.0000 mg | Freq: Once | INTRAMUSCULAR | Status: DC | PRN

## 2021-04-05 MED ORDER — CLEVIDIPINE BUTYRATE 0.5 MG/ML IV EMUL
0.0000 mg/h | INTRAVENOUS | Status: DC
  Administered 2021-04-05 (×2): 8 mg/h via INTRAVENOUS
  Administered 2021-04-06: 10 mg/h via INTRAVENOUS
  Filled 2021-04-05 (×2): qty 100
  Filled 2021-04-05: qty 50
  Filled 2021-04-05: qty 100

## 2021-04-05 MED ORDER — IOHEXOL 300 MG/ML  SOLN
100.0000 mL | Freq: Once | INTRAMUSCULAR | Status: AC | PRN
  Administered 2021-04-05: 50 mL via INTRA_ARTERIAL

## 2021-04-05 MED ORDER — AMISULPRIDE (ANTIEMETIC) 5 MG/2ML IV SOLN: 10.0000 mg | Freq: Once | INTRAVENOUS | Status: DC | PRN

## 2021-04-05 MED ORDER — TICAGRELOR 90 MG PO TABS: 90.0000 mg | ORAL_TABLET | Freq: Two times a day (BID) | ORAL | Status: DC

## 2021-04-05 MED ORDER — NIMODIPINE 30 MG PO CAPS: 0.0000 mg | ORAL_CAPSULE | ORAL | Status: DC

## 2021-04-05 MED ORDER — PHENYLEPHRINE HCL-NACL 20-0.9 MG/250ML-% IV SOLN
INTRAVENOUS | Status: DC | PRN
  Administered 2021-04-05: 50 ug/min via INTRAVENOUS

## 2021-04-05 MED ORDER — ASPIRIN 81 MG PO CHEW: 81.0000 mg | CHEWABLE_TABLET | Freq: Every day | ORAL | Status: DC

## 2021-04-05 MED ORDER — HEPARIN (PORCINE) 25000 UT/250ML-% IV SOLN
INTRAVENOUS | Status: AC
  Filled 2021-04-05: qty 250

## 2021-04-05 MED ORDER — IOHEXOL 300 MG/ML  SOLN
100.0000 mL | Freq: Once | INTRAMUSCULAR | Status: AC | PRN
Start: 2021-04-05 — End: 2021-04-05
  Administered 2021-04-05: 50 mL via INTRA_ARTERIAL

## 2021-04-05 MED ORDER — SODIUM CHLORIDE 0.9 % IV SOLN: INTRAVENOUS | Status: DC

## 2021-04-05 MED ORDER — FENTANYL CITRATE (PF) 250 MCG/5ML IJ SOLN
INTRAMUSCULAR | Status: DC | PRN
  Administered 2021-04-05 (×2): 50 ug via INTRAVENOUS

## 2021-04-05 MED ORDER — ACETAMINOPHEN 650 MG RE SUPP: 650.0000 mg | RECTAL | Status: DC | PRN

## 2021-04-05 MED ORDER — CLEVIDIPINE BUTYRATE 0.5 MG/ML IV EMUL
INTRAVENOUS | Status: DC | PRN
  Administered 2021-04-05: 4 mg/h via INTRAVENOUS

## 2021-04-05 MED ORDER — KETOROLAC TROMETHAMINE 30 MG/ML IJ SOLN
30.0000 mg | Freq: Three times a day (TID) | INTRAMUSCULAR | Status: DC | PRN
  Administered 2021-04-05: 30 mg via INTRAVENOUS
  Filled 2021-04-05: qty 1

## 2021-04-05 MED ORDER — PHENYLEPHRINE HCL-NACL 20-0.9 MG/250ML-% IV SOLN: INTRAVENOUS | Status: DC | PRN

## 2021-04-05 MED ORDER — HEPARIN SODIUM (PORCINE) 1000 UNIT/ML IJ SOLN
INTRAMUSCULAR | Status: DC | PRN
  Administered 2021-04-05: 1000 [IU] via INTRAVENOUS
  Administered 2021-04-05: 3000 [IU] via INTRAVENOUS

## 2021-04-05 MED ORDER — TICAGRELOR 90 MG PO TABS: 90.0000 mg | ORAL_TABLET | ORAL | Status: DC

## 2021-04-05 MED ORDER — ACETAMINOPHEN 160 MG/5ML PO SOLN: 650.0000 mg | ORAL | Status: DC | PRN

## 2021-04-05 MED ORDER — NITROGLYCERIN 1 MG/10 ML FOR IR/CATH LAB
INTRA_ARTERIAL | Status: AC
  Filled 2021-04-05: qty 10

## 2021-04-05 MED ORDER — OXYCODONE HCL 5 MG PO TABS: 5.0000 mg | ORAL_TABLET | Freq: Once | ORAL | Status: DC | PRN

## 2021-04-05 MED ORDER — ORAL CARE MOUTH RINSE: 15.0000 mL | Freq: Once | OROMUCOSAL | Status: AC

## 2021-04-05 MED ORDER — HEPARIN (PORCINE) 25000 UT/250ML-% IV SOLN
700.0000 [IU]/h | INTRAVENOUS | Status: DC
Start: 2021-04-05 — End: 2021-04-06

## 2021-04-05 MED ORDER — ASPIRIN 81 MG PO CHEW
81.0000 mg | CHEWABLE_TABLET | Freq: Every day | ORAL | Status: DC
Start: 2021-04-06 — End: 2021-04-06
  Administered 2021-04-06: 81 mg via ORAL
  Filled 2021-04-05: qty 1

## 2021-04-05 MED ORDER — PHENYLEPHRINE 40 MCG/ML (10ML) SYRINGE FOR IV PUSH (FOR BLOOD PRESSURE SUPPORT)
PREFILLED_SYRINGE | INTRAVENOUS | Status: DC | PRN
  Administered 2021-04-05: 40 ug via INTRAVENOUS
  Administered 2021-04-05 (×2): 80 ug via INTRAVENOUS

## 2021-04-05 MED ORDER — CEFAZOLIN SODIUM-DEXTROSE 2-4 GM/100ML-% IV SOLN
2.0000 g | INTRAVENOUS | Status: AC
  Administered 2021-04-05: 2 g via INTRAVENOUS
  Filled 2021-04-05: qty 100

## 2021-04-05 MED ORDER — ONDANSETRON HCL 4 MG/2ML IJ SOLN
INTRAMUSCULAR | Status: DC | PRN
  Administered 2021-04-05: 4 mg via INTRAVENOUS

## 2021-04-05 MED ORDER — EPTIFIBATIDE 20 MG/10ML IV SOLN
INTRAVENOUS | Status: AC | PRN
  Administered 2021-04-05 (×3): 1.5 mg via INTRA_ARTERIAL

## 2021-04-05 MED ORDER — ACETAMINOPHEN 325 MG PO TABS
650.0000 mg | ORAL_TABLET | ORAL | Status: DC | PRN
  Administered 2021-04-05 (×2): 650 mg via ORAL
  Filled 2021-04-05 (×2): qty 2

## 2021-04-05 MED ORDER — SUGAMMADEX SODIUM 200 MG/2ML IV SOLN
INTRAVENOUS | Status: DC | PRN
  Administered 2021-04-05: 200 mg via INTRAVENOUS

## 2021-04-05 MED ORDER — CLEVIDIPINE BUTYRATE 0.5 MG/ML IV EMUL
INTRAVENOUS | Status: AC
  Filled 2021-04-05: qty 50

## 2021-04-05 MED ORDER — LACTATED RINGERS IV SOLN: INTRAVENOUS | Status: DC

## 2021-04-05 MED ORDER — EPTIFIBATIDE 20 MG/10ML IV SOLN
INTRAVENOUS | Status: AC
  Filled 2021-04-05: qty 10

## 2021-04-05 MED ORDER — DEXAMETHASONE SODIUM PHOSPHATE 10 MG/ML IJ SOLN
INTRAMUSCULAR | Status: DC | PRN
  Administered 2021-04-05: 5 mg via INTRAVENOUS

## 2021-04-05 MED ORDER — OXYCODONE HCL 5 MG/5ML PO SOLN: 5.0000 mg | Freq: Once | ORAL | Status: DC | PRN

## 2021-04-05 NOTE — Anesthesia Procedure Notes (Signed)
Arterial Line Insertion ?Start/End4/03/2021 8:15 PM, 04/05/2021 8:30 PM ?Performed by: Imagene Riches, CRNA, CRNA ? Patient location: Pre-op. ?Preanesthetic checklist: patient identified, IV checked, site marked, risks and benefits discussed, surgical consent, monitors and equipment checked, pre-op evaluation, timeout performed and anesthesia consent ?Left, radial was placed ?Catheter size: 20 G ?Hand hygiene performed  and maximum sterile barriers used  ?Allen's test indicative of satisfactory collateral circulation ?Attempts: 1 ?Procedure performed without using ultrasound guided technique. ?Following insertion, dressing applied and Biopatch. ?Post procedure assessment: normal ? ?Patient tolerated the procedure well with no immediate complications. ? ? ?

## 2021-04-05 NOTE — Progress Notes (Addendum)
Call received from Brynda Greathouse, PA with Dr. Estanislado Pandy. Informed her that patient took Brilinta at [phone] this morning but did not take her Aspirin because she took an extra dose yesterday evening at 1700. Ok to hold today's dose of Aspirin per K. Zigmund Daniel, PA.  ?
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
## 2021-04-05 NOTE — Progress Notes (Signed)
Verbal order received from Brynda Greathouse, PA to administer Aspirin 81 mg to patient.  ?

## 2021-04-05 NOTE — Progress Notes (Signed)
?  Transition of Care (TOC) Screening Note ? ? ?Patient Details  ?Name: Virginia Lindsey ?Date of Birth: 1950-08-14 ? ? ?Transition of Care (TOC) CM/SW Contact:    ?Benard Halsted, LCSW ?Phone Number: ?04/05/2021, 4:47 PM ? ? ? ?Transition of Care Department [facility]) has reviewed patient and no TOC needs have been identified at this time. We will continue to monitor patient advancement through interdisciplinary progression rounds. If new patient transition needs arise, please place a TOC consult. ? ? ?

## 2021-04-05 NOTE — H&P (Signed)
? ?Chief Complaint: ?Patient was seen in consultation today for L MCA aneurysm ? ?Supervising Physician: Luanne Bras ? ?Patient Status: [facility] - Out-pt ? ?History of Present Illness: ?Virginia Lindsey is a 71 y.o. 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  She has met with Dr. DeEstanislado Pandyn consultation to discuss management and treatment of her aneurysm and has elected to proceed.  She does follow with Pulmonology for history of sleep apna, RUL lung nodule, chronic bronchitis, and has been deemed appropriate for anesthesia today.  ? ?Ms. TuPiesresents today in her usual state of health.  She has been NPO.  She took Brilinta this AM, but held her aspirin due to accidentally taking 2 doses yesterday.  She is otherwise unchanged and denies new neuro-related symptoms.   ? ?Past Medical History:  ?Diagnosis Date  ? Back pain   ? [facility] ([facility]  ? Family history of breast cancer   ? Family history of melanoma   ? Family history of renal cancer   ? Personal history of radiation therapy   ? PONV (postoperative nausea and vomiting)   ? [place]  ? [place]  3 total 10 yrs apart  ? Wears glasses   ? ? ?Past Surgical History:  ?Procedure Laterality Date  ? ABDOMINAL HYSTERECTOMY  1991  ? partial hysterectomy  ? BREAST BIOPSY Left 10/29/2012  ? Procedure:  LEFT BREAST WIRE GUIDED EXCISION;  Surgeon: MaRolm BookbinderMD;  [facility];  Laterality: Left;  ? BREAST EXCISIONAL BIOPSY    ? BREAST LUMPECTOMY Left 12/19/2016  ? BREAST LUMPECTOMY WITH RADIOACTIVE SEED AND SENTINEL LYMPH NODE BIOPSY Left 12/19/2016  ? Procedure: LEFT BREAST LUMPECTOMY WITH RADIOACTIVE SEED AND LEFT AXILLARY SENTINEL LYMPH NODE BIOPSY;  Surgeon: WaRolm BookbinderMD;  [place] [facility];  Laterality: Left;  ? BREAST SURGERY  2013  ? biopsy-lt  ?  CHOLECYSTECTOMY    ? COLONOSCOPY    ? IR 3D INDEPENDENT WKST  03/18/2021  ? IR ANGIO INTRA EXTRACRAN SEL COM CAROTID INNOMINATE BILAT MOD SED  03/18/2021  ? IR ANGIO VERTEBRAL SEL VERTEBRAL BILAT MOD SED  03/18/2021  ? IR RADIOLOGIST EVAL & MGMT  03/15/2021  ? IR RADIOLOGIST EVAL & MGMT  03/25/2021  ? LUMBAR LAMINECTOMY/DECOMPRESSION MICRODISCECTOMY Right 08/27/2012  ? Procedure: RIGHT  LUMBAR FOUR FIVE LAMINECTOMY/DECOMPRESSION MICRODISCECTOMY 1 LEVEL;  Surgeon: GaElaina HoopsMD;  [facility];  Service: Neurosurgery;  Laterality: Right;  ? UPPER GI ENDOSCOPY    ? multiple endoscopy's  ? ? ?Allergies: ?Codeine, Hydrocodone, and Latex ? ?Medications: ?Prior to Admission medications   ?Medication Sig Start Date End Date Taking? Authorizing Provider  ?albuterol (VENTOLIN HFA) 108 (90 Base) MCG/ACT inhaler Inhale 2 puffs into the lungs every 6 (six) hours as needed for wheezing or shortness of breath.    [provider]  ?aspirin EC 81 MG tablet Take 81 mg by mouth daily. Swallow whole.    [provider]  ?cholecalciferol (VITAMIN D3) 25 MCG (1000 UNIT) tablet Take 1 tablet (1,000 Units total) by mouth daily. 08/25/20   GuNicholas LoseMD  ?letrozole ([facility] MG tablet TAKE 1 TABLET BY MOUTH EVERY DAY 05/28/20   GuNicholas LoseMD  ?ticagrelor (BRILINTA) 90 MG TABS tablet Take 90 mg by mouth 2 (two) times daily.    [provider]  ?  ? ?  Family History  ?Problem Relation Age of Onset  ? Asthma Mother   ? Cancer Mother   ?     renal cell carcinoma, dx in her 89s, d. 66  ? Cancer Father 66  ?     melanoma on face; d. 62  ? Diabetes Maternal Grandmother   ? Breast cancer Sister 75  ?     d. 13  ? Breast cancer Paternal Aunt 40  ?     dbl mastectomy  ? Cancer Paternal Uncle   ?     NOS  ? Melanoma Paternal Aunt   ? Breast cancer Paternal Aunt   ? Melanoma Cousin   ?     pat first cousin; daughter of aunt with melanoma  ? Breast cancer Cousin   ? Lymphoma Son 4  ?     [place] Lymphoma   ? ? ?Social History  ? ?Socioeconomic History  ? Marital status: Married  ?  Spouse name: Not on file  ? Number of children: 4  ? Years of education: Not on file  ? Highest education level: Not on file  ?Occupational History  ? Not on file  ?Tobacco Use  ? Smoking status: Never  ? Smokeless tobacco: Never  ?Vaping Use  ? Vaping Use: Never used  ?Substance and Sexual Activity  ? Alcohol use: No  ? Drug use: No  ? Sexual activity: Not on file  ?Other Topics Concern  ? Not on file  ?Social History Narrative  ? Not on file  ? ?Social Determinants of Health  ? ?Financial Resource Strain: Not on file  ?Food Insecurity: Not on file  ?Transportation Needs: Not on file  ?Physical Activity: Not on file  ?Stress: Not on file  ?Social Connections: Not on file  ? ? ? ?Review of Systems: A 12 point ROS discussed and pertinent positives are indicated in the HPI above.  All other systems are negative. ? ?Review of Systems  ?Constitutional:  Negative for fatigue and fever.  ?Respiratory:  Negative for cough and shortness of breath.   ?Cardiovascular:  Negative for chest pain.  ?Gastrointestinal:  Negative for abdominal pain.  ?Musculoskeletal:  Negative for back pain.  ?Neurological:  Negative for dizziness, facial asymmetry, weakness, light-headedness, numbness and headaches.  ?Psychiatric/Behavioral:  Negative for behavioral problems and confusion.   ? ?Vital Signs: ?There were no vitals taken for this visit. ? ?Physical Exam ?Vitals and nursing note reviewed.  ?Constitutional:   ?   General: She is not in acute distress. ?   Appearance: Normal appearance. She is not ill-appearing.  ?HENT:  ?   Mouth/Throat:  ?   Mouth: Mucous membranes are moist.  ?   Pharynx: Oropharynx is clear.  ?Cardiovascular:  ?   Rate and Rhythm: Normal rate and regular rhythm.  ?Pulmonary:  ?   Effort: Pulmonary effort is normal.  ?Abdominal:  ?   General: Abdomen is flat.  ?   Palpations: Abdomen is soft.  ?Skin: ?   General: Skin is warm and dry.   ?Neurological:  ?   General: No focal deficit present.  ?   Mental Status: She is alert and oriented to person, place, and time.  ?Psychiatric:     ?   Mood and Affect: Mood normal.     ?   Behavior: Behavior normal.     ?   Thought Content: Thought content normal.     ?   Judgment: Judgment normal.  ? ? ? ?  MD Evaluation ?Airway: WNL ?Heart: WNL ?Abdomen: WNL ?Chest/ Lungs: WNL ?ASA  Classification: 3 ?Mallampati/Airway Score: Two ? ? ?Imaging: ?IR 3D Independent Wkst ? ?Result Date: 03/23/2021 ?CLINICAL DATA:  Patient recently discovered to have two unruptured intracranial aneurysms. EXAM: BILATERAL COMMON CAROTID AND INNOMINATE ANGIOGRAPHY COMPARISON:  CTA of the head and neck of 03/03/2021. MEDICATIONS: Heparin 1000 units IV. No antibiotic was administered within 1 hour of the procedure. ANESTHESIA/SEDATION: Versed 1 mg IV; Fentanyl 25 mcg IV Moderate Sedation Time:  49 minutes The patient was continuously monitored during the procedure by the interventional radiology nurse under my direct supervision. CONTRAST:  Omnipaque 300 approximately 90 mL. FLUOROSCOPY TIME:  Fluoroscopy Time: 10 minutes 18 seconds (1012 mGy). COMPLICATIONS: None immediate. TECHNIQUE: Informed written consent was obtained from the patient after a thorough discussion of the procedural risks, benefits and alternatives. All questions were addressed. Maximal Sterile Barrier Technique was utilized including caps, mask, sterile gowns, sterile gloves, sterile drape, hand hygiene and skin antiseptic. A timeout was performed prior to the initiation of the procedure. The right groin was prepped and draped in the usual sterile fashion. Thereafter using modified Seldinger technique, transfemoral access into the right common femoral artery was obtained without difficulty. Over a 0.035 inch guidewire, a 5 French Pinnacle sheath was inserted. Through this, and also over 0.035 inch guidewire, a 5 [place] JB 1 catheter was advanced to the aortic arch region  and selectively positioned in the right common carotid artery, the left common carotid artery, the right vertebral artery and the left vertebral artery. A 3D rotational arteriogram was performed of the left anterior circul

## 2021-04-05 NOTE — Anesthesia Postprocedure Evaluation (Signed)
Anesthesia Post Note ? ?Patient: Virginia Lindsey ? ?Procedure(s) Performed: EMBOLIZATION ? ?  ? ?Patient location during evaluation: PACU ?Anesthesia Type: General ?Level of consciousness: awake and alert, oriented and patient cooperative ?Pain management: pain level controlled ?Vital Signs Assessment: post-procedure vital signs reviewed and stable ?Respiratory status: spontaneous breathing, nonlabored ventilation and respiratory function stable ?Cardiovascular status: blood pressure returned to baseline and stable ?Postop Assessment: no apparent nausea or vomiting ?Anesthetic complications: no ? ? ?No notable events documented. ? ?Last Vitals:  ?Vitals:  ? 04/05/21 0647  ?BP: (!) 155/88  ?Pulse: 63  ?Resp: 17  ?Temp: 36.5 ?C  ?SpO2: 97%  ?  ?Last Pain:  ?Vitals:  ? 04/05/21 0718  ?PainSc: 0-No pain  ? ? ?  ?  ?  ?  ?  ?  ? ?Virginia Lindsey ? ? ? ? ?

## 2021-04-05 NOTE — Progress Notes (Signed)
ANTICOAGULATION CONSULT NOTE - Initial Consult ? ?Pharmacy Consult for Heparin Infusion ?Indication:  post-interventional neuroradiology procedure ? ?Allergies  ?Allergen Reactions  ? Codeine Nausea And Vomiting  ? Hydrocodone Nausea And Vomiting  ?  Also lightheaded and disoriented  ? Latex Rash  ? ? ?Patient Measurements: ?Height: 5' 3.5" (161.3 cm) ?Weight: 97.5 kg (215 lb) ?IBW/kg (Calculated) : 53.55 ?Heparin Dosing Weight: 76.1 kg ? ?Vital Signs: ?Temp: 97.3 ?F (36.3 ?C) (04/03 1310) ?BP: 130/74 (04/03 1355) ?Pulse Rate: 82 (04/03 1355) ? ?Labs: ?Recent Labs  ?  04/05/21 ?0717 04/05/21 ?0724  ?HGB  --  12.8  ?HCT  --  38.5  ?PLT  --  314  ?LABPROT 13.2  --   ?INR 1.0  --   ?CREATININE 0.91  --   ? ? ?Estimated Creatinine Clearance: 61.9 mL/min (by C-G formula based on SCr of 0.91 mg/dL). ? ? ?Medical History: ?Past Medical History:  ?Diagnosis Date  ? Back pain   ? Breast cancer ([place])   ? Family history of breast cancer   ? Family history of melanoma   ? Family history of renal cancer   ? Personal history of radiation therapy   ? PONV (postoperative nausea and vomiting)   ? Seizures ([place])   ? last seizure 2004  3 total 10 yrs apart  ? Wears glasses   ? ? ?Medications:  ?Medications Prior to Admission  ?Medication Sig Dispense Refill Last Dose  ? aspirin EC 81 MG tablet Take 81 mg by mouth daily. Swallow whole.   04/04/2021 at 1700  ? cholecalciferol (VITAMIN D3) 25 MCG (1000 UNIT) tablet Take 1 tablet (1,000 Units total) by mouth daily.   Past Week  ? letrozole (FEMARA) 2.5 MG tablet TAKE 1 TABLET BY MOUTH EVERY DAY 90 tablet 3 04/05/2021 at 0500  ? ticagrelor (BRILINTA) 90 MG TABS tablet Take 90 mg by mouth 2 (two) times daily.   04/05/2021 at 0650  ? albuterol (VENTOLIN HFA) 108 (90 Base) MCG/ACT inhaler Inhale 2 puffs into the lungs every 6 (six) hours as needed for wheezing or shortness of breath.   More than a month  ? ? ?Assessment: ?71 yo F admitted for IR interventional treatment of L MCA aneurysm on 04/05/21.  Pharmacy consulted to manage heparin infusion post neuroradiology intervention procedure.  ? ?H/H/Plt stable  ?Heparin infusion initiated at 500 units/hr at 13:15. ?Pt noted to have a bruised area to upper left lip with swelling.  ? ?Goal of Therapy:  ?Heparin level 0.1-0.25 ?Monitor platelets by anticoagulation protocol: Yes ?  ?Plan:  ?Continue heparin infusion at 500 units/hr ?Check heparin level at 2100.  ?Monitor CBC and heparin level daily. ?Monitor for s/sx of bleeding. ?Discontinue heparin at 08:00 on 4/4 for sheath removal (order entered).  ? ?Kaleen Mask ?04/05/2021,2:38 PM ? ? ?

## 2021-04-05 NOTE — Anesthesia Procedure Notes (Signed)
Procedure Name: Intubation ?Date/Time: 04/05/2021 8:55 AM ?Performed by: Imagene Riches, CRNA ?Pre-anesthesia Checklist: Patient identified, Emergency Drugs available, Suction available and Patient being monitored ?Patient Re-evaluated:Patient Re-evaluated prior to induction ?Oxygen Delivery Method: Circle System Utilized ?Preoxygenation: Pre-oxygenation with 100% oxygen ?Induction Type: IV induction ?Ventilation: Mask ventilation without difficulty ?Laryngoscope Size: Sabra Heck and 2 ?Grade View: Grade I ?Tube type: Oral ?Tube size: 7.0 mm ?Number of attempts: 1 ?Airway Equipment and Method: Stylet and Oral airway ?Placement Confirmation: ETT inserted through vocal cords under direct vision, positive ETCO2 and breath sounds checked- equal and bilateral ?Secured at: 22 cm ?Tube secured with: Tape ?Dental Injury: Teeth and Oropharynx as per pre-operative assessment  ? ? ? ? ?

## 2021-04-05 NOTE — H&P (Deleted)
  The note originally documented on this encounter has been moved the the encounter in which it belongs.  

## 2021-04-05 NOTE — Anesthesia Preprocedure Evaluation (Signed)
Anesthesia Evaluation  ?Patient identified by MRN, date of birth, ID band ?Patient awake ? ? ? ?Reviewed: ?Allergy & Precautions, NPO status , Patient's Chart, lab work & pertinent test results ? ?History of Anesthesia Complications ?Negative for: history of anesthetic complications ? ?Airway ?Mallampati: II ? ?TM Distance: >3 FB ?Neck ROM: Full ? ? ? Dental ? ?(+) Teeth Intact, Dental Advisory Given ?  ?Pulmonary ?neg pulmonary ROS,  ?  ?Pulmonary exam normal ?breath sounds clear to auscultation ? ? ? ? ? ? Cardiovascular ?negative cardio ROS ?Normal cardiovascular exam ?Rhythm:Regular Rate:Normal ? ? ?  ?Neuro/Psych ?Seizures -, Well Controlled,  1. 8 mm left MCA bifurcation aneurysm. A superior M2 branch arises ?from the superior aspect of the aneurysm, and an inferior M2 branch ?arises from the medial aspect of the aneurysm. ?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 ?negative psych ROS  ? GI/Hepatic ?negative GI ROS, Neg liver ROS,   ?Endo/Other  ?Obesity BMI 38 ? Renal/GU ?negative Renal ROS  ?negative genitourinary ?  ?Musculoskeletal ?negative musculoskeletal ROS ?(+)  ? Abdominal ?  ?Peds ?negative pediatric ROS ?(+)  Hematology ?negative hematology ROS ?(+)   ?Anesthesia Other Findings ? ? Reproductive/Obstetrics ?negative OB ROS ? ?  ? ? ? ? ? ? ? ? ? ? ? ? ? ?  ?  ? ? ? ? ? ? ? ? ?Anesthesia Physical ?Anesthesia Plan ? ?ASA: 3 ? ?Anesthesia Plan: General  ? ?Post-op Pain Management: Tylenol PO (pre-op)*  ? ?Induction: Intravenous ? ?PONV Risk Score and Plan: Ondansetron, Dexamethasone, Midazolam and Treatment may vary due to age or medical condition ? ?Airway Management Planned: Oral ETT ? ?Additional Equipment: Arterial line ? ?Intra-op Plan:  ? ?Post-operative Plan: Extubation in OR ? ?Informed Consent: I have reviewed the patients History and Physical, chart, labs and discussed the procedure including the risks, benefits and alternatives for the proposed anesthesia with  the patient or authorized representative who has indicated his/her understanding and acceptance.  ? ? ? ?Dental advisory given ? ?Plan Discussed with: CRNA ? ?Anesthesia Plan Comments:   ? ? ? ? ? ? ?Anesthesia Quick Evaluation ? ?

## 2021-04-05 NOTE — Progress Notes (Signed)
Patient seen at bedside s/p  endovascular treatment of a wide neck left MCA trifurcation aneurysm using a 3.2 5x14 pipeline Shield flow diverter. today with Dr. Estanislado Pandy. ?  ?Patient seen at bedside with Dr. Luanne Bras. Family at beside. Patient laying supine @ 30 degree include eating. Endorses left leg pain that patient states is due tolying flat for the procedure. Patient states this is a known issues and her condition has improved post procedure. No additional complaints specifically denies HA, n/v, chest pain, dyspnea, vision changes, speech difficulties n/v, chest pain, dyspnea. Neuro exam unchanged from pre-procedure, no deficits noted. Alert, aware and oriented X 3, Speech clear and comprehension is intact. No facial droop noted. Right leg remains straight. Can spontaneously move all other extremities. ?  ?Right CFA puncture site clean, dry, dressed appropriately, no active bleeding, soft, appropriately tender to palpation. ?  ?Plan ?- Heparin gtt (managed by pharmacy) ?- Brilinta 90 mg BID starting tonight at 2000, ASA 81 mg QD starting 4.4.23 @ 10;00 ?- NIR will assess patient in AM for potential discharge ?  ?Please call Dr. Estanislado Pandy with overnight concerns. ?  ? ?

## 2021-04-05 NOTE — Progress Notes (Signed)
ANTICOAGULATION CONSULT NOTE - Initial Consult ? ?Pharmacy Consult for Heparin Infusion ?Indication:  post-interventional neuroradiology procedure ? ?Allergies  ?Allergen Reactions  ? Codeine Nausea And Vomiting  ? Hydrocodone Nausea And Vomiting  ?  Also lightheaded and disoriented  ? Latex Rash  ? ? ?Patient Measurements: ?Height: 5\' 3"  (160 cm) ?Weight: 97.5 kg (214 lb 15.2 oz) ?IBW/kg (Calculated) : 52.4 ?Heparin Dosing Weight: 76.1 kg ? ?Vital Signs: ?Temp: 98.1 ?F (36.7 ?C) (04/03 2000) ?Temp Source: Oral (04/03 2000) ?BP: 123/73 (04/03 2100) ?Pulse Rate: 82 (04/03 2100) ? ?Labs: ?Recent Labs  ?  04/05/21 ?4917 04/05/21 ?0724 04/05/21 ?2124  ?HGB  --  12.8  --   ?HCT  --  38.5  --   ?PLT  --  314  --   ?LABPROT 13.2  --   --   ?INR 1.0  --   --   ?HEPARINUNFRC  --   --  <0.10*  ?CREATININE 0.91  --   --   ? ? ? ?Estimated Creatinine Clearance: 61.2 mL/min (by C-G formula based on SCr of 0.91 mg/dL). ? ? ?Medical History: ?Past Medical History:  ?Diagnosis Date  ? Back pain   ? Breast cancer ([place])   ? Family history of breast cancer   ? Family history of melanoma   ? Family history of renal cancer   ? Personal history of radiation therapy   ? PONV (postoperative nausea and vomiting)   ? Seizures ([place])   ? last seizure 2004  3 total 10 yrs apart  ? Wears glasses   ? ? ?Medications:  ?Medications Prior to Admission  ?Medication Sig Dispense Refill Last Dose  ? aspirin EC 81 MG tablet Take 81 mg by mouth daily. Swallow whole.   04/04/2021 at 1700  ? cholecalciferol (VITAMIN D3) 25 MCG (1000 UNIT) tablet Take 1 tablet (1,000 Units total) by mouth daily.   Past Week  ? letrozole (FEMARA) 2.5 MG tablet TAKE 1 TABLET BY MOUTH EVERY DAY 90 tablet 3 04/05/2021 at 0500  ? ticagrelor (BRILINTA) 90 MG TABS tablet Take 90 mg by mouth 2 (two) times daily.   04/05/2021 at 0650  ? albuterol (VENTOLIN HFA) 108 (90 Base) MCG/ACT inhaler Inhale 2 puffs into the lungs every 6 (six) hours as needed for wheezing or shortness of breath.    More than a month  ? ? ?Assessment: ?71 yo F admitted for IR interventional treatment of L MCA aneurysm on 04/05/21. Pharmacy consulted to manage heparin infusion post neuroradiology intervention procedure.  ? ?Heparin level came back undetectable. Continue heparin until AM. We will increase rate.  ? ?Goal of Therapy:  ?Heparin level 0.1-0.25 ?Monitor platelets by anticoagulation protocol: Yes ?  ?Plan:  ?Increase heparin infusion 700 units/hr to complete 17 hrs ?Monitor for s/sx of bleeding. ?Discontinue heparin at 08:00 on 4/4 for sheath removal (order entered).  ? ?Onnie Boer, PharmD, BCIDP, AAHIVP, CPP ?Infectious Disease Pharmacist ?04/05/2021 10:07 PM ? ? ? ? ?

## 2021-04-05 NOTE — Transfer of Care (Signed)
Immediate Anesthesia Transfer of Care Note ? ?Patient: Virginia Lindsey ? ?Procedure(s) Performed: EMBOLIZATION ? ?Patient Location: PACU ? ?Anesthesia Type:General ? ?Level of Consciousness: awake, alert  and oriented ? ?Airway & Oxygen Therapy: Patient Spontanous Breathing and Patient connected to nasal cannula oxygen ? ?Post-op Assessment: Report given to RN and Post -op Vital signs reviewed and stable ? ?Post vital signs: Reviewed and stable ? ?Last Vitals:  ?Vitals Value Taken Time  ?BP 132/74 04/05/21 1308  ?Temp    ?Pulse 81 04/05/21 1312  ?Resp 12 04/05/21 1312  ?SpO2 97 % 04/05/21 1312  ?Vitals shown include unvalidated device data. ? ?Last Pain:  ?Vitals:  ? 04/05/21 0718  ?PainSc: 0-No pain  ?   ? ?Patients Stated Pain Goal: 0 (04/05/21 4373) ? ?Complications: No notable events documented. ?

## 2021-04-05 NOTE — Procedures (Deleted)
  The note originally documented on this encounter has been moved the the encounter in which it belongs.  

## 2021-04-05 NOTE — Sedation Documentation (Signed)
Right groin sheath removed, 39fr angio-seal closure device deployed to right groin ?

## 2021-04-05 NOTE — Addendum Note (Signed)
Addendum  created 04/05/21 1333 by Pervis Hocking, DO  ? Order list changed, Pharmacy for encounter modified  ?  ?

## 2021-04-05 NOTE — Procedures (Signed)
INR. ?Left common carotid arteriogram.  Right CFA approach. ?Status post endovascular treatment of a wide neck left MCA trifurcation aneurysm using a 3.2 5x14 pipeline Shield flow diverter. ?Post CT of the brain demonstrates no hemorrhage. ?Extubated.  ?Denies any H/As,N/V . Responds appropriately. ?Pupils 2 to 3 mm Rt = LT. ?No facial asymmetry. Tongue midline. ?Moves all 4s equally.  ?8 French Angio-Seal closure device used for hemostasis of the right groin puncture site.  Distal pulses intact. ? ?S. Shamonica Schadt MD ? ?

## 2021-04-06 ENCOUNTER — Encounter (HOSPITAL_COMMUNITY): Payer: Self-pay | Admitting: Interventional Radiology

## 2021-04-06 LAB — CBC WITH DIFFERENTIAL/PLATELET
Abs Immature Granulocytes: 0.05 10*3/uL (ref 0.00–0.07)
Basophils Absolute: 0 10*3/uL (ref 0.0–0.1)
Basophils Relative: 0 %
Eosinophils Absolute: 0 10*3/uL (ref 0.0–0.5)
Eosinophils Relative: 0 %
HCT: 33.7 % — ABNORMAL LOW (ref 36.0–46.0)
Hemoglobin: 11.3 g/dL — ABNORMAL LOW (ref 12.0–15.0)
Immature Granulocytes: 1 %
Lymphocytes Relative: 17 %
Lymphs Abs: 1.5 10*3/uL (ref 0.7–4.0)
MCH: 31.3 pg (ref 26.0–34.0)
MCHC: 33.5 g/dL (ref 30.0–36.0)
MCV: 93.4 fL (ref 80.0–100.0)
Monocytes Absolute: 0.7 10*3/uL (ref 0.1–1.0)
Monocytes Relative: 8 %
Neutro Abs: 6.5 10*3/uL (ref 1.7–7.7)
Neutrophils Relative %: 74 %
Platelets: 271 10*3/uL (ref 150–400)
RBC: 3.61 MIL/uL — ABNORMAL LOW (ref 3.87–5.11)
RDW: 12.9 % (ref 11.5–15.5)
WBC: 8.7 10*3/uL (ref 4.0–10.5)
nRBC: 0 % (ref 0.0–0.2)

## 2021-04-06 LAB — BASIC METABOLIC PANEL
Anion gap: 7 (ref 5–15)
BUN: 8 mg/dL (ref 8–23)
CO2: 21 mmol/L — ABNORMAL LOW (ref 22–32)
Calcium: 8.4 mg/dL — ABNORMAL LOW (ref 8.9–10.3)
Chloride: 115 mmol/L — ABNORMAL HIGH (ref 98–111)
Creatinine, Ser: 0.77 mg/dL (ref 0.44–1.00)
GFR, Estimated: >60 mL/min (ref >60)
Glucose, Bld: 116 mg/dL — ABNORMAL HIGH (ref 70–99)
Potassium: 3.5 mmol/L (ref 3.5–5.1)
Sodium: 143 mmol/L (ref 135–145)

## 2021-04-06 LAB — HEPARIN LEVEL (UNFRACTIONATED): Heparin Unfractionated: <0.1 IU/mL — ABNORMAL LOW (ref 0.30–0.70)

## 2021-04-06 MED ORDER — TICAGRELOR 90 MG PO TABS: 90.0000 mg | ORAL_TABLET | Freq: Two times a day (BID) | ORAL | 3 refills | Status: DC

## 2021-04-06 MED ORDER — HEPARIN (PORCINE) 25000 UT/250ML-% IV SOLN: 800.0000 [IU]/h | INTRAVENOUS | Status: AC

## 2021-04-06 NOTE — Discharge Summary (Signed)
? ? ?Patient ID: ?Virginia Lindsey ?MRN: [id number] ?DOB/AGE: September 23, 1950 71 y.o. ? ?Admit date: 04/05/2021 ?Discharge date: 04/06/2021 ? ?Supervising Physician: Luanne Bras ? ?Patient Status: [facility] - In-pt ? ?Admission Diagnoses: Aneurysms ? ?Discharge Diagnoses:  ?Principal Problem: ?  Brain aneurysm ? ? ?Discharged Condition: stable ? ?Hospital Course: 71 y.o. female inpatient. History of breast cancer, renal cancer, seizures. Recently found to have  2 unruptured aneurysms. On 3.21.23 IR Attending Dr. 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 Approximately 3.25 mm x 2.4 mm right middle cerebral artery bifurcation aneurysm. Patient presented to IR on 4.3.23 for cerebral angiogram via a right CFA approach. Status post endovascular treatment of a wide neck left MCA trifurcation aneurysm using a 3.2 5x14 pipeline Shield flow diverter. She was admitted overnight for observation and ongoing management post-procedure.  She followed appropriate recommendations for best rest without issue. She tolerated a regular diet, and are able to mobilize around the room and void on own. Ms Hudon was assessed at bedside this AM alongside Dr. Estanislado Pandy.She denies any additional complaints. Her neuro exam in stable compared to admission. Procedure access site is soft with appreciable psydoanuesym.  Site is mildly tender as expected.  Labs are stable. She is to continue with aspirin 81 mg and Brilinta 90 BID.  She will be seen in follow-up with Dr. Estanislado Pandy.  They are aware they will hear from scheduler with date and time of appointment. Patient given discharge instructions.  ? ?Consults: None ? ?Significant Diagnostic Studies: ?IR 3D Independent Wkst ? ?Result Date: 03/23/2021 ?CLINICAL DATA:  Patient recently discovered to have two unruptured intracranial aneurysms. EXAM: BILATERAL COMMON CAROTID AND INNOMINATE ANGIOGRAPHY COMPARISON:  CTA of the head and  neck of 03/03/2021. MEDICATIONS: Heparin 1000 units IV. No antibiotic was administered within 1 hour of the procedure. ANESTHESIA/SEDATION: Versed 1 mg IV; Fentanyl 25 mcg IV Moderate Sedation Time:  49 minutes The patient was continuously monitored during the procedure by the interventional radiology nurse under my direct supervision. CONTRAST:  Omnipaque 300 approximately 90 mL. FLUOROSCOPY TIME:  Fluoroscopy Time: 10 minutes 18 seconds (1012 mGy). COMPLICATIONS: None immediate. TECHNIQUE: Informed written consent was obtained from the patient after a thorough discussion of the procedural risks, benefits and alternatives. All questions were addressed. Maximal Sterile Barrier Technique was utilized including caps, mask, sterile gowns, sterile gloves, sterile drape, hand hygiene and skin antiseptic. A timeout was performed prior to the initiation of the procedure. The right groin was prepped and draped in the usual sterile fashion. Thereafter using modified Seldinger technique, transfemoral access into the right common femoral artery was obtained without difficulty. Over a 0.035 inch guidewire, a 5 French Pinnacle sheath was inserted. Through this, and also over 0.035 inch guidewire, a 5 [place] JB 1 catheter was advanced to the aortic arch region and selectively positioned in the right common carotid artery, the left common carotid artery, the right vertebral artery and the left vertebral artery. A 3D rotational arteriogram was performed of the left anterior circulation via injection in the left external carotid artery. Reformations were performed on a separate workstation. FINDINGS: The right vertebral artery origin is widely patent. The vessel is seen to opacify to the cranial skull base. Mild fine irregular morphologic changes are seen at the mid cervical and the distal right vertebral artery at the level of C2. More distally, the right vertebrobasilar junction and the right posterior-inferior cerebellar artery  opacify normally.  The distal basilar artery, the superior cerebellar arteries and the anterior-inferior cerebellar arteries opacify into the capillary and venous phases. Partial opacification of the left posterior cerebral artery is seen due to inflow of unopacified blood from the contralateral left vertebral artery. Right common carotid arteriogram demonstrates the right external carotid artery and its major branches to be widely patent. The right internal carotid artery at the bulb to the cranial skull base is widely patent. The petrous, the cavernous and the supraclinoid segments are widely patent. Right posterior communicating artery is seen opacifying the right posterior cerebral artery distribution. The right middle cerebral artery and the right anterior cerebral artery opacify into the capillary and venous phases. Arising at the right middle cerebral artery bifurcation is a saccular aneurysm projecting anteriorly, and slightly inferiorly. This measures approximately 3.25 mm x 2.4 mm. Left common carotid arteriogram demonstrates the left external carotid artery and its major branches to be widely patent. The left internal carotid artery at the bulb to the cranial skull base is widely patent. The petrous, the cavernous and the supraclinoid segments demonstrate wide patency. Left middle cerebral artery and the left anterior cerebral artery opacify into the capillary and venous phases. Arising at the left middle cerebral artery bifurcation is a prominent saccular aneurysm projecting inferiorly and laterally. This measures approximately 8.5 mm x 7.2 mm. The left subclavian arteriogram demonstrates normal branch pattern of the left thyrocervical trunk. The left vertebral artery origin is between the left subclavian artery and the left common carotid artery. The vessel demonstrates segmental mild areas of irregularity with small outpouching in the mid cervical segment, and secondary less prominently at the distal  vertebral artery at the skull base. Distal to this, the left posterior-inferior cerebellar artery and the left vertebrobasilar junction demonstrate wide patency. The basilar artery, the superior cerebellar arteries and the anterior-inferior cerebellar arteries opacify into the capillary and venous phases. Partial opacification of the posterior cerebral arteries is due to flow from the anterior circulation as described above. 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 Approximately 3.25 mm x 2.4 mm right middle cerebral artery bifurcation aneurysm. PLAN: Findings reviewed with patient and daughter. Findings and management to be reviewed in consultation as outpatient. Electronically Signed   By: Luanne Bras M.D.   On: 03/23/2021 09:07  ? ?IR Radiologist Eval & Mgmt ? ?Result Date: 03/25/2021 ?EXAM: ESTABLISHED PATIENT OFFICE VISIT CHIEF COMPLAINT: Patient here to discuss the recent angiographic findings that confirm the presence of two unruptured intracranial aneurysms. Current Pain Level: 1-10 HISTORY OF PRESENT ILLNESS: Patient is a 71 year old right handed lady who returns accompanied by her husband and her daughter to discuss the findings of a recent diagnostic catheter arteriogram. 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 Discussion ensued regarding the natural history of unruptured intracranial aneurysms. The usually stated rupture risk is quoted as between 1-2% per year per aneurysm. Increased risk of rupture associated with hypertension, cigarette smoking, female gender, and family history of intracranial aneurysms was reviewed. As far as is known, there is no known family history of intracranial aneurysms. The significant morbidity and mortality associated with a ruptured intracranial aneurysm was  also reviewed. Management considerations of unruptured aneurysms included continued aggressive surveillance with neuro imaging 6 monthly to 12 monthly initially in order to evaluate for interval changes.

## 2021-04-06 NOTE — Discharge Instructions (Signed)
Activity ?Rest today, increase activity slowly. ?Continue taking 90 mg BID. ?Continue taking Aspirin 81 mg once daily. ?No bending, stooping, or lifting more than 10 pounds for 2 weeks. ?No heavy coughing while turning head for 2 weeks.  ?No driving self for 2 weeks. ?Stay hydrated by drinking plenty of water. ?Follow up with Primary Physician 1 week after discharge ? ?Schedulers will contact you for follow up appointment with Dr. Luanne Bras in 2 to 3 weeks.  ? ?

## 2021-04-06 NOTE — Progress Notes (Signed)
?   04/06/21 1100  ?Vitals  ?Temp (!) 97.5 ?F (36.4 ?C)  ?Temp Source Oral  ?BP (!) 142/129  ?MAP (mmHg) 134  ?Pulse Rate 72  ?ECG Heart Rate 73  ?Resp 14  ?Oxygen Therapy  ?SpO2 96 %  ?MEWS Score  ?MEWS Temp 0  ?MEWS Systolic 0  ?MEWS Pulse 0  ?MEWS RR 0  ?MEWS LOC 0  ?MEWS Score 0  ?MEWS Score Color Green  ? ? ?AVS discharge summary gone over with patient and patient's husband, all questions answered. Patient awaiting lunch tray then will leave. All IV's removed and patient ambulates independently. Will wheel patient down to the front entrance when she is done eating. Husband will take patient home. ? ?Montez Hageman RN ?

## 2021-04-06 NOTE — Progress Notes (Signed)
ANTICOAGULATION CONSULT NOTE ? ?Pharmacy Consult for Heparin Infusion ?Indication:  post-interventional neuroradiology procedure ? ?Allergies  ?Allergen Reactions  ? Codeine Nausea And Vomiting  ? Hydrocodone Nausea And Vomiting  ?  Also lightheaded and disoriented  ? Latex Rash  ? ? ?Patient Measurements: ?Height: 5\' 3"  (160 cm) ?Weight: 97.5 kg (214 lb 15.2 oz) ?IBW/kg (Calculated) : 52.4 ?Heparin Dosing Weight: 76.1 kg ? ?Vital Signs: ?Temp: 98 ?F (36.7 ?C) (04/04 0000) ?Temp Source: Oral (04/04 0000) ?BP: 118/68 (04/04 0200) ?Pulse Rate: 78 (04/04 0200) ? ?Labs: ?Recent Labs  ?  04/05/21 ?8250 04/05/21 ?0724 04/05/21 ?2124 04/06/21 ?0335  ?HGB  --  12.8  --  11.3*  ?HCT  --  38.5  --  33.7*  ?PLT  --  314  --  271  ?LABPROT 13.2  --   --   --   ?INR 1.0  --   --   --   ?HEPARINUNFRC  --   --  <0.10* <0.10*  ?CREATININE 0.91  --   --  0.77  ? ? ? ?Estimated Creatinine Clearance: 69.6 mL/min (by C-G formula based on SCr of 0.77 mg/dL). ? ? ?Assessment: ?71 yo F admitted for IR interventional treatment of L MCA aneurysm on 04/05/21. Pharmacy consulted to manage heparin infusion post neuroradiology intervention procedure.  ? ?Heparin level came back undetectable. No issues with line or bleeding reported per RN. ? ?Goal of Therapy:  ?Heparin level 0.1-0.25 units/ml ?Monitor platelets by anticoagulation protocol: Yes ?  ?Plan:  ?Increase heparin infusion to 800 units/hr - turns off at 0800 this a.m. ? ?Sherlon Handing, PharmD, BCPS ?Please see amion for complete clinical pharmacist phone list ?04/06/2021 4:19 AM ? ? ? ? ?

## 2021-04-06 NOTE — Progress Notes (Signed)
Per Dr Estanislado Pandy, wean patient off of cleviprex. No BP parameters at this time.  ? ?Montez Hageman RN ?
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
## 2021-04-07 ENCOUNTER — Other Ambulatory Visit (HOSPITAL_COMMUNITY): Payer: Self-pay | Admitting: Interventional Radiology

## 2021-04-07 DIAGNOSIS — I671 Cerebral aneurysm, nonruptured: Secondary | ICD-10-CM

## 2021-04-08 ENCOUNTER — Telehealth (HOSPITAL_COMMUNITY): Payer: Self-pay | Admitting: Physician Assistant

## 2021-04-08 NOTE — Progress Notes (Signed)
Patient ID: Virginia Lindsey, female   DOB: [birth year], 71 y.o.   MRN: [id number] ? ?Patient is 3 days s/p embolization of L MCA aneurysm with Dr. Estanislado Pandy.  Received message patient was experiencing redness and warmth at IV site.  Patient expresses she noticed this primarily yesterday and it has since improved with only mild redness remaining.  She expresses FROM of extremity and full sensation.  She has had no bleeding, fever, chills, N/V.  She endorses fatigue. ? ?Discussed with patient that as the IV site is improving, we can expect it to resolve on its own.  Suggested she can use ice or heat for comfort.  Discussed concerns and precautions for calling again or coming to ED for evaluation.   ? ?Patient mentions she is scheduled to see here PCP on Monday.  She is to keep her 2 week follow up appointment with Dr. Estanislado Pandy. ? ? ?Electronically Signed: ?Pasty Spillers, PA ?04/08/2021, 9:39 AM ? ? ? ? ?

## 2021-04-12 DIAGNOSIS — E876 Hypokalemia: Secondary | ICD-10-CM | POA: Diagnosis not present

## 2021-04-12 DIAGNOSIS — E559 Vitamin D deficiency, unspecified: Secondary | ICD-10-CM | POA: Diagnosis not present

## 2021-04-12 DIAGNOSIS — R591 Generalized enlarged lymph nodes: Secondary | ICD-10-CM | POA: Diagnosis not present

## 2021-04-12 DIAGNOSIS — R911 Solitary pulmonary nodule: Secondary | ICD-10-CM | POA: Diagnosis not present

## 2021-04-12 DIAGNOSIS — I671 Cerebral aneurysm, nonruptured: Secondary | ICD-10-CM | POA: Diagnosis not present

## 2021-04-19 ENCOUNTER — Ambulatory Visit (HOSPITAL_COMMUNITY)
Admission: RE | Admit: 2021-04-19 | Discharge: 2021-04-19 | Disposition: A | Discharge status: Home / Self Care | Payer: Medicare PPO | Source: Ambulatory Visit | Attending: Interventional Radiology | Admitting: Interventional Radiology

## 2021-04-19 DIAGNOSIS — Z9889 Other specified postprocedural states: Secondary | ICD-10-CM | POA: Diagnosis not present

## 2021-04-19 DIAGNOSIS — I671 Cerebral aneurysm, nonruptured: Secondary | ICD-10-CM | POA: Diagnosis not present

## 2021-04-21 ENCOUNTER — Telehealth: Payer: Self-pay | Admitting: Pulmonary Disease

## 2021-04-21 HISTORY — PX: IR RADIOLOGIST EVAL & MGMT: IMG5224
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
## 2021-06-02 ENCOUNTER — Ambulatory Visit (HOSPITAL_BASED_OUTPATIENT_CLINIC_OR_DEPARTMENT_OTHER)
Admission: RE | Admit: 2021-06-02 | Discharge: 2021-06-02 | Disposition: A | Discharge status: Home / Self Care | Payer: Medicare PPO | Source: Ambulatory Visit | Attending: Pulmonary Disease | Admitting: Pulmonary Disease

## 2021-06-02 ENCOUNTER — Encounter (HOSPITAL_BASED_OUTPATIENT_CLINIC_OR_DEPARTMENT_OTHER): Payer: Self-pay

## 2021-06-02 DIAGNOSIS — R591 Generalized enlarged lymph nodes: Secondary | ICD-10-CM | POA: Insufficient documentation

## 2021-06-02 DIAGNOSIS — R918 Other nonspecific abnormal finding of lung field: Secondary | ICD-10-CM | POA: Diagnosis not present

## 2021-06-02 MED ORDER — IOHEXOL 300 MG/ML  SOLN
100.0000 mL | Freq: Once | INTRAMUSCULAR | Status: AC | PRN
  Administered 2021-06-02: 75 mL via INTRAVENOUS

## 2021-06-02 NOTE — Addendum Note (Signed)
Addended by: Elby Beck R on: 06/02/2021 10:09 AM   Modules accepted: Orders

## 2021-06-03 ENCOUNTER — Ambulatory Visit (INDEPENDENT_AMBULATORY_CARE_PROVIDER_SITE_OTHER): Payer: Medicare PPO | Admitting: Pulmonary Disease

## 2021-06-03 ENCOUNTER — Ambulatory Visit: Payer: Medicare PPO | Admitting: Pulmonary Disease

## 2021-06-03 ENCOUNTER — Encounter: Payer: Self-pay | Admitting: Pulmonary Disease

## 2021-06-03 VITALS — BP 130/86 | HR 83 | Ht 63.5 in | Wt 213.6 lb

## 2021-06-03 DIAGNOSIS — J42 Unspecified chronic bronchitis: Secondary | ICD-10-CM | POA: Diagnosis not present

## 2021-06-03 DIAGNOSIS — R591 Generalized enlarged lymph nodes: Secondary | ICD-10-CM

## 2021-06-03 LAB — PULMONARY FUNCTION TEST
DL/VA % pred: 101 %
DL/VA: 4.19 ml/min/mmHg/L
DLCO cor % pred: 102 %
DLCO cor: 19.32 ml/min/mmHg
DLCO unc % pred: 94 %
DLCO unc: 17.95 ml/min/mmHg
FEF 25-75 Post: 2.33 L/sec
FEF 25-75 Pre: 2.03 L/sec
FEF2575-%Change-Post: 14 %
FEF2575-%Pred-Post: 135 %
FEF2575-%Pred-Pre: 117 %
FEV1-%Change-Post: 2 %
FEV1-%Pred-Post: 111 %
FEV1-%Pred-Pre: 108 %
FEV1-Post: 2.37 L
FEV1-Pre: 2.3 L
FEV1FVC-%Change-Post: 4 %
FEV1FVC-%Pred-Pre: 105 %
FEV6-%Change-Post: -1 %
FEV6-%Pred-Post: 106 %
FEV6-%Pred-Pre: 107 %
FEV6-Post: 2.86 L
FEV6-Pre: 2.9 L
FEV6FVC-%Change-Post: 0 %
FEV6FVC-%Pred-Post: 104 %
FEV6FVC-%Pred-Pre: 104 %
FVC-%Change-Post: -1 %
FVC-%Pred-Post: 101 %
FVC-%Pred-Pre: 102 %
FVC-Post: 2.86 L
FVC-Pre: 2.9 L
Post FEV1/FVC ratio: 83 %
Post FEV6/FVC ratio: 100 %
Pre FEV1/FVC ratio: 79 %
Pre FEV6/FVC Ratio: 100 %
RV % pred: 82 %
RV: 1.84 L
TLC % pred: 100 %
TLC: 4.99 L

## 2021-06-03 NOTE — Patient Instructions (Addendum)
Borderline mediastinal and hilar lymphadenopathy RUL lung nodule, stable --ORDER PET/CT in 3 months (September 2023). If stable, will recommend 6-12 months  Chronic Bronchitis --STOP albuterol --Recommend over-the counter supportive care  Follow-up with me in 3 months with PET/CT before

## 2021-06-03 NOTE — Progress Notes (Signed)
PFT done today. 
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
## 2021-06-04 ENCOUNTER — Other Ambulatory Visit: Payer: Self-pay | Admitting: *Deleted

## 2021-06-04 DIAGNOSIS — R591 Generalized enlarged lymph nodes: Secondary | ICD-10-CM

## 2021-06-09 ENCOUNTER — Other Ambulatory Visit (HOSPITAL_COMMUNITY): Payer: Self-pay | Admitting: Interventional Radiology

## 2021-06-09 DIAGNOSIS — I671 Cerebral aneurysm, nonruptured: Secondary | ICD-10-CM

## 2021-06-15 ENCOUNTER — Telehealth (HOSPITAL_COMMUNITY): Payer: Self-pay | Admitting: Radiology

## 2021-06-15 NOTE — Telephone Encounter (Signed)
Called pt back, told her that a new request was sent to insurance for her MRI w/wo and that we have requested the auth to be extended until at least 8/2 when her appt is actually scheduled. JM

## 2021-07-01 ENCOUNTER — Other Ambulatory Visit: Payer: Self-pay | Admitting: Hematology and Oncology

## 2021-07-01 DIAGNOSIS — I700 Atherosclerosis of aorta: Secondary | ICD-10-CM | POA: Diagnosis not present

## 2021-07-01 DIAGNOSIS — E559 Vitamin D deficiency, unspecified: Secondary | ICD-10-CM | POA: Diagnosis not present

## 2021-07-01 DIAGNOSIS — Z136 Encounter for screening for cardiovascular disorders: Secondary | ICD-10-CM | POA: Diagnosis not present

## 2021-07-01 DIAGNOSIS — N390 Urinary tract infection, site not specified: Secondary | ICD-10-CM | POA: Diagnosis not present

## 2021-07-01 DIAGNOSIS — R5383 Other fatigue: Secondary | ICD-10-CM | POA: Diagnosis not present

## 2021-07-01 DIAGNOSIS — K219 Gastro-esophageal reflux disease without esophagitis: Secondary | ICD-10-CM | POA: Diagnosis not present

## 2021-07-01 DIAGNOSIS — N6320 Unspecified lump in the left breast, unspecified quadrant: Secondary | ICD-10-CM | POA: Diagnosis not present

## 2021-07-01 DIAGNOSIS — R350 Frequency of micturition: Secondary | ICD-10-CM | POA: Diagnosis not present

## 2021-07-01 DIAGNOSIS — L853 Xerosis cutis: Secondary | ICD-10-CM | POA: Diagnosis not present

## 2021-07-01 DIAGNOSIS — Z0000 Encounter for general adult medical examination without abnormal findings: Secondary | ICD-10-CM | POA: Diagnosis not present

## 2021-07-01 DIAGNOSIS — I671 Cerebral aneurysm, nonruptured: Secondary | ICD-10-CM | POA: Diagnosis not present

## 2021-07-27 DIAGNOSIS — E538 Deficiency of other specified B group vitamins: Secondary | ICD-10-CM | POA: Diagnosis not present

## 2021-07-28 ENCOUNTER — Other Ambulatory Visit (HOSPITAL_COMMUNITY): Payer: Medicare PPO

## 2021-07-29 ENCOUNTER — Other Ambulatory Visit: Payer: Self-pay | Admitting: Obstetrics and Gynecology

## 2021-07-29 DIAGNOSIS — N644 Mastodynia: Secondary | ICD-10-CM | POA: Diagnosis not present

## 2021-08-04 ENCOUNTER — Ambulatory Visit (HOSPITAL_COMMUNITY)
Admission: RE | Admit: 2021-08-04 | Discharge: 2021-08-04 | Disposition: A | Discharge status: Home / Self Care | Payer: Medicare PPO | Source: Ambulatory Visit | Attending: Interventional Radiology | Admitting: Interventional Radiology

## 2021-08-04 DIAGNOSIS — I671 Cerebral aneurysm, nonruptured: Secondary | ICD-10-CM | POA: Diagnosis not present

## 2021-08-04 MED ORDER — GADOBUTROL 1 MMOL/ML IV SOLN
10.0000 mL | Freq: Once | INTRAVENOUS | Status: AC | PRN
  Administered 2021-08-04: 10 mL via INTRAVENOUS

## 2021-08-05 ENCOUNTER — Other Ambulatory Visit: Payer: Self-pay | Admitting: Obstetrics and Gynecology

## 2021-08-05 ENCOUNTER — Telehealth: Payer: Self-pay | Admitting: Internal Medicine

## 2021-08-05 ENCOUNTER — Ambulatory Visit
Admission: RE | Admit: 2021-08-05 | Discharge: 2021-08-05 | Disposition: A | Discharge status: Home / Self Care | Payer: Medicare PPO | Source: Ambulatory Visit | Attending: Obstetrics and Gynecology | Admitting: Obstetrics and Gynecology

## 2021-08-05 DIAGNOSIS — N644 Mastodynia: Secondary | ICD-10-CM

## 2021-08-05 DIAGNOSIS — N6489 Other specified disorders of breast: Secondary | ICD-10-CM | POA: Diagnosis not present

## 2021-08-05 DIAGNOSIS — R922 Inconclusive mammogram: Secondary | ICD-10-CM | POA: Diagnosis not present

## 2021-08-05 DIAGNOSIS — N641 Fat necrosis of breast: Secondary | ICD-10-CM

## 2021-08-05 NOTE — Progress Notes (Signed)
Patient results were reviewed with Dr. Estanislado Pandy who recommends patient reduce her Brilinta from 90 mg BID to 45 mg BID and continue 81 mg ASA once daily. I called pt and notified her of the medication change. Imaging results were discussed. Pt verbalizes understanding to reduce Brilinta dose to half dose twice daily. She will follow up with repeat imaging in 6 months. If she has symptoms, she will contact radiology for evaluation.     Narda Rutherford, AGNP-BC 08/05/2021, 4:54 PM

## 2021-08-22 NOTE — Progress Notes (Signed)
Patient Care Team: Antony Contras, MD as PCP - General (Family Medicine) Molli Posey, MD as Consulting Physician (Obstetrics and Gynecology) Nicholas Lose, MD as Consulting Physician (Hematology and Oncology) Eppie Gibson, MD as Attending Physician (Radiation Oncology) Rolm Bookbinder, MD as Consulting Physician (General Surgery)  DIAGNOSIS: No diagnosis found.  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 This gene mutation increases your risk to develop melanoma and kidney cancer.  There were no genetic changes in the following genes:   ALK, APC, ATM, AXIN2,BAP1,  BARD1, BLM, BMPR1A, BRCA1, BRCA2, BRIP1, CASR, CDC73, CDH1, CDK4, CDKN1B, CDKN1C, CDKN2A (p14ARF), CDKN2A (p16INK4a), CEBPA, CHEK2, CTNNA1, DICER1, DIS3L2, EGFR (c.2369C>T, p.Thr790Met variant only), EPCAM (Deletion/duplication testing only), FH, FLCN, GATA2, GPC3, GREM1 (Promoter region deletion/duplication testing only), HOXB13 (c.251G>A, p.Gly84Glu), HRAS, KIT, MAX, MEN1, MET, MLH1, MSH2, MSH3, MSH6, MUTYH, NBN, NF1, NF2, NTHL1, PALB2, PDGFRA, PHOX2B, PMS2, POLD1, POLE, POT1, PRKAR1A, PTCH1, PTEN, RAD50, RAD51C, RAD51D, RB1, RECQL4, RET, RUNX1, SDHAF2, SDHA (sequence changes only), SDHB, SDHC, SDHD, SMAD4, SMARCA4, SMARCB1, SMARCE1, STK11, SUFU, TERT, TERT, TMEM127, TP53, TSC1, TSC2, VHL, WRN and WT1.         03/2017 -  Anti-estrogen oral therapy   Letrozole daily     CHIEF COMPLIANT: Follow-up of left breast cancer on letrozole therapy    INTERVAL  HISTORY: Virginia Lindsey is a 71 y.o. with above-mentioned history of left breast cancer treated with lumpectomy, radiation, and who is currently on letrozole therapy. She presents to the clinic today for a follow-up.   ALLERGIES:  is allergic to codeine, hydrocodone, ivp dye [iodinated contrast media], and latex.  MEDICATIONS:  Current Outpatient Medications  Medication Sig Dispense Refill   albuterol (VENTOLIN HFA) 108 (90 Base) MCG/ACT inhaler Inhale 2 puffs into the lungs every 6 (six) hours as needed for wheezing or shortness of breath.     aspirin EC 81 MG tablet Take 81 mg by mouth daily. Swallow whole.     cholecalciferol (VITAMIN D3) 25 MCG (1000 UNIT) tablet Take 1 tablet (1,000 Units total) by mouth daily.     letrozole (FEMARA) 2.5 MG tablet TAKE 1 TABLET BY MOUTH EVERY DAY 90 tablet 3   ticagrelor (BRILINTA) 90 MG TABS tablet Take 1 tablet (90 mg total) by mouth 2 (two) times daily. 60 tablet 3   No current facility-administered medications for this visit.    PHYSICAL EXAMINATION: ECOG PERFORMANCE STATUS: {CHL ONC ECOG PS:[phone]}  There were no vitals filed for this visit. There were no vitals filed for this visit.  BREAST:*** No palpable masses or nodules in either right or left breasts. No palpable axillary supraclavicular or infraclavicular adenopathy no breast tenderness or nipple discharge. (exam performed in the presence of a chaperone)  LABORATORY DATA:  I have reviewed the data as listed    Latest Ref Rng & Units 04/06/2021    3:35 AM 04/05/2021    7:17 AM 03/18/2021    7:48 AM  CMP  Glucose 70 - 99 mg/dL 116  94  100  BUN 8 - 23 mg/dL _0 Creatinine 0.44 - 1.00 mg/dL 0.77  0.91  0.91   Sodium 135 - 145 mmol/L 143  141  140   Potassium 3.5 - 5.1 mmol/L 3.5  3.3  3.6   Chloride 98 - 111 mmol/L 115  108  107   CO2 22 - 32 mmol/L _1 Calcium 8.9 - 10.3 mg/dL 8.4  9.1  8.7     Lab Results  Component Value Date   WBC 8.7 04/06/2021    HGB 11.3 (L) 04/06/2021   HCT 33.7 (L) 04/06/2021   MCV 93.4 04/06/2021   PLT 271 04/06/2021   NEUTROABS 6.5 04/06/2021    ASSESSMENT & PLAN:  No problem-specific Assessment & Plan notes found for this encounter.    No orders of the defined types were placed in this encounter.  The patient has a good understanding of the overall plan. she agrees with it. she will call with any problems that may develop before the next visit here. Total time spent: 30 mins including face to face time and time spent for planning, charting and co-ordination of care   Suzzette Righter, [place] 08/22/21

## 2021-08-25 ENCOUNTER — Other Ambulatory Visit: Payer: Self-pay

## 2021-08-25 ENCOUNTER — Inpatient Hospital Stay: Payer: Medicare PPO | Attending: Hematology and Oncology | Admitting: Hematology and Oncology

## 2021-08-25 DIAGNOSIS — Z79899 Other long term (current) drug therapy: Secondary | ICD-10-CM | POA: Diagnosis not present

## 2021-08-25 DIAGNOSIS — Z170 Estrogen receptor positive status [ER+]: Secondary | ICD-10-CM | POA: Diagnosis not present

## 2021-08-25 DIAGNOSIS — Z79811 Long term (current) use of aromatase inhibitors: Secondary | ICD-10-CM | POA: Insufficient documentation

## 2021-08-25 DIAGNOSIS — Z7982 Long term (current) use of aspirin: Secondary | ICD-10-CM | POA: Insufficient documentation

## 2021-08-25 DIAGNOSIS — C50412 Malignant neoplasm of upper-outer quadrant of left female breast: Secondary | ICD-10-CM | POA: Insufficient documentation

## 2021-08-25 DIAGNOSIS — Z923 Personal history of irradiation: Secondary | ICD-10-CM | POA: Insufficient documentation
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
## 2021-09-03 ENCOUNTER — Ambulatory Visit (HOSPITAL_COMMUNITY)
Admission: RE | Admit: 2021-09-03 | Discharge: 2021-09-03 | Disposition: A | Discharge status: Home / Self Care | Payer: Medicare PPO | Source: Ambulatory Visit | Attending: Pulmonary Disease | Admitting: Pulmonary Disease

## 2021-09-03 DIAGNOSIS — R591 Generalized enlarged lymph nodes: Secondary | ICD-10-CM | POA: Diagnosis present

## 2021-09-03 DIAGNOSIS — Z853 Personal history of malignant neoplasm of breast: Secondary | ICD-10-CM | POA: Diagnosis not present

## 2021-09-03 DIAGNOSIS — R918 Other nonspecific abnormal finding of lung field: Secondary | ICD-10-CM | POA: Diagnosis not present

## 2021-09-03 DIAGNOSIS — I700 Atherosclerosis of aorta: Secondary | ICD-10-CM | POA: Diagnosis not present

## 2021-09-03 DIAGNOSIS — R911 Solitary pulmonary nodule: Secondary | ICD-10-CM | POA: Diagnosis not present

## 2021-09-03 DIAGNOSIS — R590 Localized enlarged lymph nodes: Secondary | ICD-10-CM | POA: Insufficient documentation

## 2021-09-03 DIAGNOSIS — I2510 Atherosclerotic heart disease of native coronary artery without angina pectoris: Secondary | ICD-10-CM | POA: Diagnosis not present

## 2021-09-03 LAB — GLUCOSE, CAPILLARY: Glucose-Capillary: 103 mg/dL — ABNORMAL HIGH (ref 70–99)

## 2021-09-03 MED ORDER — FLUDEOXYGLUCOSE F - 18 (FDG) INJECTION
10.5000 | Freq: Once | INTRAVENOUS | Status: DC | PRN
Start: 2021-09-03 — End: 2021-09-09

## 2021-09-07 ENCOUNTER — Ambulatory Visit: Payer: Medicare PPO | Admitting: Pulmonary Disease

## 2021-09-07 ENCOUNTER — Encounter: Payer: Self-pay | Admitting: Pulmonary Disease

## 2021-09-07 VITALS — BP 120/90 | HR 75 | Temp 98.7°F | Ht 63.5 in | Wt 212.6 lb

## 2021-09-07 DIAGNOSIS — R911 Solitary pulmonary nodule: Secondary | ICD-10-CM | POA: Diagnosis not present

## 2021-09-07 DIAGNOSIS — R591 Generalized enlarged lymph nodes: Secondary | ICD-10-CM | POA: Diagnosis not present
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
## 2021-09-07 NOTE — Patient Instructions (Signed)
Follow-up with Dr Loanne Drilling in 3 mos.

## 2021-09-09 ENCOUNTER — Other Ambulatory Visit: Payer: Self-pay | Admitting: *Deleted

## 2021-09-09 ENCOUNTER — Telehealth: Payer: Self-pay | Admitting: Pulmonary Disease

## 2021-09-09 DIAGNOSIS — R911 Solitary pulmonary nodule: Secondary | ICD-10-CM

## 2021-09-09 NOTE — Telephone Encounter (Signed)
Virginia Lindsey Telephone Encounter  Updated patient on tumor board discussion. Based on PFTs, PET and CT imagine, recommendation for surgical resection with lymph node sampling.  Patient anxious but wanting to pursue above recommendation.  Ambulatory referral to Cardiothoracic Surgery ordered

## 2021-09-09 NOTE — Telephone Encounter (Signed)
Spoke with the pt  She is scheduled with Dr Kipp Brood for 09/24/21  She is asking if Dr Loanne Drilling thinks it's okay to wait this long and to see Dr Kipp Brood instead to [facility]  Dr Loanne Drilling, if this appt is appropriate please advise and will close msg  I told pt we would call her back if you had some sort of objection to the appt

## 2021-09-09 NOTE — Telephone Encounter (Signed)
Patient checking on referral for Thoracic surgery. Patient phone number is [phone].

## 2021-09-09 NOTE — Progress Notes (Signed)
The proposed treatment discussed in conference is for discussion purpose only and is not a binding recommendation.  The patients have not been physically examined, or presented with their treatment options.  Therefore, final treatment plans cannot be decided.  

## 2021-09-10 NOTE — Telephone Encounter (Signed)
This is an appropriate time frame.

## 2021-09-20 ENCOUNTER — Encounter: Payer: Medicare PPO | Admitting: Thoracic Surgery (Cardiothoracic Vascular Surgery)

## 2021-09-24 ENCOUNTER — Institutional Professional Consult (permissible substitution): Payer: Medicare PPO | Admitting: Thoracic Surgery (Cardiothoracic Vascular Surgery)

## 2021-09-24 ENCOUNTER — Telehealth: Payer: Self-pay | Admitting: Pulmonary Disease

## 2021-09-24 VITALS — BP 148/89 | HR 83 | Resp 18 | Ht 63.5 in | Wt 212.0 lb

## 2021-09-24 DIAGNOSIS — R911 Solitary pulmonary nodule: Secondary | ICD-10-CM | POA: Diagnosis not present

## 2021-09-24 NOTE — H&P (View-Only) (Signed)
[address]       [place] [address]             [phone]                    Virginia Lindsey [facility] Medical Record #[id number] Date of Birth: Mar 14, 1950  Referring: Margaretha Seeds, MD Primary Care: Antony Contras, MD Primary Cardiologist: None  Chief Complaint:    Chief Complaint  Patient presents with   Lung Lesion    Surgical consult, PET Scan 09/03/21/Chest CT 06/02/21/ MR Brain 08/04/21/ PFT's 06/03/21    History of Present Illness:    Virginia Lindsey 71 y.o. female referred for surgical evaluation of a right upper lobe pulmonary nodule.  This has been followed on serial imaging.  She recently had a PET scan which showed stability, and no significant uptake.  She is a lifelong non-smoker but has been exposed to secondhand smoke.  She is very anxious about what this nodule could mean.  She does have a history of breast cancer.  This was on the left side.  She was treated with surgery and radiation.  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  She is currently on Brilinta.       Zubrod Score: At the time of surgery this patient's most appropriate activity status/level should be described as: [x]     0    Normal activity, no symptoms []     1    Restricted in physical strenuous activity but ambulatory, able to do out light work []     2    Ambulatory and capable of self care, unable to do work activities, up and about               >50 % of waking hours                              []     3    Only limited self care, in bed greater than 50% of waking hours []     4    Completely disabled, no self care, confined to bed or chair []     5    Moribund   Past Medical History:  Diagnosis Date   Back pain    Breast cancer ([place])    Family history of breast cancer    Family history of melanoma    Family history of renal cancer    Personal history of radiation therapy    PONV (postoperative nausea and  vomiting)    Seizures ([place])    last seizure 2004  3 total 10 yrs apart   Wears glasses     Past Surgical History:  Procedure Laterality Date   ABDOMINAL HYSTERECTOMY  1991   partial hysterectomy   BREAST BIOPSY Left 10/29/2012   Procedure:  LEFT BREAST WIRE GUIDED EXCISION;  Surgeon: Rolm Bookbinder, MD;  Location: [facility];  Service: General;  Laterality: Left;   BREAST EXCISIONAL BIOPSY     BREAST LUMPECTOMY Left 12/19/2016   BREAST LUMPECTOMY WITH RADIOACTIVE SEED AND SENTINEL LYMPH NODE BIOPSY Left 12/19/2016   Procedure: LEFT BREAST LUMPECTOMY WITH RADIOACTIVE SEED AND LEFT AXILLARY SENTINEL LYMPH NODE BIOPSY;  Surgeon: Rolm Bookbinder, MD;  Location: [facility];  Service: General;  Laterality: Left;   BREAST SURGERY  2013  biopsy-lt   CHOLECYSTECTOMY     COLONOSCOPY     IR 3D INDEPENDENT [facility]ST  03/18/2021   IR 3D INDEPENDENT [facility]ST  04/05/2021   IR ANGIO INTRA EXTRACRAN SEL COM CAROTID INNOMINATE BILAT MOD SED  03/18/2021   IR ANGIO INTRA EXTRACRAN SEL INTERNAL CAROTID UNI L MOD SED  04/05/2021   IR ANGIO VERTEBRAL SEL VERTEBRAL BILAT MOD SED  03/18/2021   IR ANGIOGRAM FOLLOW UP STUDY  04/05/2021   IR CT HEAD LTD  04/05/2021   IR NEURO EACH ADD'L AFTER BASIC UNI LEFT (MS)  04/05/2021   IR RADIOLOGIST EVAL & MGMT  03/15/2021   IR RADIOLOGIST EVAL & MGMT  03/25/2021   IR RADIOLOGIST EVAL & MGMT  04/21/2021   IR TRANSCATH/EMBOLIZ  04/05/2021   LUMBAR LAMINECTOMY/DECOMPRESSION MICRODISCECTOMY Right 08/27/2012   Procedure: RIGHT  LUMBAR FOUR FIVE LAMINECTOMY/DECOMPRESSION MICRODISCECTOMY 1 LEVEL;  Surgeon: Elaina Hoops, MD;  Location: [facility];  Service: Neurosurgery;  Laterality: Right;   RADIOLOGY WITH ANESTHESIA N/A 04/05/2021   Procedure: EMBOLIZATION;  Surgeon: Luanne Bras, MD;  Location: [facility];  Service: Radiology;  Laterality: N/A;   UPPER GI ENDOSCOPY     multiple endoscopy's    Family History  Problem Relation Age of Onset   Asthma Mother     Cancer Mother        renal cell carcinoma, dx in her 69s, d. 70   Cancer Father 56       melanoma on face; d. 79   Diabetes Maternal Grandmother    Breast cancer Sister 40       d. 59   Breast cancer Paternal Aunt 70       dbl mastectomy   Cancer Paternal Uncle        NOS   Melanoma Paternal Aunt    Breast cancer Paternal Aunt    Melanoma Cousin        pat first cousin; daughter of aunt with melanoma   Breast cancer Cousin    Lymphoma Son 4       [place] Lymphoma     Social History   Tobacco Use  Smoking Status Never  Smokeless Tobacco Never    Social History   Substance and Sexual Activity  Alcohol Use No     Allergies  Allergen Reactions   Codeine Nausea And Vomiting   Hydrocodone Nausea And Vomiting    Also lightheaded and disoriented   Ivp Dye [Iodinated Contrast Media] Diarrhea    Recurrent bowel movements post- imaging   Latex Rash    Current Outpatient Medications  Medication Sig Dispense Refill   aspirin EC 81 MG tablet Take 81 mg by mouth daily. Swallow whole.     cholecalciferol (VITAMIN D3) 25 MCG (1000 UNIT) tablet Take 1 tablet (1,000 Units total) by mouth daily.     Cyanocobalamin (B-12) 1000 MCG TABS      letrozole (FEMARA) 2.5 MG tablet TAKE 1 TABLET BY MOUTH EVERY DAY 90 tablet 3   ticagrelor (BRILINTA) 90 MG TABS tablet Take 1 tablet (90 mg total) by mouth 2 (two) times daily. (Patient taking differently: Take 90 mg by mouth 2 (two) times daily. 1/2 tablet BID) 60 tablet 3   albuterol (VENTOLIN HFA) 108 (90 Base) MCG/ACT inhaler Inhale 2 puffs into the lungs every 6 (six) hours as needed for wheezing or shortness of breath. (Patient not taking: Reported on 09/24/2021)     No current facility-administered medications for this visit.    Review  of Systems  Constitutional:  Negative for malaise/fatigue and weight loss.  Respiratory:  Negative for cough and shortness of breath.   Cardiovascular:  Negative for chest pain and leg swelling.   Neurological:  Negative for dizziness.  Psychiatric/Behavioral:  The patient is nervous/anxious.      PHYSICAL EXAMINATION: BP (!) 148/89 (BP Location: Right Arm, Patient Position: Sitting)   Pulse 83   Resp 18   Ht 5' 3.5" (1.613 m)   Wt 212 lb (96.2 kg)   SpO2 96% Comment: RA  BMI 36.97 kg/m  Physical Exam Constitutional:      General: She is not in acute distress.    Appearance: Normal appearance. She is obese.  HENT:     Head: Normocephalic and atraumatic.  Eyes:     Extraocular Movements: Extraocular movements intact.  Cardiovascular:     Rate and Rhythm: Normal rate.  Pulmonary:     Effort: Pulmonary effort is normal. No respiratory distress.  Abdominal:     General: There is no distension.  Musculoskeletal:        General: Normal range of motion.     Cervical back: Normal range of motion.  Skin:    General: Skin is warm and dry.  Neurological:     General: No focal deficit present.     Mental Status: She is alert and oriented to person, place, and time.     Diagnostic Studies & Laboratory data:     Recent Radiology Findings:   NM PET SUPER D CT  Result Date: 09/03/2021 CLINICAL DATA:  Right upper lobe suspicious pulmonary nodule, history of breast cancer * Tracking Code: BO * EXAM: CT CHEST WITHOUT CONTRAST TECHNIQUE: Multidetector CT imaging of the chest was performed using thin slice collimation for electromagnetic bronchoscopy planning purposes, without intravenous contrast. RADIATION DOSE REDUCTION: This exam was performed according to the departmental dose-optimization program which includes automated exposure control, adjustment of the mA and/or kV according to patient size and/or use of iterative reconstruction technique. COMPARISON:  06/02/2021 FINDINGS: Cardiovascular: Aortic atherosclerosis. Normal heart size. Left coronary artery calcifications. No pericardial effusion. Mediastinum/Nodes: No enlarged mediastinal, hilar, or axillary lymph nodes. Thyroid  gland, trachea, and esophagus demonstrate no significant findings. Lungs/Pleura: Lobulated nodule of the posterior right upper lobe abutting and tenting the major fissure has slightly enlarged compared to prior examination dated 06/02/2021, measuring 1.5 x 0.9 cm, previously 1.3 x 0.7 cm (series 4, image 38). Innumerable small centrilobular and ground-glass pulmonary nodules throughout the lungs, most concentrated in the lung apices. Diffuse bilateral bronchial wall thickening. No pleural effusion or pneumothorax. Upper Abdomen: No acute abnormality.  Status post cholecystectomy. Musculoskeletal: Status post left lumpectomy. No acute osseous findings. IMPRESSION: 1. Suspect slight interval enlargement of a lobulated nodule of the posterior right upper lobe abutting and tenting the major fissure, measuring 1.5 x 0.9 cm, previously 1.3 x 0.7 cm when measured similarly. 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 2. Diffuse bilateral bronchial wall thickening with innumerable small centrilobular and ground-glass pulmonary nodules throughout the lungs, most concentrated in the lung apices. 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 3. Coronary artery disease. Aortic Atherosclerosis (ICD10-I70.0). Electronically Signed   By: Delanna Ahmadi M.D.   On: 09/03/2021 16:47   NM PET Image Initial (PI) Skull Base To Thigh  Result Date: 09/03/2021 CLINICAL DATA:  Initial treatment strategy for right upper lobe lung nodule. EXAM: NUCLEAR MEDICINE PET SKULL BASE TO THIGH TECHNIQUE: 10.4 mCi F-18 FDG was  injected intravenously. Full-ring PET imaging was performed from the skull base to thigh after the radiotracer. CT data was obtained and used for attenuation correction and anatomic localization. Fasting blood glucose: 103 mg/dl COMPARISON:  Chest CT 06/02/2021 FINDINGS: Mediastinal blood pool activity: SUV max 2.6 Liver activity: SUV max NA NECK: No hypermetabolic lymph  nodes in the neck. Incidental CT findings: None. CHEST: No hypermetabolism ( SUV max = 1.5) associated with the posterior right upper lobe perifissural nodule (image 54/series 4), stable in size at 1.1 x 0.5 cm today compared to 1.3 x 0.6 cm previously. No new suspicious pulmonary nodule or mass. No hypermetabolic lymphadenopathy in the chest. Low level hypermetabolism in the distal esophagus may be related to esophagitis. Incidental CT findings: None. ABDOMEN/PELVIS: No abnormal hypermetabolic activity within the liver, pancreas, adrenal glands, or spleen. No hypermetabolic lymph nodes in the abdomen or pelvis. Incidental CT findings: Gallbladder surgically absent. Right paraumbilical ventral hernia contains only fat. Insert diverticulosis left. There is mild atherosclerotic calcification of the abdominal aorta without aneurysm. SKELETON: No focal hypermetabolic activity to suggest skeletal metastasis. Incidental CT findings: Degenerative changes noted left hip. No worrisome lytic or sclerotic osseous abnormality. IMPRESSION: 1. Posterior right upper lobe perifissural nodule is stable in size since 06/02/2021. This nodule shows no hypermetabolism on PET imaging. While these findings are reassuring, well differentiated in low-grade neoplasm can be poorly FDG avid and continued surveillance may be warranted. 2. No evidence for mediastinal or hilar lymphadenopathy. No hypermetabolic lymph nodes in the mediastinum or hilar regions. 3. Low level FDG hypermetabolism in the distal esophagus may be related to esophagitis. 4. No unexpected or suspicious hypermetabolism elsewhere in the chest, abdomen, or pelvis. 5.  Aortic Atherosclerois (ICD10-170.0) Electronically Signed   By: Misty Stanley M.D.   On: 09/03/2021 15:58       I have independently reviewed the above radiology studies  and reviewed the findings with the patient.   Recent Lab Findings: Lab Results  Component Value Date   WBC 8.7 04/06/2021   HGB 11.3  (L) 04/06/2021   HCT 33.7 (L) 04/06/2021   PLT 271 04/06/2021   GLUCOSE 116 (H) 04/06/2021   ALT 20 02/24/2021   AST 28 02/24/2021   NA 143 04/06/2021   K 3.5 04/06/2021   CL 115 (H) 04/06/2021   CREATININE 0.77 04/06/2021   BUN 8 04/06/2021   CO2 21 (L) 04/06/2021   TSH 1.125 10/18/2012   INR 1.0 04/05/2021     Assessment / Plan:   71 year old female with a 1.1 cm right upper lobe pulmonary nodule.  She is very anxious about getting this removed.  I have recommended that she undergo a biopsy via bronchoscopy.  Given her history of breast cancer and the lack of avidity on PET/CT I would like the pathologist to have enough time to obtain an accurate diagnosis.  She states that she wants this removed regardless of the pathology results.  After a very extensive discussion of the importance of obtaining an appropriate diagnosis and the differences between a wedge resection and lobectomy she remained firm in her decision.  I will coordinate with my pulmonary colleagues to perform a combination procedure which will include a navigational bronchoscopy followed by the wedge resection.  If we are unable to obtain a diagnosis, or differentiation between potential breast cancer and primary lung cancer then we will not perform the lobectomy at this time, and bring her back once the pathology is completely resulted.  I  spent 110 minutes with  the patient face to face in counseling and coordination of care.    Lajuana Matte 09/24/2021 3:20 PM

## 2021-09-24 NOTE — Telephone Encounter (Signed)
[facility] Telephone Encounter  Contacted patient. Discussed and reviewed PET/CT scan. Discussed recommendations from Cardiothoracic Surgery. Provided reassurance. Combination procedure with navigational bronchoscopy +/- wedge resection pending results planned.

## 2021-09-24 NOTE — Progress Notes (Signed)
[address]       [place] [address]             [phone]                    Virginia Lindsey [place] Medical Record #[id number] Date of Birth: 09-03-1950  Referring: Margaretha Seeds, MD Primary Care: Antony Contras, MD Primary Cardiologist: None  Chief Complaint:    Chief Complaint  Patient presents with   Lung Lesion    Surgical consult, PET Scan 09/03/21/Chest CT 06/02/21/ MR Brain 08/04/21/ PFT's 06/03/21    History of Present Illness:    Virginia Lindsey 71 y.o. female referred for surgical evaluation of a right upper lobe pulmonary nodule.  This has been followed on serial imaging.  She recently had a PET scan which showed stability, and no significant uptake.  She is a lifelong non-smoker but has been exposed to secondhand smoke.  She is very anxious about what this nodule could mean.  She does have a history of breast cancer.  This was on the left side.  She was treated with surgery and radiation.  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  She is currently on Brilinta.       Zubrod Score: At the time of surgery this patient's most appropriate activity status/level should be described as: [x]     0    Normal activity, no symptoms []     1    Restricted in physical strenuous activity but ambulatory, able to do out light work []     2    Ambulatory and capable of self care, unable to do work activities, up and about               >50 % of waking hours                              []     3    Only limited self care, in bed greater than 50% of waking hours []     4    Completely disabled, no self care, confined to bed or chair []     5    Moribund   Past Medical History:  Diagnosis Date   Back pain    Breast cancer ([place])    Family history of breast cancer    Family history of melanoma    Family history of renal cancer    Personal history of radiation therapy    PONV (postoperative nausea and  vomiting)    Seizures ([place])    last seizure 2004  3 total 10 yrs apart   Wears glasses     Past Surgical History:  Procedure Laterality Date   ABDOMINAL HYSTERECTOMY  1991   partial hysterectomy   BREAST BIOPSY Left 10/29/2012   Procedure:  LEFT BREAST WIRE GUIDED EXCISION;  Surgeon: Rolm Bookbinder, MD;  Location: [facility];  Service: General;  Laterality: Left;   BREAST EXCISIONAL BIOPSY     BREAST LUMPECTOMY Left 12/19/2016   BREAST LUMPECTOMY WITH RADIOACTIVE SEED AND SENTINEL LYMPH NODE BIOPSY Left 12/19/2016   Procedure: LEFT BREAST LUMPECTOMY WITH RADIOACTIVE SEED AND LEFT AXILLARY SENTINEL LYMPH NODE BIOPSY;  Surgeon: Rolm Bookbinder, MD;  Location: [facility];  Service: General;  Laterality: Left;   BREAST SURGERY  2013  biopsy-lt   CHOLECYSTECTOMY     COLONOSCOPY     IR 3D INDEPENDENT WKST  03/18/2021   IR 3D INDEPENDENT WKST  04/05/2021   IR ANGIO INTRA EXTRACRAN SEL COM CAROTID INNOMINATE BILAT MOD SED  03/18/2021   IR ANGIO INTRA EXTRACRAN SEL INTERNAL CAROTID UNI L MOD SED  04/05/2021   IR ANGIO VERTEBRAL SEL VERTEBRAL BILAT MOD SED  03/18/2021   IR ANGIOGRAM FOLLOW UP STUDY  04/05/2021   IR CT HEAD LTD  04/05/2021   IR NEURO EACH ADD'L AFTER BASIC UNI LEFT (MS)  04/05/2021   IR RADIOLOGIST EVAL & MGMT  03/15/2021   IR RADIOLOGIST EVAL & MGMT  03/25/2021   IR RADIOLOGIST EVAL & MGMT  04/21/2021   IR TRANSCATH/EMBOLIZ  04/05/2021   LUMBAR LAMINECTOMY/DECOMPRESSION MICRODISCECTOMY Right 08/27/2012   Procedure: RIGHT  LUMBAR FOUR FIVE LAMINECTOMY/DECOMPRESSION MICRODISCECTOMY 1 LEVEL;  Surgeon: Elaina Hoops, MD;  Location: [facility];  Service: Neurosurgery;  Laterality: Right;   RADIOLOGY WITH ANESTHESIA N/A 04/05/2021   Procedure: EMBOLIZATION;  Surgeon: Luanne Bras, MD;  Location: [facility];  Service: Radiology;  Laterality: N/A;   UPPER GI ENDOSCOPY     multiple endoscopy's    Family History  Problem Relation Age of Onset   Asthma Mother     Cancer Mother        renal cell carcinoma, dx in her 62s, d. 52   Cancer Father 34       melanoma on face; d. 35   Diabetes Maternal Grandmother    Breast cancer Sister 13       d. 6   Breast cancer Paternal Aunt 46       dbl mastectomy   Cancer Paternal Uncle        NOS   Melanoma Paternal Aunt    Breast cancer Paternal Aunt    Melanoma Cousin        pat first cousin; daughter of aunt with melanoma   Breast cancer Cousin    Lymphoma Son 4       [place] Lymphoma     Social History   Tobacco Use  Smoking Status Never  Smokeless Tobacco Never    Social History   Substance and Sexual Activity  Alcohol Use No     Allergies  Allergen Reactions   Codeine Nausea And Vomiting   Hydrocodone Nausea And Vomiting    Also lightheaded and disoriented   Ivp Dye [Iodinated Contrast Media] Diarrhea    Recurrent bowel movements post- imaging   Latex Rash    Current Outpatient Medications  Medication Sig Dispense Refill   aspirin EC 81 MG tablet Take 81 mg by mouth daily. Swallow whole.     cholecalciferol (VITAMIN D3) 25 MCG (1000 UNIT) tablet Take 1 tablet (1,000 Units total) by mouth daily.     Cyanocobalamin (B-12) 1000 MCG TABS      letrozole (FEMARA) 2.5 MG tablet TAKE 1 TABLET BY MOUTH EVERY DAY 90 tablet 3   ticagrelor (BRILINTA) 90 MG TABS tablet Take 1 tablet (90 mg total) by mouth 2 (two) times daily. (Patient taking differently: Take 90 mg by mouth 2 (two) times daily. 1/2 tablet BID) 60 tablet 3   albuterol (VENTOLIN HFA) 108 (90 Base) MCG/ACT inhaler Inhale 2 puffs into the lungs every 6 (six) hours as needed for wheezing or shortness of breath. (Patient not taking: Reported on 09/24/2021)     No current facility-administered medications for this visit.    Review  of Systems  Constitutional:  Negative for malaise/fatigue and weight loss.  Respiratory:  Negative for cough and shortness of breath.   Cardiovascular:  Negative for chest pain and leg swelling.   Neurological:  Negative for dizziness.  Psychiatric/Behavioral:  The patient is nervous/anxious.      PHYSICAL EXAMINATION: BP (!) 148/89 (BP Location: Right Arm, Patient Position: Sitting)   Pulse 83   Resp 18   Ht 5' 3.5" (1.613 m)   Wt 212 lb (96.2 kg)   SpO2 96% Comment: RA  BMI 36.97 kg/m  Physical Exam Constitutional:      General: She is not in acute distress.    Appearance: Normal appearance. She is obese.  HENT:     Head: Normocephalic and atraumatic.  Eyes:     Extraocular Movements: Extraocular movements intact.  Cardiovascular:     Rate and Rhythm: Normal rate.  Pulmonary:     Effort: Pulmonary effort is normal. No respiratory distress.  Abdominal:     General: There is no distension.  Musculoskeletal:        General: Normal range of motion.     Cervical back: Normal range of motion.  Skin:    General: Skin is warm and dry.  Neurological:     General: No focal deficit present.     Mental Status: She is alert and oriented to person, place, and time.     Diagnostic Studies & Laboratory data:     Recent Radiology Findings:   NM PET SUPER D CT  Result Date: 09/03/2021 CLINICAL DATA:  Right upper lobe suspicious pulmonary nodule, history of breast cancer * Tracking Code: BO * EXAM: CT CHEST WITHOUT CONTRAST TECHNIQUE: Multidetector CT imaging of the chest was performed using thin slice collimation for electromagnetic bronchoscopy planning purposes, without intravenous contrast. RADIATION DOSE REDUCTION: This exam was performed according to the departmental dose-optimization program which includes automated exposure control, adjustment of the mA and/or kV according to patient size and/or use of iterative reconstruction technique. COMPARISON:  06/02/2021 FINDINGS: Cardiovascular: Aortic atherosclerosis. Normal heart size. Left coronary artery calcifications. No pericardial effusion. Mediastinum/Nodes: No enlarged mediastinal, hilar, or axillary lymph nodes. Thyroid  gland, trachea, and esophagus demonstrate no significant findings. Lungs/Pleura: Lobulated nodule of the posterior right upper lobe abutting and tenting the major fissure has slightly enlarged compared to prior examination dated 06/02/2021, measuring 1.5 x 0.9 cm, previously 1.3 x 0.7 cm (series 4, image 38). Innumerable small centrilobular and ground-glass pulmonary nodules throughout the lungs, most concentrated in the lung apices. Diffuse bilateral bronchial wall thickening. No pleural effusion or pneumothorax. Upper Abdomen: No acute abnormality.  Status post cholecystectomy. Musculoskeletal: Status post left lumpectomy. No acute osseous findings. IMPRESSION: 1. Suspect slight interval enlargement of a lobulated nodule of the posterior right upper lobe abutting and tenting the major fissure, measuring 1.5 x 0.9 cm, previously 1.3 x 0.7 cm when measured similarly. 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 2. Diffuse bilateral bronchial wall thickening with innumerable small centrilobular and ground-glass pulmonary nodules throughout the lungs, most concentrated in the lung apices. 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 3. Coronary artery disease. Aortic Atherosclerosis (ICD10-I70.0). Electronically Signed   By: Delanna Ahmadi M.D.   On: 09/03/2021 16:47   NM PET Image Initial (PI) Skull Base To Thigh  Result Date: 09/03/2021 CLINICAL DATA:  Initial treatment strategy for right upper lobe lung nodule. EXAM: NUCLEAR MEDICINE PET SKULL BASE TO THIGH TECHNIQUE: 10.4 mCi F-18 FDG was  injected intravenously. Full-ring PET imaging was performed from the skull base to thigh after the radiotracer. CT data was obtained and used for attenuation correction and anatomic localization. Fasting blood glucose: 103 mg/dl COMPARISON:  Chest CT 06/02/2021 FINDINGS: Mediastinal blood pool activity: SUV max 2.6 Liver activity: SUV max NA NECK: No hypermetabolic lymph  nodes in the neck. Incidental CT findings: None. CHEST: No hypermetabolism ( SUV max = 1.5) associated with the posterior right upper lobe perifissural nodule (image 54/series 4), stable in size at 1.1 x 0.5 cm today compared to 1.3 x 0.6 cm previously. No new suspicious pulmonary nodule or mass. No hypermetabolic lymphadenopathy in the chest. Low level hypermetabolism in the distal esophagus may be related to esophagitis. Incidental CT findings: None. ABDOMEN/PELVIS: No abnormal hypermetabolic activity within the liver, pancreas, adrenal glands, or spleen. No hypermetabolic lymph nodes in the abdomen or pelvis. Incidental CT findings: Gallbladder surgically absent. Right paraumbilical ventral hernia contains only fat. Insert diverticulosis left. There is mild atherosclerotic calcification of the abdominal aorta without aneurysm. SKELETON: No focal hypermetabolic activity to suggest skeletal metastasis. Incidental CT findings: Degenerative changes noted left hip. No worrisome lytic or sclerotic osseous abnormality. IMPRESSION: 1. Posterior right upper lobe perifissural nodule is stable in size since 06/02/2021. This nodule shows no hypermetabolism on PET imaging. While these findings are reassuring, well differentiated in low-grade neoplasm can be poorly FDG avid and continued surveillance may be warranted. 2. No evidence for mediastinal or hilar lymphadenopathy. No hypermetabolic lymph nodes in the mediastinum or hilar regions. 3. Low level FDG hypermetabolism in the distal esophagus may be related to esophagitis. 4. No unexpected or suspicious hypermetabolism elsewhere in the chest, abdomen, or pelvis. 5.  Aortic Atherosclerois (ICD10-170.0) Electronically Signed   By: Misty Stanley M.D.   On: 09/03/2021 15:58       I have independently reviewed the above radiology studies  and reviewed the findings with the patient.   Recent Lab Findings: Lab Results  Component Value Date   WBC 8.7 04/06/2021   HGB 11.3  (L) 04/06/2021   HCT 33.7 (L) 04/06/2021   PLT 271 04/06/2021   GLUCOSE 116 (H) 04/06/2021   ALT 20 02/24/2021   AST 28 02/24/2021   NA 143 04/06/2021   K 3.5 04/06/2021   CL 115 (H) 04/06/2021   CREATININE 0.77 04/06/2021   BUN 8 04/06/2021   CO2 21 (L) 04/06/2021   TSH 1.125 10/18/2012   INR 1.0 04/05/2021     Assessment / Plan:   71 year old female with a 1.1 cm right upper lobe pulmonary nodule.  She is very anxious about getting this removed.  I have recommended that she undergo a biopsy via bronchoscopy.  Given her history of breast cancer and the lack of avidity on PET/CT I would like the pathologist to have enough time to obtain an accurate diagnosis.  She states that she wants this removed regardless of the pathology results.  After a very extensive discussion of the importance of obtaining an appropriate diagnosis and the differences between a wedge resection and lobectomy she remained firm in her decision.  I will coordinate with my pulmonary colleagues to perform a combination procedure which will include a navigational bronchoscopy followed by the wedge resection.  If we are unable to obtain a diagnosis, or differentiation between potential breast cancer and primary lung cancer then we will not perform the lobectomy at this time, and bring her back once the pathology is completely resulted.  I  spent 110 minutes with  the patient face to face in counseling and coordination of care.    Lajuana Matte 09/24/2021 3:20 PM

## 2021-09-24 NOTE — Telephone Encounter (Signed)
Patient is calling because she is concerned about her PET scan. She is wanting to talk to Dr Loanne Drilling about if biopsy would be a better option  Please advise

## 2021-09-27 ENCOUNTER — Other Ambulatory Visit: Payer: Self-pay | Admitting: *Deleted

## 2021-09-27 ENCOUNTER — Other Ambulatory Visit: Payer: Self-pay

## 2021-09-27 ENCOUNTER — Encounter: Payer: Self-pay | Admitting: Emergency Medicine

## 2021-09-27 ENCOUNTER — Other Ambulatory Visit: Payer: Self-pay | Admitting: Emergency Medicine

## 2021-09-27 ENCOUNTER — Telehealth: Payer: Self-pay | Admitting: Pulmonary Disease

## 2021-09-27 DIAGNOSIS — R911 Solitary pulmonary nodule: Secondary | ICD-10-CM

## 2021-09-27 DIAGNOSIS — Z01812 Encounter for preprocedural laboratory examination: Secondary | ICD-10-CM

## 2021-09-27 NOTE — Progress Notes (Signed)
Orders placed to schedule robotic nav for dye marking of rul nodule.

## 2021-09-28 ENCOUNTER — Telehealth (HOSPITAL_COMMUNITY): Payer: Self-pay | Admitting: Radiology

## 2021-09-28 ENCOUNTER — Other Ambulatory Visit: Payer: Self-pay | Admitting: Thoracic Surgery (Cardiothoracic Vascular Surgery)

## 2021-09-28 ENCOUNTER — Encounter: Payer: Self-pay | Admitting: *Deleted

## 2021-09-28 ENCOUNTER — Telehealth (HOSPITAL_COMMUNITY): Payer: Self-pay | Admitting: *Deleted

## 2021-09-28 DIAGNOSIS — R911 Solitary pulmonary nodule: Secondary | ICD-10-CM

## 2021-09-28 NOTE — Telephone Encounter (Signed)
Pt called with questions about upcoming surgical procedures and cardiology testing and continuing her blood thinners. I relayed this information to Candiss Norse, PA to discuss with Deveshwar and get back in touch with her with his recommendations. Pt is in agreement with this plan of care. JM

## 2021-09-28 NOTE — Telephone Encounter (Signed)
Spoke with pt regarding a message that she had to return a call to our office. Pt has spoken with Judeen Hammans regarding scheduling and Dr Loanne Drilling regarding test results. I advised pt that I don't see where anyone else has tried to call from our office. Pt verbalized understanding. Nothing further needed at this time.

## 2021-09-28 NOTE — Telephone Encounter (Signed)
Patient given detailed instructions per Myocardial Perfusion Study Information Sheet for the test on 10/01/21 at 7:15. Patient notified to arrive 15 minutes early and that it is imperative to arrive on time for appointment to keep from having the test rescheduled.  If you need to cancel or reschedule your appointment, please call the office within 24 hours of your appointment. . Patient verbalized understanding.Virginia Lindsey

## 2021-10-01 ENCOUNTER — Ambulatory Visit (HOSPITAL_COMMUNITY): Payer: Medicare PPO | Attending: Cardiology

## 2021-10-01 VITALS — Ht 63.5 in | Wt 212.0 lb

## 2021-10-01 DIAGNOSIS — R911 Solitary pulmonary nodule: Secondary | ICD-10-CM | POA: Insufficient documentation

## 2021-10-01 DIAGNOSIS — R9439 Abnormal result of other cardiovascular function study: Secondary | ICD-10-CM | POA: Diagnosis not present

## 2021-10-01 DIAGNOSIS — R110 Nausea: Secondary | ICD-10-CM | POA: Diagnosis not present

## 2021-10-01 DIAGNOSIS — Z01810 Encounter for preprocedural cardiovascular examination: Secondary | ICD-10-CM

## 2021-10-01 LAB — MYOCARDIAL PERFUSION IMAGING
LV dias vol: 78 mL (ref 46–106)
LV sys vol: 36 mL
Nuc Stress EF: 54 %
Peak HR: 107 {beats}/min
Rest HR: 74 {beats}/min
Rest Nuclear Isotope Dose: 10.9 mCi
SDS: 2
SRS: 0
SSS: 2
ST Depression (mm): 0 mm
Stress Nuclear Isotope Dose: 29.6 mCi
TID: 1.04

## 2021-10-01 MED ORDER — TECHNETIUM TC 99M TETROFOSMIN IV KIT
29.6000 | PACK | Freq: Once | INTRAVENOUS | Status: AC | PRN
  Administered 2021-10-01: 29.6 via INTRAVENOUS

## 2021-10-01 MED ORDER — AMINOPHYLLINE 25 MG/ML IV SOLN
75.0000 mg | Freq: Once | INTRAVENOUS | Status: AC
  Administered 2021-10-01: 75 mg via INTRAVENOUS

## 2021-10-01 MED ORDER — REGADENOSON 0.4 MG/5ML IV SOLN
0.4000 mg | Freq: Once | INTRAVENOUS | Status: AC
  Administered 2021-10-01: 0.4 mg via INTRAVENOUS

## 2021-10-01 MED ORDER — TECHNETIUM TC 99M TETROFOSMIN IV KIT
10.9000 | PACK | Freq: Once | INTRAVENOUS | Status: AC | PRN
  Administered 2021-10-01: 10.9 via INTRAVENOUS

## 2021-10-03 DIAGNOSIS — C3490 Malignant neoplasm of unspecified part of unspecified bronchus or lung: Secondary | ICD-10-CM

## 2021-10-03 HISTORY — DX: Malignant neoplasm of unspecified part of unspecified bronchus or lung: C34.90

## 2021-10-13 NOTE — Pre-Procedure Instructions (Signed)
Surgical Instructions    Your procedure is scheduled on October 18, 2021.  Report to [facility] Main Entrance "A" at 5:30 A.M., then check in with the Admitting office.  Call this number if you have problems the morning of surgery:  [phone]   If you have any questions prior to your surgery date call [phone]: Open Monday-Friday 8am-4pm    Remember:  Do not eat or drink after midnight the night before your surgery     Take these medicines the morning of surgery with A SIP OF WATER:  letrozole [facility])    Take these medicines the morning of surgery with a sip of water AS NEEDED:  albuterol (VENTOLIN HFA) inhaler    Follow your surgeon's instructions on when to stop Aspirin and ticagrelor (BRILINTA).  If no instructions were given by your surgeon then you will need to call the office to get those instructions.     As of today, STOP taking any Aleve, Naproxen, Ibuprofen, Motrin, Advil, Goody's, BC's, all herbal medications, fish oil, and all vitamins.                     Do NOT Smoke (Tobacco/Vaping) for 24 hours prior to your procedure.  If you use a CPAP at night, you may bring your mask/headgear for your overnight stay.   Contacts, glasses, piercing's, hearing aid's, dentures or partials may not be worn into surgery, please bring cases for these belongings.    For patients admitted to the hospital, discharge time will be determined by your treatment team.   Patients discharged the day of surgery will not be allowed to drive home, and someone needs to stay with them for 24 hours.  SURGICAL WAITING ROOM VISITATION Patients having surgery or a procedure may have no more than 2 support people in the waiting area - these visitors may rotate.   Children under the age of 76 must have an adult with them who is not the patient. If the patient needs to stay at the hospital during part of their recovery, the visitor guidelines for inpatient rooms apply. Pre-op nurse will  coordinate an appropriate time for 1 support person to accompany patient in pre-op.  This support person may not rotate.   Please refer to the [facility] website for the visitor guidelines for Inpatients (after your surgery is over and you are in a regular room).    Special instructions:   [facility]- Preparing For Surgery  Before surgery, you can play an important role. Because skin is not sterile, your skin needs to be as free of germs as possible. You can reduce the number of germs on your skin by washing with CHG (chlorahexidine gluconate) Soap before surgery.  CHG is an antiseptic cleaner which kills germs and bonds with the skin to continue killing germs even after washing.    Oral Hygiene is also important to reduce your risk of infection.  Remember - BRUSH YOUR TEETH THE MORNING OF SURGERY WITH YOUR REGULAR TOOTHPASTE  Please do not use if you have an allergy to CHG or antibacterial soaps. If your skin becomes reddened/irritated stop using the CHG.  Do not shave (including legs and underarms) for at least 48 hours prior to first CHG shower. It is OK to shave your face.  Please follow these instructions carefully.   Shower the NIGHT BEFORE SURGERY and the MORNING OF SURGERY  If you chose to wash your hair, wash your hair first as usual with your  normal shampoo.  After you shampoo, rinse your hair and body thoroughly to remove the shampoo.  Use CHG Soap as you would any other liquid soap. You can apply CHG directly to the skin and wash gently with a scrungie or a clean washcloth.   Apply the CHG Soap to your body ONLY FROM THE NECK DOWN.  Do not use on open wounds or open sores. Avoid contact with your eyes, ears, mouth and genitals (private parts). Wash Face and genitals (private parts)  with your normal soap.   Wash thoroughly, paying special attention to the area where your surgery will be performed.  Thoroughly rinse your body with warm water from the neck down.  DO NOT  shower/wash with your normal soap after using and rinsing off the CHG Soap.  Pat yourself dry with a CLEAN TOWEL.  Wear CLEAN PAJAMAS to bed the night before surgery  Place CLEAN SHEETS on your bed the night before your surgery  DO NOT SLEEP WITH PETS.   Day of Surgery: Take a shower with CHG soap. Do not wear jewelry or makeup Do not wear lotions, powders, perfumes/colognes, or deodorant. Do not shave 48 hours prior to surgery.  Men may shave face and neck. Do not bring valuables to the hospital.  [facility] is not responsible for any belongings or valuables. Do not wear nail polish, gel polish, artificial nails, or any other type of covering on natural nails (fingers and toes) If you have artificial nails or gel coating that need to be removed by a nail salon, please have this removed prior to surgery. Artificial nails or gel coating may interfere with anesthesia's ability to adequately monitor your vital signs. Wear Clean/Comfortable clothing the morning of surgery Remember to brush your teeth WITH YOUR REGULAR TOOTHPASTE.   Please read over the following fact sheets that you were given.    If you received a COVID test during your pre-op visit  it is requested that you wear a mask when out in public, stay away from anyone that may not be feeling well and notify your surgeon if you develop symptoms. If you have been in contact with anyone that has tested positive in the last 10 days please notify you surgeon.

## 2021-10-14 ENCOUNTER — Other Ambulatory Visit: Payer: Self-pay

## 2021-10-14 ENCOUNTER — Encounter (HOSPITAL_COMMUNITY)
Admission: RE | Admit: 2021-10-14 | Discharge: 2021-10-14 | Disposition: A | Discharge status: Home / Self Care | Payer: Medicare PPO | Source: Ambulatory Visit | Attending: Emergency Medicine | Admitting: Emergency Medicine

## 2021-10-14 ENCOUNTER — Encounter (HOSPITAL_COMMUNITY): Payer: Self-pay

## 2021-10-14 ENCOUNTER — Ambulatory Visit (HOSPITAL_COMMUNITY)
Admission: RE | Admit: 2021-10-14 | Discharge: 2021-10-14 | Disposition: A | Discharge status: Home / Self Care | Payer: Medicare PPO | Source: Ambulatory Visit | Attending: Thoracic Surgery (Cardiothoracic Vascular Surgery) | Admitting: Thoracic Surgery (Cardiothoracic Vascular Surgery)

## 2021-10-14 VITALS — BP 156/97 | HR 78 | Temp 98.4°F | Resp 17 | Ht 63.5 in | Wt 215.0 lb

## 2021-10-14 DIAGNOSIS — I493 Ventricular premature depolarization: Secondary | ICD-10-CM | POA: Diagnosis not present

## 2021-10-14 DIAGNOSIS — R911 Solitary pulmonary nodule: Secondary | ICD-10-CM | POA: Diagnosis not present

## 2021-10-14 DIAGNOSIS — Z1152 Encounter for screening for COVID-19: Secondary | ICD-10-CM | POA: Insufficient documentation

## 2021-10-14 DIAGNOSIS — Z01818 Encounter for other preprocedural examination: Secondary | ICD-10-CM | POA: Diagnosis not present

## 2021-10-14 HISTORY — DX: Umbilical hernia without obstruction or gangrene: K42.9

## 2021-10-14 HISTORY — DX: Tachycardia, unspecified: R00.0

## 2021-10-14 LAB — URINALYSIS, ROUTINE W REFLEX MICROSCOPIC
Bacteria, UA: NONE SEEN
Bilirubin Urine: NEGATIVE
Glucose, UA: NEGATIVE mg/dL
Ketones, ur: NEGATIVE mg/dL
Leukocytes,Ua: NEGATIVE
Nitrite: NEGATIVE
Protein, ur: NEGATIVE mg/dL
Specific Gravity, Urine: 1.018 (ref 1.005–1.030)
pH: 5 (ref 5.0–8.0)

## 2021-10-14 LAB — APTT: aPTT: 28 seconds (ref 24–36)

## 2021-10-14 LAB — PROTIME-INR
INR: 1 (ref 0.8–1.2)
Prothrombin Time: 13.2 seconds (ref 11.4–15.2)

## 2021-10-14 LAB — CBC
HCT: 41.8 % (ref 36.0–46.0)
Hemoglobin: 14.1 g/dL (ref 12.0–15.0)
MCH: 32 pg (ref 26.0–34.0)
MCHC: 33.7 g/dL (ref 30.0–36.0)
MCV: 94.8 fL (ref 80.0–100.0)
Platelets: 301 10*3/uL (ref 150–400)
RBC: 4.41 MIL/uL (ref 3.87–5.11)
RDW: 12.8 % (ref 11.5–15.5)
WBC: 8.9 10*3/uL (ref 4.0–10.5)
nRBC: 0 % (ref 0.0–0.2)

## 2021-10-14 LAB — COMPREHENSIVE METABOLIC PANEL
ALT: 18 U/L (ref 0–44)
AST: 17 U/L (ref 15–41)
Albumin: 3.8 g/dL (ref 3.5–5.0)
Alkaline Phosphatase: 97 U/L (ref 38–126)
Anion gap: 7 (ref 5–15)
BUN: 15 mg/dL (ref 8–23)
CO2: 25 mmol/L (ref 22–32)
Calcium: 9.2 mg/dL (ref 8.9–10.3)
Chloride: 107 mmol/L (ref 98–111)
Creatinine, Ser: 0.96 mg/dL (ref 0.44–1.00)
GFR, Estimated: >60 mL/min (ref >60)
Glucose, Bld: 96 mg/dL (ref 70–99)
Potassium: 3.3 mmol/L — ABNORMAL LOW (ref 3.5–5.1)
Sodium: 139 mmol/L (ref 135–145)
Total Bilirubin: 0.8 mg/dL (ref 0.3–1.2)
Total Protein: 7.1 g/dL (ref 6.5–8.1)

## 2021-10-14 LAB — SARS CORONAVIRUS 2 (TAT 6-24 HRS): SARS Coronavirus 2: NEGATIVE

## 2021-10-14 LAB — SURGICAL PCR SCREEN
MRSA, PCR: NEGATIVE
Staphylococcus aureus: NEGATIVE

## 2021-10-14 NOTE — Progress Notes (Signed)
PCP - Dr.David Swayne Cardiologist - denies  PPM/ICD - denies Device Orders - n/a Rep Notified - n/a  Chest x-ray -10/14/2021  EKG - 10/14/2021 Stress Test - 10/01/21 ECHO - n/a Cardiac Cath - n/a  Sleep Study - 20-30 years ago  CPAP - does not use and was told she did not need to use anymore from her dr per pt  Patient denies Diabetes.  Blood Thinner Instructions:YES, Brilinta -patient was told to hold x5 days per Dr.Lightfoot and start baby Aspirin by Dr.Deveshwar.  Aspirin Instructions:continue through DOS  NPO after midnight.   COVID TEST- YES, pending results.    Anesthesia review: YES, abnormal EKG. Patient is on oral chemo. Recent brain aneurysm surgery on 04/05/2021.   Patient denies shortness of breath, fever, cough and chest pain at PAT appointment   All instructions explained to the patient, with a verbal understanding of the material. Patient agrees to go over the instructions while at home for a better understanding. Patient also instructed to self quarantine after being tested for COVID-19. The opportunity to ask questions was provided.

## 2021-10-17 NOTE — Anesthesia Preprocedure Evaluation (Signed)
Anesthesia Evaluation  Patient identified by MRN, date of birth, ID band Patient awake    Reviewed: Allergy & Precautions, NPO status , Patient's Chart, lab work & pertinent test results  History of Anesthesia Complications (+) PONV and history of anesthetic complications  Airway Mallampati: II  TM Distance: >3 FB Neck ROM: Full    Dental no notable dental hx. (+) Dental Advisory Given   Pulmonary sleep apnea ,    Pulmonary exam normal        Cardiovascular negative cardio ROS Normal cardiovascular exam     Neuro/Psych Seizures -, Well Controlled,  1. 8 mm left MCA bifurcation aneurysm. A superior M2 branch arises from the superior aspect of the aneurysm, and an inferior M2 branch arises from the medial aspect of the aneurysm. 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 negative psych ROS   GI/Hepatic negative GI ROS, Neg liver ROS,   Endo/Other  Obesity BMI 38  Renal/GU negative Renal ROS  negative genitourinary   Musculoskeletal negative musculoskeletal ROS (+)   Abdominal   Peds negative pediatric ROS (+)  Hematology negative hematology ROS (+)   Anesthesia Other Findings   Reproductive/Obstetrics negative OB ROS                            Anesthesia Physical  Anesthesia Plan  ASA: 3  Anesthesia Plan: General   Post-op Pain Management: Tylenol PO (pre-op)*   Induction: Intravenous  PONV Risk Score and Plan: 4 or greater and Ondansetron, Dexamethasone, Treatment may vary due to age or medical condition and Diphenhydramine  Airway Management Planned: Oral ETT  Additional Equipment: ClearSight  Intra-op Plan:   Post-operative Plan: Extubation in OR  Informed Consent: I have reviewed the patients History and Physical, chart, labs and discussed the procedure including the risks, benefits and alternatives for the proposed anesthesia with the patient or authorized representative  who has indicated his/her understanding and acceptance.     Dental advisory given  Plan Discussed with: Anesthesiologist  Anesthesia Plan Comments:        Anesthesia Quick Evaluation

## 2021-10-18 ENCOUNTER — Inpatient Hospital Stay (HOSPITAL_COMMUNITY): Payer: Medicare PPO

## 2021-10-18 ENCOUNTER — Encounter (HOSPITAL_COMMUNITY)
Admission: RE | Disposition: A | Discharge status: Home / Self Care | Payer: Self-pay | Source: Home / Self Care | Attending: Thoracic Surgery (Cardiothoracic Vascular Surgery)

## 2021-10-18 ENCOUNTER — Ambulatory Visit (HOSPITAL_COMMUNITY): Payer: Medicare PPO | Admitting: Anesthesiology

## 2021-10-18 ENCOUNTER — Ambulatory Visit (HOSPITAL_COMMUNITY): Payer: Medicare PPO | Admitting: Physician Assistant

## 2021-10-18 ENCOUNTER — Ambulatory Visit (HOSPITAL_COMMUNITY): Payer: Medicare PPO

## 2021-10-18 ENCOUNTER — Encounter (HOSPITAL_COMMUNITY): Payer: Self-pay | Admitting: Emergency Medicine

## 2021-10-18 ENCOUNTER — Inpatient Hospital Stay (HOSPITAL_COMMUNITY)
Admission: RE | Admit: 2021-10-18 | Discharge: 2021-10-19 | DRG: 165 | Disposition: A | Discharge status: Home / Self Care | Payer: Medicare PPO | Attending: Thoracic Surgery (Cardiothoracic Vascular Surgery) | Admitting: Thoracic Surgery (Cardiothoracic Vascular Surgery)

## 2021-10-18 DIAGNOSIS — Z79810 Long term (current) use of selective estrogen receptor modulators (SERMs): Secondary | ICD-10-CM

## 2021-10-18 DIAGNOSIS — C3411 Malignant neoplasm of upper lobe, right bronchus or lung: Principal | ICD-10-CM | POA: Diagnosis present

## 2021-10-18 DIAGNOSIS — Z7982 Long term (current) use of aspirin: Secondary | ICD-10-CM

## 2021-10-18 DIAGNOSIS — E669 Obesity, unspecified: Secondary | ICD-10-CM | POA: Diagnosis not present

## 2021-10-18 DIAGNOSIS — Z808 Family history of malignant neoplasm of other organs or systems: Secondary | ICD-10-CM

## 2021-10-18 DIAGNOSIS — J90 Pleural effusion, not elsewhere classified: Secondary | ICD-10-CM | POA: Diagnosis not present

## 2021-10-18 DIAGNOSIS — Z803 Family history of malignant neoplasm of breast: Secondary | ICD-10-CM

## 2021-10-18 DIAGNOSIS — Z7902 Long term (current) use of antithrombotics/antiplatelets: Secondary | ICD-10-CM

## 2021-10-18 DIAGNOSIS — Z825 Family history of asthma and other chronic lower respiratory diseases: Secondary | ICD-10-CM

## 2021-10-18 DIAGNOSIS — Z87891 Personal history of nicotine dependence: Secondary | ICD-10-CM | POA: Diagnosis not present

## 2021-10-18 DIAGNOSIS — I671 Cerebral aneurysm, nonruptured: Secondary | ICD-10-CM | POA: Diagnosis not present

## 2021-10-18 DIAGNOSIS — J939 Pneumothorax, unspecified: Secondary | ICD-10-CM | POA: Diagnosis not present

## 2021-10-18 DIAGNOSIS — Z885 Allergy status to narcotic agent status: Secondary | ICD-10-CM | POA: Diagnosis not present

## 2021-10-18 DIAGNOSIS — Z6838 Body mass index (BMI) 38.0-38.9, adult: Secondary | ICD-10-CM | POA: Diagnosis not present

## 2021-10-18 DIAGNOSIS — Z95820 Peripheral vascular angioplasty status with implants and grafts: Secondary | ICD-10-CM

## 2021-10-18 DIAGNOSIS — J9811 Atelectasis: Secondary | ICD-10-CM | POA: Diagnosis not present

## 2021-10-18 DIAGNOSIS — Z6837 Body mass index (BMI) 37.0-37.9, adult: Secondary | ICD-10-CM

## 2021-10-18 DIAGNOSIS — R911 Solitary pulmonary nodule: Secondary | ICD-10-CM | POA: Diagnosis not present

## 2021-10-18 DIAGNOSIS — Z853 Personal history of malignant neoplasm of breast: Secondary | ICD-10-CM | POA: Diagnosis not present

## 2021-10-18 DIAGNOSIS — Z8051 Family history of malignant neoplasm of kidney: Secondary | ICD-10-CM | POA: Diagnosis not present

## 2021-10-18 DIAGNOSIS — Z90711 Acquired absence of uterus with remaining cervical stump: Secondary | ICD-10-CM | POA: Diagnosis not present

## 2021-10-18 DIAGNOSIS — Z91040 Latex allergy status: Secondary | ICD-10-CM

## 2021-10-18 DIAGNOSIS — Z923 Personal history of irradiation: Secondary | ICD-10-CM

## 2021-10-18 DIAGNOSIS — Z91041 Radiographic dye allergy status: Secondary | ICD-10-CM

## 2021-10-18 DIAGNOSIS — G4730 Sleep apnea, unspecified: Secondary | ICD-10-CM | POA: Diagnosis not present

## 2021-10-18 DIAGNOSIS — Z79899 Other long term (current) drug therapy: Secondary | ICD-10-CM | POA: Diagnosis not present

## 2021-10-18 DIAGNOSIS — Z7722 Contact with and (suspected) exposure to environmental tobacco smoke (acute) (chronic): Secondary | ICD-10-CM | POA: Diagnosis not present

## 2021-10-18 DIAGNOSIS — Z833 Family history of diabetes mellitus: Secondary | ICD-10-CM | POA: Diagnosis not present

## 2021-10-18 DIAGNOSIS — Z79811 Long term (current) use of aromatase inhibitors: Secondary | ICD-10-CM

## 2021-10-18 DIAGNOSIS — I10 Essential (primary) hypertension: Secondary | ICD-10-CM | POA: Diagnosis present

## 2021-10-18 DIAGNOSIS — Z807 Family history of other malignant neoplasms of lymphoid, hematopoietic and related tissues: Secondary | ICD-10-CM

## 2021-10-18 HISTORY — PX: FIDUCIAL MARKER PLACEMENT: SHX6858

## 2021-10-18 HISTORY — PX: BRONCHIAL BRUSHINGS: SHX5108

## 2021-10-18 HISTORY — PX: INTERCOSTAL NERVE BLOCK: SHX5021

## 2021-10-18 HISTORY — PX: BRONCHIAL NEEDLE ASPIRATION BIOPSY: SHX5106

## 2021-10-18 HISTORY — PX: VIDEO BRONCHOSCOPY WITH RADIAL ENDOBRONCHIAL ULTRASOUND: SHX6849

## 2021-10-18 LAB — ABO/RH: ABO/RH(D): A NEG

## 2021-10-18 LAB — PREPARE RBC (CROSSMATCH)

## 2021-10-18 SURGERY — WEDGE RESECTION, LUNG, ROBOT-ASSISTED, THORACOSCOPIC
Anesthesia: General | Site: Chest | Laterality: Right

## 2021-10-18 SURGERY — BRONCHOSCOPY, WITH BIOPSY USING ELECTROMAGNETIC NAVIGATION
Anesthesia: General | Laterality: Right

## 2021-10-18 MED ORDER — PROPOFOL 10 MG/ML IV BOLUS
INTRAVENOUS | Status: AC
  Filled 2021-10-18: qty 20

## 2021-10-18 MED ORDER — 0.9 % SODIUM CHLORIDE (POUR BTL) OPTIME
TOPICAL | Status: DC | PRN
  Administered 2021-10-18: 2000 mL

## 2021-10-18 MED ORDER — CHLORHEXIDINE GLUCONATE 0.12 % MT SOLN
15.0000 mL | Freq: Once | OROMUCOSAL | Status: AC
  Administered 2021-10-18: 15 mL via OROMUCOSAL
  Filled 2021-10-18: qty 15

## 2021-10-18 MED ORDER — ONDANSETRON HCL 4 MG/2ML IJ SOLN: 4.0000 mg | Freq: Four times a day (QID) | INTRAMUSCULAR | Status: DC | PRN

## 2021-10-18 MED ORDER — LIDOCAINE 2% (20 MG/ML) 5 ML SYRINGE
INTRAMUSCULAR | Status: DC | PRN
  Administered 2021-10-18: 100 mg via INTRAVENOUS

## 2021-10-18 MED ORDER — PANTOPRAZOLE SODIUM 40 MG PO TBEC
40.0000 mg | DELAYED_RELEASE_TABLET | Freq: Every day | ORAL | Status: DC
  Filled 2021-10-18: qty 1

## 2021-10-18 MED ORDER — ACETAMINOPHEN 500 MG PO TABS
1000.0000 mg | ORAL_TABLET | Freq: Once | ORAL | Status: AC
  Administered 2021-10-18: 1000 mg via ORAL
  Filled 2021-10-18: qty 2

## 2021-10-18 MED ORDER — FENTANYL CITRATE (PF) 100 MCG/2ML IJ SOLN
INTRAMUSCULAR | Status: AC
  Filled 2021-10-18: qty 2

## 2021-10-18 MED ORDER — BISACODYL 5 MG PO TBEC
10.0000 mg | DELAYED_RELEASE_TABLET | Freq: Every day | ORAL | Status: DC
  Administered 2021-10-18 – 2021-10-19 (×2): 10 mg via ORAL
  Filled 2021-10-18 (×2): qty 2

## 2021-10-18 MED ORDER — MORPHINE SULFATE (PF) 2 MG/ML IV SOLN: 2.0000 mg | INTRAVENOUS | Status: DC | PRN

## 2021-10-18 MED ORDER — CEFAZOLIN SODIUM-DEXTROSE 2-4 GM/100ML-% IV SOLN
2.0000 g | Freq: Three times a day (TID) | INTRAVENOUS | Status: AC
  Administered 2021-10-18 (×2): 2 g via INTRAVENOUS
  Filled 2021-10-18 (×2): qty 100

## 2021-10-18 MED ORDER — DEXAMETHASONE SODIUM PHOSPHATE 10 MG/ML IJ SOLN
INTRAMUSCULAR | Status: DC | PRN
  Administered 2021-10-18: 10 mg via INTRAVENOUS

## 2021-10-18 MED ORDER — FENTANYL CITRATE (PF) 100 MCG/2ML IJ SOLN
25.0000 ug | INTRAMUSCULAR | Status: DC | PRN
  Administered 2021-10-18 (×2): 50 ug via INTRAVENOUS

## 2021-10-18 MED ORDER — PHENYLEPHRINE 80 MCG/ML (10ML) SYRINGE FOR IV PUSH (FOR BLOOD PRESSURE SUPPORT)
PREFILLED_SYRINGE | INTRAVENOUS | Status: AC
  Filled 2021-10-18: qty 10

## 2021-10-18 MED ORDER — SENNOSIDES-DOCUSATE SODIUM 8.6-50 MG PO TABS
1.0000 | ORAL_TABLET | Freq: Every day | ORAL | Status: DC
  Administered 2021-10-18: 1 via ORAL
  Filled 2021-10-18: qty 1

## 2021-10-18 MED ORDER — CEFAZOLIN SODIUM-DEXTROSE 2-4 GM/100ML-% IV SOLN
2.0000 g | INTRAVENOUS | Status: AC
  Administered 2021-10-18: 2 g via INTRAVENOUS
  Filled 2021-10-18: qty 100

## 2021-10-18 MED ORDER — PROMETHAZINE HCL 25 MG/ML IJ SOLN: 6.2500 mg | INTRAMUSCULAR | Status: DC | PRN

## 2021-10-18 MED ORDER — PHENYLEPHRINE 80 MCG/ML (10ML) SYRINGE FOR IV PUSH (FOR BLOOD PRESSURE SUPPORT)
PREFILLED_SYRINGE | INTRAVENOUS | Status: DC | PRN
  Administered 2021-10-18: 160 ug via INTRAVENOUS
  Administered 2021-10-18 (×6): 80 ug via INTRAVENOUS

## 2021-10-18 MED ORDER — ACETAMINOPHEN 500 MG PO TABS
ORAL_TABLET | ORAL | Status: AC
  Filled 2021-10-18: qty 2

## 2021-10-18 MED ORDER — SUGAMMADEX SODIUM 200 MG/2ML IV SOLN
INTRAVENOUS | Status: DC | PRN
  Administered 2021-10-18: 388.4 mg via INTRAVENOUS

## 2021-10-18 MED ORDER — ENOXAPARIN SODIUM 40 MG/0.4ML IJ SOSY
40.0000 mg | PREFILLED_SYRINGE | Freq: Every day | INTRAMUSCULAR | Status: DC
  Administered 2021-10-18 – 2021-10-19 (×2): 40 mg via SUBCUTANEOUS
  Filled 2021-10-18 (×2): qty 0.4

## 2021-10-18 MED ORDER — ALBUTEROL SULFATE (2.5 MG/3ML) 0.083% IN NEBU: 2.5000 mg | INHALATION_SOLUTION | RESPIRATORY_TRACT | Status: DC | PRN

## 2021-10-18 MED ORDER — ACETAMINOPHEN 500 MG PO TABS
1000.0000 mg | ORAL_TABLET | Freq: Four times a day (QID) | ORAL | Status: DC
  Administered 2021-10-18 – 2021-10-19 (×5): 1000 mg via ORAL
  Filled 2021-10-18 (×5): qty 2

## 2021-10-18 MED ORDER — ORAL CARE MOUTH RINSE: 15.0000 mL | Freq: Once | OROMUCOSAL | Status: AC

## 2021-10-18 MED ORDER — DEXAMETHASONE SODIUM PHOSPHATE 10 MG/ML IJ SOLN
INTRAMUSCULAR | Status: AC
  Filled 2021-10-18: qty 1

## 2021-10-18 MED ORDER — FENTANYL CITRATE (PF) 250 MCG/5ML IJ SOLN
INTRAMUSCULAR | Status: AC
  Filled 2021-10-18: qty 5

## 2021-10-18 MED ORDER — LACTATED RINGERS IV SOLN: INTRAVENOUS | Status: DC | PRN

## 2021-10-18 MED ORDER — LACTATED RINGERS IV SOLN: INTRAVENOUS | Status: DC

## 2021-10-18 MED ORDER — LETROZOLE 2.5 MG PO TABS
2.5000 mg | ORAL_TABLET | Freq: Every day | ORAL | Status: DC
  Administered 2021-10-19: 2.5 mg via ORAL
  Filled 2021-10-18: qty 1

## 2021-10-18 MED ORDER — KETOROLAC TROMETHAMINE 15 MG/ML IJ SOLN
15.0000 mg | Freq: Four times a day (QID) | INTRAMUSCULAR | Status: DC
  Administered 2021-10-18 – 2021-10-19 (×5): 15 mg via INTRAVENOUS
  Filled 2021-10-18 (×5): qty 1

## 2021-10-18 MED ORDER — ONDANSETRON HCL 4 MG/2ML IJ SOLN
INTRAMUSCULAR | Status: DC | PRN
  Administered 2021-10-18: 4 mg via INTRAVENOUS

## 2021-10-18 MED ORDER — ROCURONIUM BROMIDE 10 MG/ML (PF) SYRINGE
PREFILLED_SYRINGE | INTRAVENOUS | Status: DC | PRN
  Administered 2021-10-18 (×2): 20 mg via INTRAVENOUS
  Administered 2021-10-18: 50 mg via INTRAVENOUS
  Administered 2021-10-18: 80 mg via INTRAVENOUS

## 2021-10-18 MED ORDER — LIDOCAINE 2% (20 MG/ML) 5 ML SYRINGE
INTRAMUSCULAR | Status: AC
  Filled 2021-10-18: qty 5

## 2021-10-18 MED ORDER — PHENYLEPHRINE HCL-NACL 20-0.9 MG/250ML-% IV SOLN
INTRAVENOUS | Status: DC | PRN
  Administered 2021-10-18: 30 ug/min via INTRAVENOUS

## 2021-10-18 MED ORDER — METHYLENE BLUE 1 % INJ SOLN
INTRAVENOUS | Status: DC | PRN
  Administered 2021-10-18: .75 mL

## 2021-10-18 MED ORDER — AMISULPRIDE (ANTIEMETIC) 5 MG/2ML IV SOLN: 10.0000 mg | Freq: Once | INTRAVENOUS | Status: DC | PRN

## 2021-10-18 MED ORDER — SODIUM CHLORIDE FLUSH 0.9 % IV SOLN
INTRAVENOUS | Status: DC | PRN
  Administered 2021-10-18: 100 mL

## 2021-10-18 MED ORDER — TRAMADOL HCL 50 MG PO TABS: 50.0000 mg | ORAL_TABLET | Freq: Four times a day (QID) | ORAL | Status: DC | PRN

## 2021-10-18 MED ORDER — FENTANYL CITRATE (PF) 250 MCG/5ML IJ SOLN
INTRAMUSCULAR | Status: DC | PRN
  Administered 2021-10-18 (×4): 50 ug via INTRAVENOUS

## 2021-10-18 MED ORDER — ONDANSETRON HCL 4 MG/2ML IJ SOLN
INTRAMUSCULAR | Status: AC
  Filled 2021-10-18: qty 2

## 2021-10-18 MED ORDER — ACETAMINOPHEN 160 MG/5ML PO SOLN: 1000.0000 mg | Freq: Four times a day (QID) | ORAL | Status: DC

## 2021-10-18 MED ORDER — ASPIRIN 81 MG PO TBEC
81.0000 mg | DELAYED_RELEASE_TABLET | Freq: Every day | ORAL | Status: DC
  Administered 2021-10-19: 81 mg via ORAL
  Filled 2021-10-18: qty 1

## 2021-10-18 MED ORDER — METOPROLOL TARTRATE 12.5 MG HALF TABLET
ORAL_TABLET | ORAL | Status: AC
  Filled 2021-10-18: qty 1

## 2021-10-18 MED ORDER — ROCURONIUM BROMIDE 10 MG/ML (PF) SYRINGE
PREFILLED_SYRINGE | INTRAVENOUS | Status: AC
  Filled 2021-10-18: qty 10

## 2021-10-18 MED ORDER — PROPOFOL 10 MG/ML IV BOLUS
INTRAVENOUS | Status: DC | PRN
  Administered 2021-10-18: 70 mg via INTRAVENOUS
  Administered 2021-10-18: 120 mg via INTRAVENOUS

## 2021-10-18 SURGICAL SUPPLY — 97 items
ADH SKN CLS APL DERMABOND .7 (GAUZE/BANDAGES/DRESSINGS) ×1
APL PRP STRL LF DISP 70% ISPRP (MISCELLANEOUS) ×1
BAG SPEC RTRVL C125 8X14 (MISCELLANEOUS) ×1
BLADE CLIPPER SURG (BLADE) ×1 IMPLANT
CANISTER SUCT 3000ML PPV (MISCELLANEOUS) ×2 IMPLANT
CANNULA REDUC XI 12-8 STAPL (CANNULA) ×2
CANNULA REDUCER 12-8 DVNC XI (CANNULA) ×2 IMPLANT
CATH THORACIC 28FR (CATHETERS) IMPLANT
CATH TROCAR 20FR (CATHETERS) IMPLANT
CHLORAPREP W/TINT 26 (MISCELLANEOUS) ×1 IMPLANT
CLIP VESOCCLUDE MED 6/CT (CLIP) IMPLANT
CNTNR URN SCR LID CUP LEK RST (MISCELLANEOUS) ×5 IMPLANT
CONN ST 1/4X3/8  BEN (MISCELLANEOUS)
CONN ST 1/4X3/8 BEN (MISCELLANEOUS) IMPLANT
CONT SPEC 4OZ STRL OR WHT (MISCELLANEOUS) ×4
DEFOGGER SCOPE WARMER CLEARIFY (MISCELLANEOUS) ×1 IMPLANT
DERMABOND ADVANCED .7 DNX12 (GAUZE/BANDAGES/DRESSINGS) ×1 IMPLANT
DRAIN CHANNEL 28F RND 3/8 FF (WOUND CARE) IMPLANT
DRAIN CHANNEL 32F RND 10.7 FF (WOUND CARE) IMPLANT
DRAPE ARM DVNC X/XI (DISPOSABLE) ×4 IMPLANT
DRAPE COLUMN DVNC XI (DISPOSABLE) ×1 IMPLANT
DRAPE CV SPLIT W-CLR ANES SCRN (DRAPES) ×1 IMPLANT
DRAPE DA VINCI XI ARM (DISPOSABLE) ×4
DRAPE DA VINCI XI COLUMN (DISPOSABLE) ×1
DRAPE HALF SHEET 40X57 (DRAPES) ×1 IMPLANT
DRAPE ORTHO SPLIT 77X108 STRL (DRAPES) ×1
DRAPE SURG ORHT 6 SPLT 77X108 (DRAPES) ×1 IMPLANT
ELECT BLADE 6.5 EXT (BLADE) IMPLANT
ELECT REM PT RETURN 9FT ADLT (ELECTROSURGICAL) ×1
ELECTRODE REM PT RTRN 9FT ADLT (ELECTROSURGICAL) ×1 IMPLANT
GAUZE KITTNER 4X5 RF (MISCELLANEOUS) ×1 IMPLANT
GAUZE SPONGE 4X4 12PLY STRL (GAUZE/BANDAGES/DRESSINGS) ×1 IMPLANT
GLOVE BIO SURGEON STRL SZ7.5 (GLOVE) ×2 IMPLANT
GLOVE SURG SS PI 8.0 STRL IVOR (GLOVE) ×1 IMPLANT
GOWN STRL REUS W/ TWL LRG LVL3 (GOWN DISPOSABLE) ×2 IMPLANT
GOWN STRL REUS W/ TWL XL LVL3 (GOWN DISPOSABLE) ×2 IMPLANT
GOWN STRL REUS W/TWL 2XL LVL3 (GOWN DISPOSABLE) ×1 IMPLANT
GOWN STRL REUS W/TWL LRG LVL3 (GOWN DISPOSABLE) ×2
GOWN STRL REUS W/TWL XL LVL3 (GOWN DISPOSABLE) ×2
HEMOSTAT SURGICEL 2X14 (HEMOSTASIS) ×3 IMPLANT
KIT BASIN OR (CUSTOM PROCEDURE TRAY) ×1 IMPLANT
KIT SUCTION CATH 14FR (SUCTIONS) IMPLANT
KIT TURNOVER KIT B (KITS) ×1 IMPLANT
NDL 22X1.5 STRL (OR ONLY) (MISCELLANEOUS) ×1 IMPLANT
NDL HYPO 25GX1X1/2 BEV (NEEDLE) ×1 IMPLANT
NEEDLE 22X1.5 STRL (OR ONLY) (MISCELLANEOUS) ×1 IMPLANT
NEEDLE HYPO 25GX1X1/2 BEV (NEEDLE) ×1 IMPLANT
NS IRRIG 1000ML POUR BTL (IV SOLUTION) ×3 IMPLANT
PACK CHEST (CUSTOM PROCEDURE TRAY) ×1 IMPLANT
PAD ARMBOARD 7.5X6 YLW CONV (MISCELLANEOUS) ×5 IMPLANT
PORT ACCESS TROCAR AIRSEAL 12 (TROCAR) ×1 IMPLANT
PORT ACCESS TROCAR AIRSEAL 5M (TROCAR)
RELOAD STAPLE 45 3.5 BLU DVNC (STAPLE) IMPLANT
RELOAD STAPLE 45 4.3 GRN DVNC (STAPLE) IMPLANT
RELOAD STAPLER 3.5X45 BLU DVNC (STAPLE) ×2 IMPLANT
RELOAD STAPLER 4.3X45 GRN DVNC (STAPLE) ×3 IMPLANT
SEAL CANN UNIV 5-8 DVNC XI (MISCELLANEOUS) ×2 IMPLANT
SEAL XI 5MM-8MM UNIVERSAL (MISCELLANEOUS) ×2
SET TRI-LUMEN FLTR TB AIRSEAL (TUBING) ×1 IMPLANT
SOLUTION ELECTROLUBE (MISCELLANEOUS) IMPLANT
SPONGE INTESTINAL PEANUT (DISPOSABLE) IMPLANT
STAPLER 45 DA VINCI SURE FORM (STAPLE) ×1
STAPLER 45 SUREFORM DVNC (STAPLE) IMPLANT
STAPLER CANNULA SEAL DVNC XI (STAPLE) ×2 IMPLANT
STAPLER CANNULA SEAL XI (STAPLE) ×2
STAPLER RELOAD 3.5X45 BLU DVNC (STAPLE) ×2
STAPLER RELOAD 3.5X45 BLUE (STAPLE) ×2
STAPLER RELOAD 4.3X45 GREEN (STAPLE) ×3
STAPLER RELOAD 4.3X45 GRN DVNC (STAPLE) ×3
STOPCOCK 4 WAY LG BORE MALE ST (IV SETS) ×1 IMPLANT
SUT PDS AB 1 CTX 36 (SUTURE) IMPLANT
SUT PROLENE 4 0 RB 1 (SUTURE)
SUT PROLENE 4-0 RB1 .5 CRCL 36 (SUTURE) IMPLANT
SUT SILK  1 MH (SUTURE) ×1
SUT SILK 1 MH (SUTURE) ×1 IMPLANT
SUT SILK 2 0 SH (SUTURE) IMPLANT
SUT SILK 2 0SH CR/8 30 (SUTURE) IMPLANT
SUT VIC AB 1 CTX 36 (SUTURE)
SUT VIC AB 1 CTX36XBRD ANBCTR (SUTURE) IMPLANT
SUT VIC AB 2-0 CT1 27 (SUTURE) ×1
SUT VIC AB 2-0 CT1 TAPERPNT 27 (SUTURE) ×1 IMPLANT
SUT VIC AB 3-0 SH 27 (SUTURE) ×3
SUT VIC AB 3-0 SH 27X BRD (SUTURE) ×3 IMPLANT
SUT VICRYL 0 TIES 12 18 (SUTURE) ×1 IMPLANT
SUT VICRYL 0 UR6 27IN ABS (SUTURE) ×2 IMPLANT
SYR 10ML LL (SYRINGE) ×1 IMPLANT
SYR 20ML LL LF (SYRINGE) ×1 IMPLANT
SYR 50ML LL SCALE MARK (SYRINGE) ×1 IMPLANT
SYSTEM RETRIEVAL ANCHOR 8 (MISCELLANEOUS) IMPLANT
SYSTEM SAHARA CHEST DRAIN ATS (WOUND CARE) ×1 IMPLANT
TAPE CLOTH 4X10 WHT NS (GAUZE/BANDAGES/DRESSINGS) ×1 IMPLANT
TAPE CLOTH SURG 4X10 WHT LF (GAUZE/BANDAGES/DRESSINGS) IMPLANT
TIP APPLICATOR SPRAY EXTEND 16 (VASCULAR PRODUCTS) IMPLANT
TOWEL GREEN STERILE (TOWEL DISPOSABLE) ×1 IMPLANT
TRAY FOLEY SLVR 16FR LF STAT (SET/KITS/TRAYS/PACK) IMPLANT
TUBING EXTENTION W/L.L. (IV SETS) ×1 IMPLANT
WATER STERILE IRR 1000ML POUR (IV SOLUTION) ×1 IMPLANT

## 2021-10-18 NOTE — Discharge Summary (Signed)
Physician Discharge Summary  Patient ID: Virginia Lindsey MRN: [id number] DOB/AGE: 04-08-1950 71 y.o.  Admit date: 10/18/2021 Discharge date: 10/20/2021  Admission Diagnoses:  Discharge Diagnoses:  Principal Problem:   Pulmonary nodule Active Problems:   Right upper lobe pulmonary nodule   Discharged Condition: good  History of Present Illness:    Virginia Lindsey 71 y.o. female referred for surgical evaluation of a right upper lobe pulmonary nodule.  This has been followed on serial imaging.  She recently had a PET scan which showed stability, and no significant uptake.  She is a lifelong non-smoker but has been exposed to secondhand smoke.  She is very anxious about what this nodule could mean.  She does have a history of breast cancer.  This was on the left side.  She was treated with surgery and radiation.  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  She is currently on Brilinta.  Dr. Kipp Brood reviwed the patient's chart, labs and diagnostic studies and concluded that RUL wedge resection with possible RUL lobectomy would provide this patient the best long term treatment. He discussed Virginia Lindsey's treatment options with her as well as the risks and benefits of surgery. Virginia Lindsey was agreeable to proceed with surgical wedge resection and possible lobectomy.  Hospital course: Virginia Lindsey presented to [facility] and was brought to the operating room on 10/18/21. She underwent a right upper lobe wedge resection, she tolerated the procedure well and was transferred to the progressive unit in stable condition.  Postoperative hospital course:  Patient has done well.  She is remain afebrile with stable vital signs.  He did have some hypertension.  This could potentially require some further management as an outpatient.  He is maintaining sinus rhythm.  Oxygen was weaned and she maintains good saturations on room air.  Chest tube on  postop day #1 showed minimal drainage and was removed.  Chest x-ray showed a very tiny apical pneumothorax which was stable following chest tube removal.  Pathology is pending.  She is maintaining normal renal function.  She had a minor reactive leukocytosis with a white blood cell count 11.5 which is felt to be unremarkable.  She is noted not to have a postoperative anemia.  Incisions are healing well without evidence of infection.  She tolerated routine pulmonary hygiene and rehab dowdy is and at the time of discharge the patient is felt to be quite stable.    Consults: None  Significant Diagnostic Studies:  DG Chest 1 View  Result Date: 10/19/2021 CLINICAL DATA:  Status post resection of right lung nodule, pneumothorax EXAM: CHEST  1 VIEW COMPARISON:  Previous studies including the examination done earlier today. FINDINGS: There is interval removal of right chest tube. There is small right apical pneumothorax. Right lung remains well expanded. Small linear densities in medial right upper lung fields suggest subsegmental atelectasis. Transverse diameter of heart is increased. There are no new focal infiltrates or signs of pulmonary edema. IMPRESSION: Interval removal of right chest tube. There is small right apical pneumothorax. Cardiomegaly. Subsegmental atelectasis is seen in medial right upper lung field with no significant change. There are no new infiltrates or signs of pulmonary edema. Electronically Signed   By: Elmer Picker M.D.   On: 10/19/2021 13:40   DG Chest Port 1 View  Result Date: 10/19/2021 CLINICAL DATA:  Partial resection of right lung EXAM: PORTABLE CHEST 1 VIEW COMPARISON:  Previous studies  including the examination of 10/18/2021 FINDINGS: Transverse diameter of heart is increased. Surgical staples are seen in right upper lung field. Right chest tube is noted. There is interval improvement in aeration in left lower lung fields. There are no new infiltrates. There is  blunting of left lateral CP angle. Minimal right apical pneumothorax is seen. IMPRESSION: Minimal right apical pneumothorax. Right chest tube is noted in place. There are no new infiltrates in the lung fields. There are no signs of pulmonary edema. Electronically Signed   By: Elmer Picker M.D.   On: 10/19/2021 08:20   DG Chest Port 1 View  Result Date: 10/18/2021 CLINICAL DATA:  Pneumothorax. Status post resection of right pulmonary nodule. EXAM: PORTABLE CHEST 1 VIEW COMPARISON:  Two-view chest x-ray 10/14/2021. FINDINGS: Heart size exaggerated by low lung volumes. A right-sided chest tube is in place. No significant pneumothorax is present. A small right pleural effusion is present. Patchy airspace opacities are present in the lower lobes bilaterally. IMPRESSION: 1. Right-sided chest tube without significant pneumothorax. 2. Small right pleural effusion. 3. Patchy bibasilar airspace disease likely reflects atelectasis. Electronically Signed   By: San Morelle M.D.   On: 10/18/2021 11:48     Treatments: surgery:   10/18/2021   Patient:  [name] Pre-Op Dx: Right upper lobe pulmonary nodule Hx of breast cancer Hx of cerebral aneurysm   Post-op Dx:  Right upper lobe adenocarcinoma   Procedure: - Robotic assisted right video thoracoscopy - Wedge resection of the right upper lobe - Intercostal nerve block   Surgeon and Role:      * Lajuana Matte, MD - Primary   Assistant: B. Stehler, PA-C  Anesthesia  general PATHOLOGY:  Discharge Exam: Blood pressure (!) 165/81, pulse 77, temperature 99 F (37.2 C), temperature source Oral, resp. rate 18, height 5' 3.5" (1.613 m), weight 97.1 kg, SpO2 94 %.  eneral appearance: alert, cooperative, and no distress Heart: regular rate and rhythm Lungs: clear to auscultation bilaterally Abdomen: Benign exam Extremities: No edema Wound: Incisions healing well   Disposition: Discharge disposition: 01-Home or Self  Care        Allergies as of 10/19/2021       Reactions   Codeine Nausea And Vomiting   Hydrocodone Nausea And Vomiting   Also lightheaded and disoriented   Ivp Dye [iodinated Contrast Media] Diarrhea   Recurrent bowel movements post- imaging   Latex Rash        Medication List     TAKE these medications    acetaminophen 500 MG tablet Commonly known as: TYLENOL Take 2 tablets (1,000 mg total) by mouth every 6 (six) hours.   albuterol 108 (90 Base) MCG/ACT inhaler Commonly known as: VENTOLIN HFA Inhale 1-2 puffs into the lungs every 6 (six) hours as needed for wheezing or shortness of breath.   aspirin EC 81 MG tablet Take 81 mg by mouth daily. Swallow whole.   B-12 1000 MCG Tabs Take 1,000 mcg by mouth daily.   cholecalciferol 25 MCG (1000 UNIT) tablet Commonly known as: VITAMIN D3 Take 2 tablets (2,000 Units total) by mouth daily.   ibuprofen 400 MG tablet Commonly known as: ADVIL Take 1 tablet (400 mg total) by mouth every 8 (eight) hours as needed for mild pain.   letrozole 2.5 MG tablet Commonly known as: FEMARA TAKE 1 TABLET BY MOUTH EVERY DAY   ticagrelor 90 MG Tabs tablet Commonly known as: BRILINTA Take 0.5 tablets (45 mg total) by mouth 2 (  two) times daily.        Follow-up Information     Lightfoot, Lucile Crater, MD Follow up.   Specialty: Cardiothoracic Surgery Why: Please see your discharge paperwork instructions for follow-up appointment with your surgeon. Contact information: [place] [address] [phone]                 Signed: John Giovanni, PA-C  10/20/2021, 10:31 AM

## 2021-10-18 NOTE — H&P (Signed)
Virginia Lindsey is an 71 y.o. female.   Chief Complaint: Right upper lobe pulmonary nodule HPI: 71 year old woman, never smoker, with a history of breast cancer.  She has been followed by Dr. Loanne Drilling in our office for some mediastinal adenopathy and a slowly growing irregularly shaped right upper lobe pulmonary nodule.  PET scan showed no hypermetabolism in the mediastinum, no hypermetabolism in the right upper lobe nodule but it has changed in size from 1.1 cm to 1.3 cm.  She has wanted to pursue surgical resection if at all possible and has met Dr. Kipp Brood.  Plan is for navigational bronchoscopy, hopefully tissue diagnosis prior to either wedge resection or lobectomy as a combined procedure.  Dye marking will be performed.  The patient understands the risk, benefits and agrees to proceed.  No barriers identified.  Past Medical History:  Diagnosis Date   Back pain    Breast cancer ([place]) 2018   left   Family history of breast cancer    Family history of melanoma    Family history of renal cancer    Personal history of radiation therapy    PONV (postoperative nausea and vomiting)    Racing heart beat    per pt=going on for years, PCP aware per pt.   Seizures ([place])    last seizure 2004  3 total 10 yrs apart   Sleep apnea    Dx 20-30 years ago, does not use CPAP-sleeps with head elevated.   Umbilical hernia    Wears glasses     Past Surgical History:  Procedure Laterality Date   ABDOMINAL HYSTERECTOMY  1991   partial hysterectomy   BRAIN SURGERY     BREAST BIOPSY Left 10/29/2012   Procedure:  LEFT BREAST WIRE GUIDED EXCISION;  Surgeon: Rolm Bookbinder, MD;  Location: [place];  Service: General;  Laterality: Left;   BREAST EXCISIONAL BIOPSY     BREAST LUMPECTOMY Left 12/19/2016   BREAST LUMPECTOMY WITH RADIOACTIVE SEED AND SENTINEL LYMPH NODE BIOPSY Left 12/19/2016   Procedure: LEFT BREAST LUMPECTOMY WITH RADIOACTIVE SEED AND LEFT AXILLARY SENTINEL LYMPH NODE  BIOPSY;  Surgeon: Rolm Bookbinder, MD;  Location: [place];  Service: General;  Laterality: Left;   BREAST SURGERY  2013   biopsy-lt   CHOLECYSTECTOMY     COLONOSCOPY     IR 3D INDEPENDENT [facility]  03/18/2021   IR 3D INDEPENDENT [facility]  04/05/2021   IR ANGIO INTRA EXTRACRAN SEL COM CAROTID INNOMINATE BILAT MOD SED  03/18/2021   IR ANGIO INTRA EXTRACRAN SEL INTERNAL CAROTID UNI L MOD SED  04/05/2021   IR ANGIO VERTEBRAL SEL VERTEBRAL BILAT MOD SED  03/18/2021   IR ANGIOGRAM FOLLOW UP STUDY  04/05/2021   IR CT HEAD LTD  04/05/2021   IR NEURO EACH ADD'L AFTER BASIC UNI LEFT (MS)  04/05/2021   IR RADIOLOGIST EVAL & MGMT  03/15/2021   IR RADIOLOGIST EVAL & MGMT  03/25/2021   IR RADIOLOGIST EVAL & MGMT  04/21/2021   IR TRANSCATH/EMBOLIZ  04/05/2021   LUMBAR LAMINECTOMY/DECOMPRESSION MICRODISCECTOMY Right 08/27/2012   Procedure: RIGHT  LUMBAR FOUR FIVE LAMINECTOMY/DECOMPRESSION MICRODISCECTOMY 1 LEVEL;  Surgeon: Elaina Hoops, MD;  Location: [facility];  Service: Neurosurgery;  Laterality: Right;   RADIOLOGY WITH ANESTHESIA N/A 04/05/2021   Procedure: EMBOLIZATION;  Surgeon: Luanne Bras, MD;  Location: [facility];  Service: Radiology;  Laterality: N/A;   UPPER GI ENDOSCOPY     multiple endoscopy's    Family History  Problem Relation Age of Onset   Asthma Mother    Cancer Mother        renal cell carcinoma, dx in her 47s, d. 73   Cancer Father 24       melanoma on face; d. 75   Diabetes Maternal Grandmother    Breast cancer Sister 73       d. 31   Breast cancer Paternal Aunt 13       dbl mastectomy   Cancer Paternal Uncle        NOS   Melanoma Paternal Aunt    Breast cancer Paternal Aunt    Melanoma Cousin        pat first cousin; daughter of aunt with melanoma   Breast cancer Cousin    Lymphoma Son 4       [place] Lymphoma   Social History:  reports that she has never smoked. She has never used smokeless tobacco. She reports that she does not drink alcohol and  does not use drugs.  Allergies:  Allergies  Allergen Reactions   Codeine Nausea And Vomiting   Hydrocodone Nausea And Vomiting    Also lightheaded and disoriented   Ivp Dye [Iodinated Contrast Media] Diarrhea    Recurrent bowel movements post- imaging   Latex Rash    Medications Prior to Admission  Medication Sig Dispense Refill   aspirin EC 81 MG tablet Take 81 mg by mouth daily. Swallow whole.     cholecalciferol (VITAMIN D3) 25 MCG (1000 UNIT) tablet Take 1 tablet (1,000 Units total) by mouth daily. (Patient taking differently: Take 2,000 Units by mouth daily.)     Cyanocobalamin (B-12) 1000 MCG TABS Take 1,000 mcg by mouth daily.     letrozole (FEMARA) 2.5 MG tablet TAKE 1 TABLET BY MOUTH EVERY DAY 90 tablet 3   ticagrelor (BRILINTA) 90 MG TABS tablet Take 1 tablet (90 mg total) by mouth 2 (two) times daily. (Patient taking differently: Take 45 mg by mouth 2 (two) times daily.) 60 tablet 3   albuterol (VENTOLIN HFA) 108 (90 Base) MCG/ACT inhaler Inhale 1-2 puffs into the lungs every 6 (six) hours as needed for wheezing or shortness of breath.      No results found for this or any previous visit (from the past 48 hour(s)). No results found.  Review of Systems  Blood pressure (!) 158/83, pulse 81, temperature 98.2 F (36.8 C), resp. rate 18, height 5' 3.5" (1.613 m), weight 97.1 kg, SpO2 97 %. Physical Exam  Gen: Pleasant, overweight woman, in no distress,  normal affect  ENT: No lesions,  mouth clear, M1 airway, oropharynx clear, no postnasal drip  Neck: No JVD, no stridor  Lungs: No use of accessory muscles, no crackles or wheezing on normal respiration, no wheeze on forced expiration  Cardiovascular: RRR, heart sounds normal, no murmur or gallops, no peripheral edema  Abdomen: soft and NT, no HSM,  BS normal  Musculoskeletal: No deformities, no cyanosis or clubbing  Neuro: alert, awake, non focal  Skin: Warm, no lesions or rashes    Assessment/Plan Slowly  enlarging right upper lobe pulmonary nodule without hypermetabolism on PET scan.  Question primary lung cancer.  This would be inconsistent with metastatic breast cancer but plan for biopsies to help guide next steps and her surgery.  We will perform dye marking as well as biopsies.  Collene Gobble, MD 10/18/2021, 7:17 AM

## 2021-10-18 NOTE — Anesthesia Procedure Notes (Signed)
Procedure Name: MAC Date/Time: 10/18/2021 7:49 AM  Performed by: Lorie Phenix, CRNAPre-anesthesia Checklist: Patient identified, Emergency Drugs available, Suction available and Patient being monitored Patient Re-evaluated:Patient Re-evaluated prior to induction Oxygen Delivery Method: Circle system utilized Preoxygenation: Pre-oxygenation with 100% oxygen Induction Type: IV induction Ventilation: Mask ventilation without difficulty Laryngoscope Size: Mac and 3 Grade View: Grade I Tube type: Oral Tube size: 8.5 mm Number of attempts: 1 Airway Equipment and Method: Stylet Placement Confirmation: ETT inserted through vocal cords under direct vision, positive ETCO2 and breath sounds checked- equal and bilateral Secured at: 23 cm Tube secured with: Tape Dental Injury: Teeth and Oropharynx as per pre-operative assessment

## 2021-10-18 NOTE — Anesthesia Postprocedure Evaluation (Signed)
Anesthesia Post Note  Patient: Virginia Lindsey  Procedure(s) Performed: ROBOTIC ASSISTED NAVIGATIONAL BRONCHOSCOPY (Right) BRONCHIAL NEEDLE ASPIRATION BIOPSIES BRONCHIAL BRUSHINGS Dye Marking VIDEO BRONCHOSCOPY WITH RADIAL ENDOBRONCHIAL ULTRASOUND XI ROBOTIC ASSISTED THORACOSCOPY-RIGHT UPPER LOBE WEDGE RESECTION (Right: Chest) INTERCOSTAL NERVE BLOCK (Right: Chest)     Patient location during evaluation: PACU Anesthesia Type: General Level of consciousness: sedated Pain management: pain level controlled Vital Signs Assessment: post-procedure vital signs reviewed and stable Respiratory status: spontaneous breathing and respiratory function stable Cardiovascular status: stable Postop Assessment: no apparent nausea or vomiting Anesthetic complications: no   No notable events documented.  Last Vitals:  Vitals:   10/18/21 1130 10/18/21 1145  BP: (!) 149/79 (!) 143/87  Pulse: (!) 58 (!) 59  Resp: 11 16  Temp:    SpO2: 100% 100%    Last Pain:  Vitals:   10/18/21 1145  PainSc: 3                  Kieren Ricci DANIEL

## 2021-10-18 NOTE — Hospital Course (Addendum)
History of Present Illness:    Virginia Lindsey 71 y.o. female referred for surgical evaluation of a right upper lobe pulmonary nodule.  This has been followed on serial imaging.  She recently had a PET scan which showed stability, and no significant uptake.  She is a lifelong non-smoker but has been exposed to secondhand smoke.  She is very anxious about what this nodule could mean.  She does have a history of breast cancer.  This was on the left side.  She was treated with surgery and radiation.  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  She is currently on Brilinta.  Dr. Kipp Brood reviwed the patient's chart, labs and diagnostic studies and concluded that RUL wedge resection with possible RUL lobectomy would provide this patient the best long term treatment. He discussed Virginia Lindsey's treatment options with her as well as the risks and benefits of surgery. Virginia Lindsey was agreeable to proceed with surgical wedge resection and possible lobectomy.  Hospital course: Virginia Lindsey presented to [facility] and was brought to the operating room on 10/18/21. She underwent a right upper lobe wedge resection, she tolerated the procedure well and was transferred to the progressive unit in stable condition.  Postoperative hospital course:  Patient has done well.  She is remain afebrile with stable vital signs.  He did have some hypertension.  This could potentially require some further management as an outpatient.  He is maintaining sinus rhythm.  Oxygen was weaned and she maintains good saturations on room air.  Chest tube on postop day #1 showed minimal drainage and was removed.  Chest x-ray showed a very tiny apical pneumothorax which was stable following chest tube removal.  Pathology is pending.  She is maintaining normal renal function.  She had a minor reactive leukocytosis with a white blood cell count 11.5 which is felt to be unremarkable.  She is  noted not to have a postoperative anemia.  Incisions are healing well without evidence of infection.  She tolerated routine pulmonary hygiene and rehab dowdy is and at the time of discharge the patient is felt to be quite stable.

## 2021-10-18 NOTE — Anesthesia Procedure Notes (Signed)
Procedure Name: Intubation Date/Time: 10/18/2021 8:51 AM  Performed by: Lorie Phenix, CRNAPre-anesthesia Checklist: Patient identified, Emergency Drugs available, Suction available and Patient being monitored Patient Re-evaluated:Patient Re-evaluated prior to induction Oxygen Delivery Method: Circle system utilized Preoxygenation: Pre-oxygenation with 100% oxygen Induction Type: Inhalational induction with existing ETT Ventilation: Mask ventilation without difficulty Laryngoscope Size: Mac and 3 Grade View: Grade I Tube type: Oral Endobronchial tube: Left, Double lumen EBT and EBT position confirmed by auscultation and 37 Fr Number of attempts: 1 Airway Equipment and Method: Stylet Placement Confirmation: ETT inserted through vocal cords under direct vision, positive ETCO2 and breath sounds checked- equal and bilateral Secured at: 27 cm Tube secured with: Tape Dental Injury: Teeth and Oropharynx as per pre-operative assessment and Injury to lip

## 2021-10-18 NOTE — Op Note (Signed)
      [address]       [place] [address]             [phone]        10/18/2021  Patient:  Virginia Lindsey Pre-Op Dx: Right upper lobe pulmonary nodule Hx of breast cancer Hx of cerebral aneurysm   Post-op Dx:  Right upper lobe adenocarcinoma  Procedure: - Robotic assisted right video thoracoscopy - Wedge resection of the right upper lobe - Intercostal nerve block  Surgeon and Role:      * Lajuana Matte, MD - Primary   Assistant: B. Stehler, PA-C  Anesthesia  general EBL:  61ml Blood Administration: none Specimen:  right upper lobe wedge  Drains: 24 F argyle chest tube in right chest Counts: correct   Indications: 71 year old female with a 1.1 cm right upper lobe pulmonary nodule.  She is very anxious about getting this removed.  I have recommended that she undergo a biopsy via bronchoscopy.  Given her history of breast cancer and the lack of avidity on PET/CT I would like the pathologist to have enough time to obtain an accurate diagnosis.  She states that she wants this removed regardless of the pathology results.  After a very extensive discussion of the importance of obtaining an appropriate diagnosis and the differences between a wedge resection and lobectomy she remained firm in her decision.  I will coordinate with my pulmonary colleagues to perform a combination procedure which will include a navigational bronchoscopy followed by the wedge resection.  If we are unable to obtain a diagnosis, or differentiation between potential breast cancer and primary lung cancer then we will not perform the lobectomy at this time, and bring her back once the pathology is completely resulted.   Findings: Pathology was consistent with a well differentiated adenocarcinoma.  Breast cancer remained on the differential.  ICG marking was helpful in localizing the nodule.    Operative Technique: After the risks, benefits and alternatives were thoroughly  discussed, the patient was brought to the operative theatre.  Anesthesia was induced, and the patient was then placed in a left lateral decubitus position and was prepped and draped in normal sterile fashion.  An appropriate surgical pause was performed, and pre-operative antibiotics were dosed accordingly.  We began by placing our 3 robotic ports in the the 7th intercostal space targeting the hilum of the lung.  The robot was then docked and all instruments were passed under direct visualization.    Methylene blue marking was evident in the right upper lobe, by the major fissure.  Pleural scaring was adjacent to this area.  A generous wedge resection was performed.  The specimen was then pl  An intercostal nerve block was performed under direct visualization.  A mechanical pleurodesis was then performed.  A 51F chest with then placed, and we watch the remaining lobes re-expand.  The skin and soft tissue were closed with absorbable suture.    The patient tolerated the procedure without any immediate complications, and was transferred to the PACU in stable condition.  Mikeisha Lemonds Bary Leriche

## 2021-10-18 NOTE — Interval H&P Note (Signed)
History and Physical Interval Note:  10/18/2021 7:31 AM  Virginia Lindsey  has presented today for surgery, with the diagnosis of PULMONARY NODULE.  The various methods of treatment have been discussed with the patient and family. After consideration of risks, benefits and other options for treatment, the patient has consented to  Procedure(s): XI ROBOTIC ASSISTED THORACOSCOPY-RIGHT UPPER LOBE WEDGE RESECTION (Right) as a surgical intervention.  The patient's history has been reviewed, patient examined, no change in status, stable for surgery.  I have reviewed the patient's chart and labs.  Questions were answered to the patient's satisfaction.     Martena Emanuele Bary Leriche

## 2021-10-18 NOTE — Transfer of Care (Signed)
Immediate Anesthesia Transfer of Care Note  Patient: Virginia Lindsey  Procedure(s) Performed: ROBOTIC ASSISTED NAVIGATIONAL BRONCHOSCOPY (Right) BRONCHIAL NEEDLE ASPIRATION BIOPSIES BRONCHIAL BRUSHINGS Dye Marking VIDEO BRONCHOSCOPY WITH RADIAL ENDOBRONCHIAL ULTRASOUND XI ROBOTIC ASSISTED THORACOSCOPY-RIGHT UPPER LOBE WEDGE RESECTION (Right: Chest) INTERCOSTAL NERVE BLOCK (Right: Chest)  Patient Location: PACU  Anesthesia Type:General  Level of Consciousness: awake and alert   Airway & Oxygen Therapy: Patient Spontanous Breathing and Patient connected to face mask oxygen  Post-op Assessment: Report given to RN and Post -op Vital signs reviewed and stable  Post vital signs: Reviewed and stable  Last Vitals:  Vitals Value Taken Time  BP 174/90 10/18/21 1017  Temp    Pulse 67 10/18/21 1025  Resp 20 10/18/21 1025  SpO2 97 % 10/18/21 1025  Vitals shown include unvalidated device data.  Last Pain:  Vitals:   10/18/21 0619  PainSc: 0-No pain         Complications: No notable events documented.

## 2021-10-18 NOTE — TOC Progression Note (Signed)
Transition of Care [facility]) - Progression Note    Patient Details  Name: Virginia Lindsey MRN: [id number] Date of Birth: [birth year]  Transition of [facility]) CM/SW Contact  Zenon Mayo, RN Phone Number: 10/18/2021, 4:10 PM  Clinical Narrative:    Patient is from home, she is s/p Robotic assisted right video thoracoscopy and Wedge resection of the right upper lobe, chest tube to water seal.  TOC following.        Expected Discharge Plan and Services                                                 Social Determinants of Health (SDOH) Interventions    Readmission Risk Interventions     No data to display

## 2021-10-18 NOTE — Anesthesia Procedure Notes (Signed)
Arterial Line Insertion Start/End10/16/2023 7:00 AM, 10/18/2021 7:05 AM Performed by: Moshe Salisbury, CRNA, CRNA  Patient location: Pre-op. Preanesthetic checklist: patient identified, IV checked, site marked, risks and benefits discussed, surgical consent, monitors and equipment checked, pre-op evaluation, timeout performed and anesthesia consent Lidocaine 1% used for infiltration Left, radial was placed Catheter size: 20 G Hand hygiene performed  and maximum sterile barriers used   Attempts: 1 Procedure performed without using ultrasound guided technique. Following insertion, Biopatch and dressing applied. Post procedure assessment: normal

## 2021-10-18 NOTE — Brief Op Note (Signed)
10/18/2021  10:17 AM  PATIENT:  Virginia Lindsey  71 y.o. female  PRE-OPERATIVE DIAGNOSIS:  PULMONARY NODULE  POST-OPERATIVE DIAGNOSIS:  PULMONARY NODULE  PROCEDURE:  Procedure(s): XI ROBOTIC ASSISTED THORACOSCOPY-RIGHT UPPER LOBE WEDGE RESECTION (Right) INTERCOSTAL NERVE BLOCK (Right)  SURGEON:  Surgeon(s) and Role:  Lightfoot, Lucile Crater, MD - Primary  PHYSICIAN ASSISTANT: Wynelle Beckmann PA-C  ASSISTANTS: none   ANESTHESIA:   local and general  EBL:  10 mL   BLOOD ADMINISTERED:none  DRAINS:  Right pleural chest tube    LOCAL MEDICATIONS USED:  Exparel  SPECIMEN:  Source of Specimen:  RUL wedge  DISPOSITION OF SPECIMEN:  PATHOLOGY  COUNTS CORRECT:  YES  DICTATION: .Dragon Dictation  PLAN OF CARE: Admit to inpatient   PATIENT DISPOSITION:  PACU - hemodynamically stable.   Delay start of Pharmacological VTE agent (>24hrs) due to surgical blood loss or risk of bleeding: no

## 2021-10-18 NOTE — Op Note (Signed)
Video Bronchoscopy with Robotic Assisted Bronchoscopic Navigation   Date of Operation: 10/18/2021   Pre-op Diagnosis: Right upper lobe pulmonary nodule  Post-op Diagnosis: Same  Surgeon: Baltazar Apo  Assistants: None  Anesthesia: General endotracheal anesthesia  Operation: Flexible video fiberoptic bronchoscopy with robotic assistance and biopsies.  Estimated Blood Loss: Minimal  Complications: None  Indications and History: Virginia Lindsey is a 71 y.o. female with history of prior tobacco use, breast cancer.  She has a slowly enlarging right upper lobe pulmonary nodule that is not hypermetabolic on PET scan.  Suspicious for primary lung malignancy.  Recommendation made to achieve tissue diagnosis and to facilitate VATS wedge resection via navigational bronchoscopy with biopsies by marking. The risks, benefits, complications, treatment options and expected outcomes were discussed with the patient.  The possibilities of pneumothorax, pneumonia, reaction to medication, pulmonary aspiration, perforation of a viscus, bleeding, failure to diagnose a condition and creating a complication requiring transfusion or operation were discussed with the patient who freely signed the consent.    Description of Procedure: The patient was seen in the Preoperative Area, was examined and was deemed appropriate to proceed.  The patient was taken to [facility] endoscopy room 3, identified as [name] and the procedure verified as Flexible Video Fiberoptic Bronchoscopy.  A Time Out was held and the above information confirmed.   Prior to the date of the procedure a high-resolution CT scan of the chest was performed. Utilizing ION software program a virtual tracheobronchial tree was generated to allow the creation of distinct navigation pathways to the patient's parenchymal abnormalities. After being taken to the operating room general anesthesia was initiated and the patient  was orally intubated. The video  fiberoptic bronchoscope was introduced via the endotracheal tube and a general inspection was performed which showed normal right and left lung anatomy. Aspiration of the bilateral mainstems was completed to remove any remaining secretions. Robotic catheter inserted into patient's endotracheal tube.   Target #1 right upper lobe pulmonary nodule: The distinct navigation pathways prepared prior to this procedure were then utilized to navigate to patient's lesion identified on CT scan. The robotic catheter was secured into place and the vision probe was withdrawn.  Lesion location was approximated using fluoroscopy and radial endobronchial ultrasound for peripheral targeting.  Local registration and targeting was performed using Cios three-dimensional imaging.  Under fluoroscopic guidance transbronchial needle brushings, transbronchial needle biopsies were performed to be sent for cytology and pathology.  Under fluoroscopic guidance 0.75 cc of 1:1 mixture methylene blue: indocyanine green was needle injected onto the lesion.  At the end of the procedure a general airway inspection was performed and there was no evidence of active bleeding. The bronchoscope was removed.  The patient tolerated the procedure well. There was no significant blood loss and there were no obvious complications.   Samples Target #1: 1. Transbronchial needle brushings from right upper lobe pulmonary nodule 2. Transbronchial Wang needle biopsies from right upper lobe pulmonary nodule   Plans:  The patient will transfer to the operating room for robotic VATS wedge resection.  Please refer also to Dr. Abran [facility] procedure note.   Baltazar Apo, MD, PhD 10/18/2021, 8:31 AM [facility] [phone] or if no answer before 7:00PM call [phone] For any issues after 7:00PM please call eLink [phone]

## 2021-10-19 ENCOUNTER — Encounter (HOSPITAL_COMMUNITY): Payer: Self-pay | Admitting: Thoracic Surgery (Cardiothoracic Vascular Surgery)

## 2021-10-19 ENCOUNTER — Inpatient Hospital Stay (HOSPITAL_COMMUNITY): Payer: Medicare PPO

## 2021-10-19 LAB — CBC
HCT: 36.6 % (ref 36.0–46.0)
Hemoglobin: 12.6 g/dL (ref 12.0–15.0)
MCH: 31.5 pg (ref 26.0–34.0)
MCHC: 34.4 g/dL (ref 30.0–36.0)
MCV: 91.5 fL (ref 80.0–100.0)
Platelets: 287 10*3/uL (ref 150–400)
RBC: 4 MIL/uL (ref 3.87–5.11)
RDW: 12.5 % (ref 11.5–15.5)
WBC: 11.5 10*3/uL — ABNORMAL HIGH (ref 4.0–10.5)
nRBC: 0 % (ref 0.0–0.2)

## 2021-10-19 LAB — BASIC METABOLIC PANEL
Anion gap: 7 (ref 5–15)
BUN: 13 mg/dL (ref 8–23)
CO2: 24 mmol/L (ref 22–32)
Calcium: 8.9 mg/dL (ref 8.9–10.3)
Chloride: 102 mmol/L (ref 98–111)
Creatinine, Ser: 1 mg/dL (ref 0.44–1.00)
GFR, Estimated: 59 mL/min — ABNORMAL LOW (ref >60)
Glucose, Bld: 145 mg/dL — ABNORMAL HIGH (ref 70–99)
Potassium: 3.9 mmol/L (ref 3.5–5.1)
Sodium: 133 mmol/L — ABNORMAL LOW (ref 135–145)

## 2021-10-19 MED ORDER — TICAGRELOR 90 MG PO TABS: 45.0000 mg | ORAL_TABLET | Freq: Two times a day (BID) | ORAL | Status: DC

## 2021-10-19 MED ORDER — ACETAMINOPHEN 500 MG PO TABS: 1000.0000 mg | ORAL_TABLET | Freq: Four times a day (QID) | ORAL | Status: DC

## 2021-10-19 MED ORDER — VITAMIN D 25 MCG (1000 UNIT) PO TABS: 2000.0000 [IU] | ORAL_TABLET | Freq: Every day | ORAL | Status: AC

## 2021-10-19 MED ORDER — IBUPROFEN 400 MG PO TABS: 400.0000 mg | ORAL_TABLET | Freq: Three times a day (TID) | ORAL | Status: DC | PRN

## 2021-10-19 NOTE — TOC Transition Note (Signed)
Transition of Care [facility]) - CM/SW Discharge Note   Patient Details  Name: Virginia Lindsey MRN: [id number] Date of Birth: 1950-02-13  Transition of Care [facility] CM/SW Contact:  Zenon Mayo, RN Phone Number: 10/19/2021, 3:46 PM   Clinical Narrative:    Patient is for dc home today, has no needs. Spouse will transport her home.          Patient Goals and CMS Choice        Discharge Placement                       Discharge Plan and Services                                     Social Determinants of Health (SDOH) Interventions     Readmission Risk Interventions     No data to display

## 2021-10-19 NOTE — Plan of Care (Signed)

## 2021-10-19 NOTE — Progress Notes (Addendum)
[address]       [place] [address]             [phone]      1 Day Post-Op Procedure(s) (LRB): XI ROBOTIC ASSISTED THORACOSCOPY-RIGHT UPPER LOBE WEDGE RESECTION (Right) INTERCOSTAL NERVE BLOCK (Right) Subjective: Some incisional discomfort  Objective: Vital signs in last 24 hours: Temp:  [97.5 F (36.4 C)-98.6 F (37 C)] 98.4 F (36.9 C) (10/17 0345) Pulse Rate:  [58-79] 71 (10/17 0345) Cardiac Rhythm: Normal sinus rhythm;Bundle branch block (10/17 0733) Resp:  [11-19] 19 (10/17 0345) BP: (137-178)/(76-94) 155/76 (10/17 0345) SpO2:  [93 %-100 %] 96 % (10/17 0345) Arterial Line BP: (154-171)/(74-86) 164/78 (10/16 1145)  Hemodynamic parameters for last 24 hours:    Intake/Output from previous day: 10/16 0701 - 10/17 0700 In: 6440 [P.O.:480; I.V.:1100; IV Piggyback:200] Out: 2858 [Urine:2800; Blood:10; Chest Tube:48] Intake/Output this shift: No intake/output data recorded.  General appearance: alert, cooperative, and no distress Heart: regular rate and rhythm Lungs: clear to auscultation bilaterally Abdomen: Benign exam Extremities: No edema Wound: Incisions healing well  Lab Results: Recent Labs    10/19/21 0014  WBC 11.5*  HGB 12.6  HCT 36.6  PLT 287   BMET:  Recent Labs    10/19/21 0014  NA 133*  K 3.9  CL 102  CO2 24  GLUCOSE 145*  BUN 13  CREATININE 1.00  CALCIUM 8.9    PT/INR: No results for input(s): "LABPROT", "INR" in the last 72 hours. ABG No results found for: "PHART", "HCO3", "TCO2", "ACIDBASEDEF", "O2SAT" CBG (last 3)  No results for input(s): "GLUCAP" in the last 72 hours.  Meds Scheduled Meds:  acetaminophen  1,000 mg Oral Q6H   Or   acetaminophen (TYLENOL) oral liquid 160 mg/5 mL  1,000 mg Oral Q6H   aspirin EC  81 mg Oral Daily   bisacodyl  10 mg Oral Daily   enoxaparin (LOVENOX) injection  40 mg Subcutaneous Daily   ketorolac  15 mg Intravenous Q6H   letrozole  2.5 mg Oral Daily    pantoprazole  40 mg Oral Daily   senna-docusate  1 tablet Oral QHS   Continuous Infusions: PRN Meds:.albuterol, morphine injection, ondansetron (ZOFRAN) IV, traMADol  Xrays DG Chest Port 1 View  Result Date: 10/18/2021 CLINICAL DATA:  Pneumothorax. Status post resection of right pulmonary nodule. EXAM: PORTABLE CHEST 1 VIEW COMPARISON:  Two-view chest x-ray 10/14/2021. FINDINGS: Heart size exaggerated by low lung volumes. A right-sided chest tube is in place. No significant pneumothorax is present. A small right pleural effusion is present. Patchy airspace opacities are present in the lower lobes bilaterally. IMPRESSION: 1. Right-sided chest tube without significant pneumothorax. 2. Small right pleural effusion. 3. Patchy bibasilar airspace disease likely reflects atelectasis. Electronically Signed   By: San Morelle M.D.   On: 10/18/2021 11:48   DG C-ARM BRONCHOSCOPY  Result Date: 10/18/2021 C-ARM BRONCHOSCOPY: Fluoroscopy was utilized by the requesting physician.  No radiographic interpretation.    Assessment/Plan: S/P Procedure(s) (LRB): XI ROBOTIC ASSISTED THORACOSCOPY-RIGHT UPPER LOBE WEDGE RESECTION (Right) INTERCOSTAL NERVE BLOCK (Right) POD #1  1 afebrile, systolic blood pressure 347Q to 170s, sinus rhythm/sinus bradycardia 2 oxygen saturations good on room air 3 chest tube-48 cc recorded, no air leak we will remove tube 4 excellent urine output 5 chest x-ray-tiny right apical pneumothorax, minor basilar atelectasis 6 normal renal function 7 minor reactive leukocytosis with white blood cell count 11.5 8 no anemia 9 routine pulmonary hygiene and  rehab modalities.  If she feels up to it could potentially go home this afternoon.    LOS: 1 day    John Giovanni PA-C Pager [phone] 10/19/2021  Will remove chest tube Home today  [place]

## 2021-10-19 NOTE — Progress Notes (Signed)
RN went over d/c summary w/ pt and NT is removing PIVs. Belongings w/ pt. NT will transport pt to private vehicle when valet pulls car around. Pt's husband to transport pt home.

## 2021-10-20 LAB — SURGICAL PATHOLOGY

## 2021-10-20 LAB — CYTOLOGY - NON PAP

## 2021-10-21 LAB — TYPE AND SCREEN
ABO/RH(D): A NEG
Antibody Screen: NEGATIVE
Unit division: 0
Unit division: 0

## 2021-10-21 LAB — BPAM RBC
Blood Product Expiration Date: 202310192359
Blood Product Expiration Date: 202310272359
Unit Type and Rh: 600
Unit Type and Rh: 600

## 2021-10-23 ENCOUNTER — Encounter (HOSPITAL_COMMUNITY): Payer: Self-pay | Admitting: Emergency Medicine

## 2021-10-25 NOTE — Progress Notes (Signed)
Patient with pulmonary nodule. PTT performed pre-operatively to assess for possible coagulopathy prior to surgical procedure.  RSB

## 2021-10-26 ENCOUNTER — Telehealth: Payer: Self-pay | Admitting: *Deleted

## 2021-10-26 NOTE — Telephone Encounter (Signed)
Received call from pt stating she was recently diagnosed with lung cancer and is requesting an appt with MD for second opinion on upcoming surgery and tx plan.  Appt scheduled, pt verbalized understanding of appt date and time.

## 2021-10-26 NOTE — Progress Notes (Signed)
     [address]       [place] [address]             [phone]       I had a conversation with [name] about her pathology results.  I informed her that the cancer was a primary lung cancer, and recommended that she get a completion lobectomy.  She is not sure if she wants to proceed with that.  She will call us back with her decision.  Jacole Capley Bary Leriche

## 2021-10-27 ENCOUNTER — Inpatient Hospital Stay: Payer: Medicare PPO | Attending: Hematology and Oncology | Admitting: Hematology and Oncology

## 2021-10-27 ENCOUNTER — Encounter: Payer: Self-pay | Admitting: *Deleted

## 2021-10-27 DIAGNOSIS — Z79899 Other long term (current) drug therapy: Secondary | ICD-10-CM | POA: Diagnosis not present

## 2021-10-27 DIAGNOSIS — Z7982 Long term (current) use of aspirin: Secondary | ICD-10-CM | POA: Insufficient documentation

## 2021-10-27 DIAGNOSIS — C50412 Malignant neoplasm of upper-outer quadrant of left female breast: Secondary | ICD-10-CM

## 2021-10-27 DIAGNOSIS — C3411 Malignant neoplasm of upper lobe, right bronchus or lung: Secondary | ICD-10-CM

## 2021-10-27 DIAGNOSIS — Z170 Estrogen receptor positive status [ER+]: Secondary | ICD-10-CM

## 2021-10-27 DIAGNOSIS — Z7902 Long term (current) use of antithrombotics/antiplatelets: Secondary | ICD-10-CM | POA: Insufficient documentation

## 2021-10-27 DIAGNOSIS — Z79811 Long term (current) use of aromatase inhibitors: Secondary | ICD-10-CM | POA: Diagnosis not present

## 2021-10-27 NOTE — Progress Notes (Signed)
Per MD request RN successfully faxed second opinion request to Dr. Lerry Paterson with [facility] [phone]).

## 2021-10-27 NOTE — Assessment & Plan Note (Signed)
12/19/16:Left Lumpectomy: Grade 1 IDC, 0.6 cm, Er 100%, PR 100%, Her 2 Neg, Ki 67: 3%, T1bN0 (stage 1A) Adjuvant radiation therapy 01/24/2017-02/20/2017  Treatment plan: Adjuvant antiestrogen therapy with letrozole 2.5 mg dailystarted3/01/2017

## 2021-10-27 NOTE — Assessment & Plan Note (Signed)
09/03/2021: PET CT scan: Posterior right upper lobe perifissural nodule stable without hypermetabolic activity.  No lymphadenopathy no metastatic disease. 10/18/2021: Right upper lobe lung wedge resection: Invasive well differentiated adenocarcinoma with focal lipidic spread 1.2 cm margins negative  T1b N0 M0 stage I A2

## 2021-10-27 NOTE — Progress Notes (Unsigned)
Patient Care Team: Antony Contras, MD as PCP - General (Family Medicine) Molli Posey, MD as Consulting Physician (Obstetrics and Gynecology) Nicholas Lose, MD as Consulting Physician (Hematology and Oncology) Eppie Gibson, MD as Attending Physician (Radiation Oncology) Rolm Bookbinder, MD as Consulting Physician (General Surgery)  DIAGNOSIS:  Encounter Diagnoses  Name Primary?   Malignant neoplasm of upper-outer quadrant of left breast in female, estrogen receptor positive ([place])    Primary cancer of right upper lobe of lung ([place])     SUMMARY OF ONCOLOGIC HISTORY: Oncology History  Malignant neoplasm of upper-outer quadrant of left breast in female, estrogen receptor positive ([place])  12/02/2016 Initial Diagnosis   IDC grade 1 ER/PR pos, Her 2 Neg   12/19/2016 Surgery   Left Lumpectomy: Grade 1 IDC, 0.6 cm, Er 100%, PR 100%, Her 2 Neg, Ki 67: 3%, T1bN0 (stage 1A)   01/24/2017 - 02/20/2017 Radiation Therapy   Adjuvant radiation therapy   03/15/2017 Genetic Testing    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 This gene mutation increases your risk to develop melanoma and kidney cancer.  There were no genetic changes in the following genes:   ALK, APC, ATM, AXIN2,BAP1,  BARD1, BLM, BMPR1A, BRCA1, BRCA2, BRIP1, CASR, CDC73, CDH1, CDK4, CDKN1B, CDKN1C, CDKN2A (p14ARF), CDKN2A (p16INK4a), CEBPA, CHEK2, CTNNA1, DICER1, DIS3L2, EGFR (c.2369C>T, p.Thr790Met variant only), EPCAM (Deletion/duplication testing only), FH, FLCN, GATA2, GPC3, GREM1 (Promoter region deletion/duplication testing only), HOXB13 (c.251G>A, p.Gly84Glu), HRAS, KIT, MAX, MEN1, MET, MLH1, MSH2, MSH3, MSH6, MUTYH, NBN, NF1, NF2, NTHL1, PALB2, PDGFRA, PHOX2B, PMS2, POLD1, POLE, POT1, PRKAR1A, PTCH1, PTEN, RAD50, RAD51C, RAD51D, RB1, RECQL4, RET, RUNX1, SDHAF2, SDHA (sequence changes only), SDHB, SDHC, SDHD, SMAD4, SMARCA4, SMARCB1, SMARCE1, STK11, SUFU, TERT, TERT, TMEM127, TP53, TSC1, TSC2,  VHL, WRN and WT1.         03/2017 -  Anti-estrogen oral therapy   Letrozole daily   Primary cancer of right upper lobe of lung ([place])  10/18/2021 Initial Diagnosis   Right upper lobe lung wedge resection: Invasive well-differentiated adenocarcinoma with focal lipidic spread 1.2 cm, margins negative, T1b N0 M0 stage I A2   10/27/2021 Cancer Staging   Staging form: Lung, AJCC 8th Edition - Clinical: Stage IA2 (cT1b, cN0, cM0) - Signed by Nicholas Lose, MD on 10/27/2021 Stage prefix: Initial diagnosis Histologic grade (G): G1 Histologic grading system: 4 grade system     CHIEF COMPLIANT: Follow-up of left breast cancer surveillance on letrozole therapy and discuss scans  INTERVAL HISTORY: Virginia Lindsey is a 71 y.o. with above-mentioned history of left breast cancer treated with lumpectomy, radiation, and who is currently on letrozole therapy. She presents to the clinic today for a follow-up to discuss scans. She reports to the clinic that she had a recent diagnose with invasive lung cancer. She does states that she is numbed and wants to to know how long will it subside.   ALLERGIES:  is allergic to codeine, hydrocodone, ivp dye [iodinated contrast media], and latex.  MEDICATIONS:  Current Outpatient Medications  Medication Sig Dispense Refill   acetaminophen (TYLENOL) 500 MG tablet Take 2 tablets (1,000 mg total) by mouth every 6 (six) hours.     albuterol (VENTOLIN HFA) 108 (90 Base) MCG/ACT inhaler Inhale 1-2 puffs into the lungs every 6 (six) hours as needed for wheezing or shortness of breath.     aspirin EC 81 MG tablet Take 81 mg by mouth daily. Swallow whole.     cholecalciferol (VITAMIN D3) 25 MCG (1000 UNIT)  tablet Take 2 tablets (2,000 Units total) by mouth daily.     Cyanocobalamin (B-12) 1000 MCG TABS Take 1,000 mcg by mouth daily.     ibuprofen (ADVIL) 400 MG tablet Take 1 tablet (400 mg total) by mouth every 8 (eight) hours as needed for mild pain.     letrozole  (FEMARA) 2.5 MG tablet TAKE 1 TABLET BY MOUTH EVERY DAY 90 tablet 3   ticagrelor (BRILINTA) 90 MG TABS tablet Take 0.5 tablets (45 mg total) by mouth 2 (two) times daily.     No current facility-administered medications for this visit.    PHYSICAL EXAMINATION: ECOG PERFORMANCE STATUS: 1 - Symptomatic but completely ambulatory  Vitals:   10/27/21 1319  BP: (!) 148/89  Pulse: 79  Resp: 18  Temp: (!) 97.3 F (36.3 C)  SpO2: 99%   Filed Weights   10/27/21 1319  Weight: 210 lb 9.6 oz (95.5 kg)     LABORATORY DATA:  I have reviewed the data as listed    Latest Ref Rng & Units 10/19/2021   12:14 AM 10/14/2021    9:00 AM 04/06/2021    3:35 AM  CMP  Glucose 70 - 99 mg/dL 145  96  116   BUN 8 - 23 mg/dL _0 Creatinine 0.44 - 1.00 mg/dL 1.00  0.96  0.77   Sodium 135 - 145 mmol/L 133  139  143   Potassium 3.5 - 5.1 mmol/L 3.9  3.3  3.5   Chloride 98 - 111 mmol/L 102  107  115   CO2 22 - 32 mmol/L _1 Calcium 8.9 - 10.3 mg/dL 8.9  9.2  8.4   Total Protein 6.5 - 8.1 g/dL  7.1    Total Bilirubin 0.3 - 1.2 mg/dL  0.8    Alkaline Phos 38 - 126 U/L  97    AST 15 - 41 U/L  17    ALT 0 - 44 U/L  18      Lab Results  Component Value Date   WBC 11.5 (H) 10/19/2021   HGB 12.6 10/19/2021   HCT 36.6 10/19/2021   MCV 91.5 10/19/2021   PLT 287 10/19/2021   NEUTROABS 6.5 04/06/2021    ASSESSMENT & PLAN:  Malignant neoplasm of upper-outer quadrant of left breast in female, estrogen receptor positive ([place]) 12/19/16: Left Lumpectomy: Grade 1 IDC, 0.6 cm, Er 100%, PR 100%, Her 2 Neg, Ki 67: 3%, T1bN0 (stage 1A) Adjuvant radiation therapy 01/24/2017-02/20/2017   Treatment plan: Adjuvant antiestrogen therapy with letrozole 2.5 mg daily started 03/03/2017  Primary cancer of right upper lobe of lung ([place]) 09/03/2021: PET CT scan: Posterior right upper lobe perifissural nodule stable without hypermetabolic activity.  No lymphadenopathy no metastatic disease. 10/18/2021: Right  upper lobe lung wedge resection: Invasive well differentiated adenocarcinoma with focal lipidic spread 1.2 cm margins negative  T1b N0 M0 stage I A2  Treatment plan: I discussed with the patient that her surgery is technically not complete because lymph nodes were not evaluated as well as lobectomy was not performed. She would like to get a second opinion on the surgical decision.  We will request [facility] for another opinion. She may be opting to do a mediastinoscopy to evaluate for mediastinal lymph nodes.   She has an appointment with Dr. Kipp Brood next week to discuss treatment Return to clinic in 3 weeks to discuss the final treatment plan.  No orders of the defined types  were placed in this encounter.  The patient has a good understanding of the overall plan. she agrees with it. she will call with any problems that may develop before the next visit here. Total time spent: 30 mins including face to face time and time spent for planning, charting and co-ordination of care   Harriette Ohara, MD 10/27/21    I Gardiner Coins am scribing for Dr. Lindi Adie  I have reviewed the above documentation for accuracy and completeness, and I agree with the above.

## 2021-10-28 ENCOUNTER — Other Ambulatory Visit: Payer: Self-pay | Admitting: *Deleted

## 2021-10-28 NOTE — Progress Notes (Signed)
The proposed treatment discussed in conference is for discussion purpose only and is not a binding recommendation.  The patients have not been physically examined, or presented with their treatment options.  Therefore, final treatment plans cannot be decided.  

## 2021-10-29 ENCOUNTER — Encounter (INDEPENDENT_AMBULATORY_CARE_PROVIDER_SITE_OTHER): Payer: Self-pay

## 2021-10-29 ENCOUNTER — Telehealth: Payer: Self-pay | Admitting: Hematology and Oncology

## 2021-10-29 DIAGNOSIS — Z4802 Encounter for removal of sutures: Secondary | ICD-10-CM

## 2021-10-29 NOTE — Telephone Encounter (Signed)
Scheduled appointment per 10/25 los. Patient is aware.

## 2021-11-01 NOTE — Progress Notes (Signed)
      [address]       [place] [address]             [phone]        Lilee H Murtagh [facility] Medical Record #[id number] Date of Birth: August 03, 1950  Referring: Margaretha Seeds, MD Primary Care: Antony Contras, MD Primary Cardiologist:None  Reason for visit:   follow-up  History of Present Illness:     71 year old female comes in for her first follow-up appointment.  She underwent a wedge resection for right upper lobe adenocarcinoma.  Margins were clear but close to the staple line.  She has no complaints  Physical Exam: BP (!) 157/97 (BP Location: Right Arm, Patient Position: Sitting)   Pulse 87   Resp 18   Ht 5\' 3"  (1.6 m)   Wt 211 lb (95.7 kg)   SpO2 95% Comment: RA  BMI 37.38 kg/m   Alert NAD Incision clean.   Abdomen, ND No peripheral edema   Diagnostic Studies & Laboratory data:  Path:  FINAL MICROSCOPIC DIAGNOSIS:   A.  RIGHT LUNG, UPPER LOBE, NODULE, WEDGE RESECTION:  Invasive well-differentiated adenocarcinoma with focal lepidic spread  Tumor measures 1.2 x 1.0 x 0.5 cm in greatest dimension (pT1b)  Margin free by 0.3 cm     Assessment / Plan:   71yo female s/p RUL wedge for well differentiated adenocarcinoma.  I recommended that she undergo completion right upper lobectomy.  I explained to her that with the margins being this close the chance of recurrence is higher.  She would like some time to consider this.  She will call us back to let us know her decision.  If she elects for surgery her Plavix will need to be held again for 5 days.     Lajuana Matte 11/05/2021 9:56 AM

## 2021-11-03 ENCOUNTER — Ambulatory Visit (INDEPENDENT_AMBULATORY_CARE_PROVIDER_SITE_OTHER): Payer: Self-pay | Admitting: Thoracic Surgery (Cardiothoracic Vascular Surgery)

## 2021-11-03 VITALS — BP 157/97 | HR 87 | Resp 18 | Ht 63.0 in | Wt 211.0 lb

## 2021-11-03 DIAGNOSIS — R911 Solitary pulmonary nodule: Secondary | ICD-10-CM

## 2021-11-03 DIAGNOSIS — Z9889 Other specified postprocedural states: Secondary | ICD-10-CM

## 2021-11-04 ENCOUNTER — Telehealth: Payer: Self-pay | Admitting: Pulmonary Disease

## 2021-11-04 DIAGNOSIS — Z79811 Long term (current) use of aromatase inhibitors: Secondary | ICD-10-CM | POA: Diagnosis not present

## 2021-11-04 DIAGNOSIS — Z803 Family history of malignant neoplasm of breast: Secondary | ICD-10-CM | POA: Diagnosis not present

## 2021-11-04 DIAGNOSIS — C50412 Malignant neoplasm of upper-outer quadrant of left female breast: Secondary | ICD-10-CM | POA: Diagnosis not present

## 2021-11-04 DIAGNOSIS — Z7982 Long term (current) use of aspirin: Secondary | ICD-10-CM | POA: Diagnosis not present

## 2021-11-04 DIAGNOSIS — R911 Solitary pulmonary nodule: Secondary | ICD-10-CM | POA: Diagnosis not present

## 2021-11-04 DIAGNOSIS — C3491 Malignant neoplasm of unspecified part of right bronchus or lung: Secondary | ICD-10-CM

## 2021-11-04 DIAGNOSIS — Z7722 Contact with and (suspected) exposure to environmental tobacco smoke (acute) (chronic): Secondary | ICD-10-CM | POA: Diagnosis not present

## 2021-11-04 DIAGNOSIS — Z170 Estrogen receptor positive status [ER+]: Secondary | ICD-10-CM | POA: Diagnosis not present

## 2021-11-04 DIAGNOSIS — Z79899 Other long term (current) drug therapy: Secondary | ICD-10-CM | POA: Diagnosis not present

## 2021-11-05 NOTE — Telephone Encounter (Signed)
Staging EBUS was not performed. Dr. Lamonte Sakai available for EBUS later this month. Can schedule if patient interested.  I will go ahead and place order for EBUS 11/22/21 as availability can be limited. She will need to hold her Brilinta starting 11/17/21.  Please also schedule patient for 30 minute slot with me to discuss during my clinic on 11/18/21.

## 2021-11-05 NOTE — Telephone Encounter (Signed)
Called and spoke with pt who stated that her oncologist Dr. Abigail Butts recommended pt having an EBUS performed to have lymph nodes checked. Pt had a bronch performed 10/16 by Dr., Lamonte Sakai.  Dr. Lamonte Sakai, please advise if lymph nodes might've been checked when the prior bronch was performed. Routing this to Dr. Loanne Drilling as well as this is her pt.

## 2021-11-05 NOTE — Telephone Encounter (Signed)
So I need to schedule the EBUS ordered for 11/22/21 by you correct?

## 2021-11-05 NOTE — Telephone Encounter (Signed)
So I need to cancel the EBUS ordered for 11/22/21?

## 2021-11-05 NOTE — Telephone Encounter (Signed)
Spoke with pt who does want to go ahead with the EBUS on 11/22/21. Pt will come into office on 11/18/21 if needs but I am not able to schedule OV for Dr. Cordelia Pen scheduled is not open.   Routing to Dr. Loanne Drilling as FYI  Routing to [facility] pool to schedule EBUS.

## 2021-11-05 NOTE — Telephone Encounter (Addendum)
Yes, EBUS procedure to be scheduled on 11/22/21 with Dr. Lamonte Sakai unless sooner availability

## 2021-11-08 NOTE — Telephone Encounter (Signed)
I verified with Colletta Maryland in scheduling and Nicola Girt in Endo that there are not any openings on 11/20.  Dr Lamonte Sakai when do you want me to schedule this?

## 2021-11-08 NOTE — Telephone Encounter (Signed)
It looks like my next available opening is actually 11/27. I am happy to schedule then or we can see if NM or BI can do sooner.

## 2021-11-09 ENCOUNTER — Ambulatory Visit
Admission: RE | Admit: 2021-11-09 | Discharge: 2021-11-09 | Disposition: A | Discharge status: Home / Self Care | Payer: Medicare PPO | Source: Ambulatory Visit | Attending: Obstetrics and Gynecology | Admitting: Obstetrics and Gynecology

## 2021-11-09 ENCOUNTER — Inpatient Hospital Stay: Payer: Medicare PPO | Attending: Hematology and Oncology | Admitting: Hematology and Oncology

## 2021-11-09 ENCOUNTER — Other Ambulatory Visit: Payer: Self-pay | Admitting: Obstetrics and Gynecology

## 2021-11-09 ENCOUNTER — Telehealth: Payer: Self-pay

## 2021-11-09 ENCOUNTER — Inpatient Hospital Stay: Payer: Medicare PPO

## 2021-11-09 VITALS — BP 151/96 | HR 67 | Temp 97.5°F | Resp 18 | Ht 63.0 in | Wt 213.3 lb

## 2021-11-09 DIAGNOSIS — Z170 Estrogen receptor positive status [ER+]: Secondary | ICD-10-CM | POA: Insufficient documentation

## 2021-11-09 DIAGNOSIS — Z79811 Long term (current) use of aromatase inhibitors: Secondary | ICD-10-CM | POA: Diagnosis not present

## 2021-11-09 DIAGNOSIS — Z923 Personal history of irradiation: Secondary | ICD-10-CM | POA: Diagnosis not present

## 2021-11-09 DIAGNOSIS — N641 Fat necrosis of breast: Secondary | ICD-10-CM

## 2021-11-09 DIAGNOSIS — Z7902 Long term (current) use of antithrombotics/antiplatelets: Secondary | ICD-10-CM | POA: Diagnosis not present

## 2021-11-09 DIAGNOSIS — Z79899 Other long term (current) drug therapy: Secondary | ICD-10-CM | POA: Insufficient documentation

## 2021-11-09 DIAGNOSIS — C3411 Malignant neoplasm of upper lobe, right bronchus or lung: Secondary | ICD-10-CM | POA: Insufficient documentation

## 2021-11-09 DIAGNOSIS — R92322 Mammographic fibroglandular density, left breast: Secondary | ICD-10-CM | POA: Diagnosis not present

## 2021-11-09 DIAGNOSIS — C50412 Malignant neoplasm of upper-outer quadrant of left female breast: Secondary | ICD-10-CM | POA: Diagnosis not present

## 2021-11-09 DIAGNOSIS — N6012 Diffuse cystic mastopathy of left breast: Secondary | ICD-10-CM | POA: Diagnosis not present

## 2021-11-09 DIAGNOSIS — Z7982 Long term (current) use of aspirin: Secondary | ICD-10-CM | POA: Insufficient documentation

## 2021-11-09 LAB — URINALYSIS, COMPLETE (UACMP) WITH MICROSCOPIC
Bilirubin Urine: NEGATIVE
Glucose, UA: NEGATIVE mg/dL
Hgb urine dipstick: NEGATIVE
Ketones, ur: NEGATIVE mg/dL
Nitrite: POSITIVE — AB
Protein, ur: 30 mg/dL — AB
Specific Gravity, Urine: 1.023 (ref 1.005–1.030)
WBC, UA: >50 WBC/hpf — ABNORMAL HIGH (ref 0–5)
pH: 5 (ref 5.0–8.0)

## 2021-11-09 MED ORDER — CIPROFLOXACIN HCL 250 MG PO TABS: 250.0000 mg | ORAL_TABLET | Freq: Two times a day (BID) | ORAL | 0 refills | Status: AC

## 2021-11-09 NOTE — Telephone Encounter (Signed)
-----   Message from Gardenia Phlegm, NP sent at 11/09/2021  3:24 PM EST ----- Patient is positive for UTI, recommend cipro 250mg  po x 5 days disp 10 no refills ----- Message ----- From: Buel Ream, Lab [facility] [place] Sent: 11/09/2021  12:31 PM EST To: Nicholas Lose, MD

## 2021-11-09 NOTE — Assessment & Plan Note (Deleted)
09/03/2021: PET CT scan: Posterior right upper lobe perifissural nodule stable without hypermetabolic activity.  No lymphadenopathy no metastatic disease. 10/18/2021: Right upper lobe lung wedge resection: Invasive well differentiated adenocarcinoma with focal lipidic spread 1.2 cm margins negative  T1b N0 M0 stage I A2  Treatment plan: Patient is seeing Dr. Lamonte Sakai to discuss mediastinoscopy Patient is seeing Dr. Kipp Brood for surgery  Return to clinic after surgery

## 2021-11-09 NOTE — Assessment & Plan Note (Signed)
12/19/16: Left Lumpectomy: Grade 1 IDC, 0.6 cm, Er 100%, PR 100%, Her 2 Neg, Ki 67: 3%, T1bN0 (stage 1A) Adjuvant radiation therapy 01/24/2017-02/20/2017   Treatment plan: Adjuvant antiestrogen therapy with letrozole 2.5 mg daily started 03/03/2017 Letrozole toxicities: Tolerating it extremely well Breast cancer surveillance: Mammogram and ultrasound scheduled for today 11/09/2021

## 2021-11-09 NOTE — Telephone Encounter (Signed)
Do you have any availability for a staging EBUS ? This is a request by [facility]. I do not have any time in the next two weeks and hoping you guys may have time. If not, Rob has offered to do 11/29/21.

## 2021-11-09 NOTE — Telephone Encounter (Signed)
Called pt to make aware of results and recommendation. Medication ordered per NP. Pt was educated on use and indications of med. Knows to call with any concerns.

## 2021-11-09 NOTE — Progress Notes (Signed)
Patient Care Team: Nicholas Lose, MD as PCP - General (Hematology and Oncology) Molli Posey, MD as Consulting Physician (Obstetrics and Gynecology) Nicholas Lose, MD as Consulting Physician (Hematology and Oncology) Eppie Gibson, MD as Attending Physician (Radiation Oncology) Rolm Bookbinder, MD as Consulting Physician (General Surgery)  DIAGNOSIS:  Encounter Diagnoses  Name Primary?   Malignant neoplasm of upper-outer quadrant of left breast in female, estrogen receptor positive ([place]) Yes   Primary cancer of right upper lobe of lung ([place])     SUMMARY OF ONCOLOGIC HISTORY: Oncology History  Malignant neoplasm of upper-outer quadrant of left breast in female, estrogen receptor positive ([place])  12/02/2016 Initial Diagnosis   IDC grade 1 ER/PR pos, Her 2 Neg   12/19/2016 Surgery   Left Lumpectomy: Grade 1 IDC, 0.6 cm, Er 100%, PR 100%, Her 2 Neg, Ki 67: 3%, T1bN0 (stage 1A)   01/24/2017 - 02/20/2017 Radiation Therapy   Adjuvant radiation therapy   03/15/2017 Genetic Testing    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 This gene mutation increases your risk to develop melanoma and kidney cancer.  There were no genetic changes in the following genes:   ALK, APC, ATM, AXIN2,BAP1,  BARD1, BLM, BMPR1A, BRCA1, BRCA2, BRIP1, CASR, CDC73, CDH1, CDK4, CDKN1B, CDKN1C, CDKN2A (p14ARF), CDKN2A (p16INK4a), CEBPA, CHEK2, CTNNA1, DICER1, DIS3L2, EGFR (c.2369C>T, p.Thr790Met variant only), EPCAM (Deletion/duplication testing only), FH, FLCN, GATA2, GPC3, GREM1 (Promoter region deletion/duplication testing only), HOXB13 (c.251G>A, p.Gly84Glu), HRAS, KIT, MAX, MEN1, MET, MLH1, MSH2, MSH3, MSH6, MUTYH, NBN, NF1, NF2, NTHL1, PALB2, PDGFRA, PHOX2B, PMS2, POLD1, POLE, POT1, PRKAR1A, PTCH1, PTEN, RAD50, RAD51C, RAD51D, RB1, RECQL4, RET, RUNX1, SDHAF2, SDHA (sequence changes only), SDHB, SDHC, SDHD, SMAD4, SMARCA4, SMARCB1, SMARCE1, STK11, SUFU, TERT, TERT, TMEM127, TP53,  TSC1, TSC2, VHL, WRN and WT1.         03/2017 -  Anti-estrogen oral therapy   Letrozole daily   Primary cancer of right upper lobe of lung ([place])  10/18/2021 Initial Diagnosis   Right upper lobe lung wedge resection: Invasive well-differentiated adenocarcinoma with focal lipidic spread 1.2 cm, margins negative, T1b N0 M0 stage I A2   10/27/2021 Cancer Staging   Staging form: Lung, AJCC 8th Edition - Clinical: Stage IA2 (cT1b, cN0, cM0) - Signed by Nicholas Lose, MD on 10/27/2021 Stage prefix: Initial diagnosis Histologic grade (G): G1 Histologic grading system: 4 grade system     CHIEF COMPLIANT: follow-up of lung cancer to discuss final treatment plan  INTERVAL HISTORY: Virginia Lindsey is a  71 y.o. with above-mentioned history of left breast cancer and newly diagnosed lung cancer status post wedge resection on 10/18/2021.  She went to see Dr. Elenor Quinones at [facility] to have a second opinion on surgical options.  Patient personally does not want to undergo lobectomy.  She is seeing Dr. Lamonte Sakai with pulmonary to discuss EBUS to sample the mediastinal lymph nodes.  She really wishes to go on surveillance CT scans and not to any further surgery.  She has an appointment to see Dr. Kipp Brood in the coming weeks.  She is also going to see Dr. Lamonte Sakai as well.  ALLERGIES:  is allergic to codeine, hydrocodone, ivp dye [iodinated contrast media], and latex.  MEDICATIONS:  Current Outpatient Medications  Medication Sig Dispense Refill   aspirin EC 81 MG tablet Take 81 mg by mouth daily. Swallow whole.     cholecalciferol (VITAMIN D3) 25 MCG (1000 UNIT) tablet Take 2 tablets (2,000 Units total) by mouth daily.  Cyanocobalamin (B-12) 1000 MCG TABS Take 1,000 mcg by mouth daily.     letrozole (FEMARA) 2.5 MG tablet TAKE 1 TABLET BY MOUTH EVERY DAY 90 tablet 3   ticagrelor (BRILINTA) 90 MG TABS tablet Take 0.5 tablets (45 mg total) by mouth 2 (two) times daily.     No current  facility-administered medications for this visit.    PHYSICAL EXAMINATION: ECOG PERFORMANCE STATUS: 1 - Symptomatic but completely ambulatory  Vitals:   11/09/21 1023  BP: (!) 151/96  Pulse: 67  Resp: 18  Temp: (!) 97.5 F (36.4 C)  SpO2: 99%   Filed Weights   11/09/21 1023  Weight: 213 lb 4.8 oz (96.8 kg)     LABORATORY DATA:  I have reviewed the data as listed    Latest Ref Rng & Units 10/19/2021   12:14 AM 10/14/2021    9:00 AM 04/06/2021    3:35 AM  CMP  Glucose 70 - 99 mg/dL 145  96  116   BUN 8 - 23 mg/dL _0 Creatinine 0.44 - 1.00 mg/dL 1.00  0.96  0.77   Sodium 135 - 145 mmol/L 133  139  143   Potassium 3.5 - 5.1 mmol/L 3.9  3.3  3.5   Chloride 98 - 111 mmol/L 102  107  115   CO2 22 - 32 mmol/L _1 Calcium 8.9 - 10.3 mg/dL 8.9  9.2  8.4   Total Protein 6.5 - 8.1 g/dL  7.1    Total Bilirubin 0.3 - 1.2 mg/dL  0.8    Alkaline Phos 38 - 126 U/L  97    AST 15 - 41 U/L  17    ALT 0 - 44 U/L  18      Lab Results  Component Value Date   WBC 11.5 (H) 10/19/2021   HGB 12.6 10/19/2021   HCT 36.6 10/19/2021   MCV 91.5 10/19/2021   PLT 287 10/19/2021   NEUTROABS 6.5 04/06/2021    ASSESSMENT & PLAN:  Malignant neoplasm of upper-outer quadrant of left breast in female, estrogen receptor positive ([place]) 12/19/16: Left Lumpectomy: Grade 1 IDC, 0.6 cm, Er 100%, PR 100%, Her 2 Neg, Ki 67: 3%, T1bN0 (stage 1A) Adjuvant radiation therapy 01/24/2017-02/20/2017   Treatment plan: Adjuvant antiestrogen therapy with letrozole 2.5 mg daily started 03/03/2017 Letrozole toxicities: Tolerating it extremely well Breast cancer surveillance: Mammogram and ultrasound scheduled for today 11/09/2021    Primary cancer of right upper lobe of lung ([place]) 09/03/2021: PET CT scan: Posterior right upper lobe perifissural nodule stable without hypermetabolic activity.  No lymphadenopathy no metastatic disease. 10/18/2021: Right upper lobe lung wedge resection: Invasive well  differentiated adenocarcinoma with focal lipidic spread 1.2 cm margins negative  T1b N0 M0 stage I A2  Treatment plan: Patient is seeing Dr. Lamonte Sakai to discuss EBUS to evaluate the mediastinal lymph nodes. Patient is seeing Dr. Kipp Brood for surgery  Patient is not keen on doing lobectomy although she understands that lobectomy is a standard of care for her lung cancer. She wants to consider watchful monitoring with periodic CT scans. We will obtain a CT chest in 2 months and follow-up after that.   Orders Placed This Encounter  Procedures   CT Chest W Contrast    Standing Status:   Future    Standing Expiration Date:   11/09/2022    Order Specific Question:   If indicated for the ordered procedure, I authorize the  administration of contrast media per Radiology protocol    Answer:   Yes    Order Specific Question:   Preferred imaging location?    Answer:   [facility]   Urinalysis, Complete w Microscopic    Standing Status:   Future    Standing Expiration Date:   11/10/2022   The patient has a good understanding of the overall plan. she agrees with it. she will call with any problems that may develop before the next visit here. Total time spent: 30 mins including face to face time and time spent for planning, charting and co-ordination of care   Harriette Ohara, MD 11/09/21    I Gardiner Coins am scribing for Dr. Lindi Adie  I have reviewed the above documentation for accuracy and completeness, and I agree with the above.

## 2021-11-09 NOTE — Telephone Encounter (Signed)
I technically could have done this week but since on plavix will need to wait till 11/16 or 11/17. Do you want me to go ahead and try to schedule on one of these two days?

## 2021-11-10 ENCOUNTER — Encounter: Payer: Self-pay | Admitting: Emergency Medicine

## 2021-11-10 NOTE — Telephone Encounter (Signed)
Pt has been scheduled for 11/27 for bronch with RB.  Estill Bamberg went ahead and scheduled ov for 11/22.  I have spoken to pt & gave her appt info.

## 2021-11-10 NOTE — Telephone Encounter (Signed)
Thank you everyone for scheduling the appointments!

## 2021-11-10 NOTE — Telephone Encounter (Signed)
I contacted patient and she prefers Dr. Lamonte Sakai and would like to proceed with 11/29/21 (if still available) for a staging EBUS.  Dr. Lamonte Sakai, patient would also like to switch appointment from me to you so she can ask additional questions regarding the procedure.  Plan --A [facility] please schedule EBUS on 11/29/21 with Dr. Lamonte Sakai. Orders should be in but will need to change date. --Mel Almond, once EBUS scheduled, please cancel appointment with me and reschedule patient with Dr. Lamonte Sakai prior to procedure

## 2021-11-17 ENCOUNTER — Ambulatory Visit: Payer: Medicare PPO | Admitting: Hematology and Oncology

## 2021-11-23 ENCOUNTER — Other Ambulatory Visit: Payer: Self-pay

## 2021-11-23 ENCOUNTER — Encounter (HOSPITAL_COMMUNITY): Payer: Self-pay | Admitting: Emergency Medicine

## 2021-11-23 NOTE — Progress Notes (Signed)
Spoke with pt for pre-op call. Pt denies cardiac history, HTN or Diabetes. 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   Shower instructions given to pt and she voiced understanding  Covid test to done DOS.

## 2021-11-24 ENCOUNTER — Ambulatory Visit: Payer: Medicare PPO | Admitting: Emergency Medicine

## 2021-11-24 ENCOUNTER — Encounter: Payer: Self-pay | Admitting: Emergency Medicine

## 2021-11-24 ENCOUNTER — Telehealth: Payer: Self-pay

## 2021-11-24 ENCOUNTER — Other Ambulatory Visit: Payer: Self-pay

## 2021-11-24 VITALS — BP 124/70 | HR 77 | Temp 98.8°F | Ht 63.0 in | Wt 214.0 lb

## 2021-11-24 DIAGNOSIS — C3411 Malignant neoplasm of upper lobe, right bronchus or lung: Secondary | ICD-10-CM

## 2021-11-24 DIAGNOSIS — Z170 Estrogen receptor positive status [ER+]: Secondary | ICD-10-CM

## 2021-11-24 NOTE — Progress Notes (Signed)
Subjective:    Patient ID: Virginia Lindsey, female    DOB: [birth year], 71 y.o.   MRN: [id number]   HPI 71 year old never smoker with a history of breast cancer.  I met her and evaluation of a slowly growing irregularly shaped right upper lobe pulmonary nodule that was increasing in size on serial chest imaging.  Also some mild mediastinal adenopathy.  There was no PET hypermetabolism in the nodule or in the mediastinum on scan performed 09/03/2021.  She underwent navigational bronchoscopy with dye marking and then wedge resection by Dr. Kipp Brood on 10/18/2021.  Surgical pathology showed an invasive well-differentiated adenocarcinoma with lipidic spread, clear margins.  She is being considered for possible completion right upper lobectomy. She is also planning to follow with Dr Lindi Adie.    Review of Systems As per HPI  Past Medical History:  Diagnosis Date   Arthritis    Back pain    Breast cancer ([place]) 2018   left   COVID 2020   mild   Dyspnea    with walking a distance   Family history of breast cancer    Family history of melanoma    Family history of renal cancer    Palpitations    able to get them under control by deep breathing   Personal history of radiation therapy    PONV (postoperative nausea and vomiting)    Racing heart beat    per pt=going on for years, PCP aware per pt.   Seizures ([place])    last seizure 2004  3 total 10 yrs apart   Sleep apnea    Dx 20-30 years ago, does not use CPAP-sleeps with head elevated.   Umbilical hernia    Wears glasses      Family History  Problem Relation Age of Onset   Asthma Mother    Cancer Mother        renal cell carcinoma, dx in her 6s, d. 93   Cancer Father 46       melanoma on face; d. 28   Diabetes Maternal Grandmother    Breast cancer Sister 64       d. 85   Breast cancer Paternal Aunt 14       dbl mastectomy   Cancer Paternal Uncle        NOS   Melanoma Paternal Aunt    Breast cancer Paternal Aunt    Melanoma  Cousin        pat first cousin; daughter of aunt with melanoma   Breast cancer Cousin    Lymphoma Son 4       [place] Lymphoma     Social History   Socioeconomic History   Marital status: Married    Spouse name: Not on file   Number of children: 4   Years of education: Not on file   Highest education level: Not on file  Occupational History   Not on file  Tobacco Use   Smoking status: Never   Smokeless tobacco: Never  Vaping Use   Vaping Use: Never used  Substance and Sexual Activity   Alcohol use: No   Drug use: No   Sexual activity: Not on file  Other Topics Concern   Not on file  Social History Narrative   Not on file   Social Determinants of Health   Financial Resource Strain: Not on file  Food Insecurity: Not on file  Transportation Needs: Not on file  Physical Activity: Not on file  Stress: Not on file  Social Connections: Not on file  Intimate Partner Violence: Not on file     Allergies  Allergen Reactions   Codeine Nausea And Vomiting   Hydrocodone Nausea And Vomiting    Also lightheaded and disoriented   Latex Rash     Outpatient Medications Prior to Visit  Medication Sig Dispense Refill   aspirin EC 81 MG tablet Take 81 mg by mouth daily. Swallow whole.     cholecalciferol (VITAMIN D3) 25 MCG (1000 UNIT) tablet Take 2 tablets (2,000 Units total) by mouth daily.     Cyanocobalamin (B-12) 1000 MCG TABS Take 1,000 mcg by mouth daily.     letrozole (FEMARA) 2.5 MG tablet TAKE 1 TABLET BY MOUTH EVERY DAY 90 tablet 3   ticagrelor (BRILINTA) 90 MG TABS tablet Take 0.5 tablets (45 mg total) by mouth 2 (two) times daily.     No facility-administered medications prior to visit.        Objective:   Physical Exam Vitals:   11/24/21 1252  BP: 124/70  Pulse: 77  Temp: 98.8 F (37.1 C)  TempSrc: Oral  SpO2: 97%  Weight: 214 lb (97.1 kg)  Height: _0  (1.6 m)   Gen: Pleasant, well-nourished, in no distress,  normal affect  ENT: No lesions,   mouth clear,  oropharynx clear, no postnasal drip  Neck: No JVD, no stridor  Lungs: No use of accessory muscles, no crackles or wheezing on normal respiration, no wheeze on forced expiration  Cardiovascular: RRR, heart sounds normal, no murmur or gallops, no peripheral edema  Musculoskeletal: No deformities, no cyanosis or clubbing  Neuro: alert, awake, non focal  Skin: Warm, no lesions or rash       Assessment & Plan:  Primary cancer of right upper lobe of lung ([place]) I reviewed the patient's pathology results, scans with her today.  I did not perform endobronchial ultrasound at the time of her navigational bronchoscopy and wedge resection because her mediastinal lymphadenopathy had resolved.  While not unreasonable to perform EBUS now for definitive nodal staging, likely low yield in absence of any radiographic enlargement.  I think it would be most reasonable to repeat her CT chest, see if there is any (mild) lymphadenopathy.  If so I would proceed with EBUS.  She is planning to see Dr. Lindi Adie and have repeat imaging in January.  We can decide whether EBUS would be beneficial after that scan. I did also talk to her about the statistical benefit of lobectomy versus wedge resection.  He has spoken to her about this as well.  For now she is going to pursue watchful waiting.   Baltazar Apo, MD, PhD 11/24/2021, 1:31 PM [facility] [phone] or if no answer before 7:00PM call [phone] For any issues after 7:00PM please call eLink [phone]

## 2021-11-24 NOTE — Assessment & Plan Note (Signed)
I reviewed the patient's pathology results, scans with her today.  I did not perform endobronchial ultrasound at the time of her navigational bronchoscopy and wedge resection because her mediastinal lymphadenopathy had resolved.  While not unreasonable to perform EBUS now for definitive nodal staging, likely low yield in absence of any radiographic enlargement.  I think it would be most reasonable to repeat her CT chest, see if there is any (mild) lymphadenopathy.  If so I would proceed with EBUS.  She is planning to see Dr. Lindi Adie and have repeat imaging in January.  We can decide whether EBUS would be beneficial after that scan. I did also talk to her about the statistical benefit of lobectomy versus wedge resection.  He has spoken to her about this as well.  For now she is going to pursue watchful waiting.

## 2021-11-24 NOTE — Telephone Encounter (Signed)
Virginia Lindsey called to ask if she was to have CT chest prior to MD appt in January. Advised Virginia Lindsey order was in and she was provided with number to central scheduling. Scheduled Virginia Lindsey for labs 12/03/21. Virginia Lindsey states she had bronchoscopy and Dr Lamonte Sakai states EBUS is not necessary.

## 2021-11-24 NOTE — Patient Instructions (Signed)
We reviewed your pathology results, scan results today. We will plan to postpone your endobronchial ultrasound that is currently scheduled for 11/29/2021. Get your repeat CT scan of the chest and follow-up with Dr. Lindi Adie as planned. Depending on results and discussion with Dr. Lindi Adie, Dr. Kipp Brood we will decide if you need any other work-up including repeat bronchoscopy with EBUS. Follow Dr. Lamonte Sakai in late January 2024

## 2021-11-29 ENCOUNTER — Ambulatory Visit (HOSPITAL_COMMUNITY): Admission: RE | Admit: 2021-11-29 | Payer: Medicare PPO | Source: Ambulatory Visit | Admitting: Emergency Medicine

## 2021-11-29 HISTORY — DX: Dyspnea, unspecified: R06.00

## 2021-11-29 HISTORY — DX: Unspecified osteoarthritis, unspecified site: M19.90

## 2021-11-29 HISTORY — DX: Palpitations: R00.2

## 2021-11-29 SURGERY — BRONCHOSCOPY, WITH EBUS
Anesthesia: General

## 2021-12-01 NOTE — Progress Notes (Signed)
      [address]       [place] [address]             [phone]        Cherie H [place] Medical Record #[id number] Date of Birth: 08/03/1950  Referring: Antony Contras, MD Primary Care: Nicholas Lose, MD Primary Cardiologist:None  Reason for visit:   follow-up  History of Present Illness:     71 year old female presents for 1 month follow-up appointment.  She previously underwent a robotic assisted wedge resection for non-small cell lung cancer.  She has been indecisive about returning now for completion lobectomy.    Physical Exam: There were no vitals taken for this visit.  Alert NAD Incision clean.   Abdomen, ND No peripheral edema   Diagnostic Studies & Laboratory data: CXR: Clear     Assessment / Plan:   71 year old female status post a wedge resection of a 1.2 cm right upper lobe well-differentiated adenocarcinoma.  The cancer was 0.3 cm away from the margin.  I have recommended that she undergo completion lobectomy.  Patient has decided to continue with surveillance.  She will contact us if she changes her mind.   Lajuana Matte 12/01/2021 9:41 AM

## 2021-12-02 ENCOUNTER — Other Ambulatory Visit: Payer: Self-pay | Admitting: Thoracic Surgery (Cardiothoracic Vascular Surgery)

## 2021-12-02 DIAGNOSIS — C3490 Malignant neoplasm of unspecified part of unspecified bronchus or lung: Secondary | ICD-10-CM

## 2021-12-03 ENCOUNTER — Inpatient Hospital Stay: Payer: Medicare PPO

## 2021-12-03 ENCOUNTER — Ambulatory Visit (INDEPENDENT_AMBULATORY_CARE_PROVIDER_SITE_OTHER): Payer: Self-pay | Admitting: Thoracic Surgery (Cardiothoracic Vascular Surgery)

## 2021-12-03 ENCOUNTER — Ambulatory Visit
Admission: RE | Admit: 2021-12-03 | Discharge: 2021-12-03 | Disposition: A | Discharge status: Home / Self Care | Payer: Medicare PPO | Source: Ambulatory Visit | Attending: Thoracic Surgery (Cardiothoracic Vascular Surgery) | Admitting: Thoracic Surgery (Cardiothoracic Vascular Surgery)

## 2021-12-03 ENCOUNTER — Encounter: Payer: Self-pay | Admitting: Thoracic Surgery (Cardiothoracic Vascular Surgery)

## 2021-12-03 VITALS — BP 165/90 | HR 77 | Resp 20 | Ht 63.0 in | Wt 212.0 lb

## 2021-12-03 DIAGNOSIS — C3490 Malignant neoplasm of unspecified part of unspecified bronchus or lung: Secondary | ICD-10-CM | POA: Diagnosis not present

## 2021-12-03 DIAGNOSIS — I700 Atherosclerosis of aorta: Secondary | ICD-10-CM | POA: Diagnosis not present

## 2021-12-03 DIAGNOSIS — Z9889 Other specified postprocedural states: Secondary | ICD-10-CM | POA: Diagnosis not present

## 2021-12-03 DIAGNOSIS — C3411 Malignant neoplasm of upper lobe, right bronchus or lung: Secondary | ICD-10-CM

## 2021-12-07 ENCOUNTER — Ambulatory Visit (HOSPITAL_COMMUNITY): Payer: Medicare PPO

## 2021-12-14 DIAGNOSIS — M25561 Pain in right knee: Secondary | ICD-10-CM | POA: Diagnosis not present

## 2021-12-14 DIAGNOSIS — M25552 Pain in left hip: Secondary | ICD-10-CM | POA: Diagnosis not present

## 2022-01-04 NOTE — Progress Notes (Signed)
Patient Care Team: Antony Contras, MD as PCP - General (Family Medicine) Molli Posey, MD as Consulting Physician (Obstetrics and Gynecology) Nicholas Lose, MD as Consulting Physician (Hematology and Oncology) Eppie Gibson, MD as Attending Physician (Radiation Oncology) Rolm Bookbinder, MD as Consulting Physician (General Surgery)  DIAGNOSIS:  Encounter Diagnoses  Name Primary?   Malignant neoplasm of upper-outer quadrant of left breast in female, estrogen receptor positive ([place]) Yes   Primary cancer of right upper lobe of lung ([place])    Malignant neoplasm of unspecified part of unspecified bronchus or lung ([place])     SUMMARY OF ONCOLOGIC HISTORY: Oncology History  Malignant neoplasm of upper-outer quadrant of left breast in female, estrogen receptor positive (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 This gene mutation increases your risk to develop melanoma and kidney cancer.  There were no genetic changes in the following genes:   ALK, APC, ATM, AXIN2,BAP1,  BARD1, BLM, BMPR1A, BRCA1, BRCA2, BRIP1, CASR, CDC73, CDH1, CDK4, CDKN1B, CDKN1C, CDKN2A (p14ARF), CDKN2A (p16INK4a), CEBPA, CHEK2, CTNNA1, DICER1, DIS3L2, EGFR (c.2369C>T, p.Thr790Met variant only), EPCAM (Deletion/duplication testing only), FH, FLCN, GATA2, GPC3, GREM1 (Promoter region deletion/duplication testing only), HOXB13 (c.251G>A, p.Gly84Glu), HRAS, KIT, MAX, MEN1, MET, MLH1, MSH2, MSH3, MSH6, MUTYH, NBN, NF1, NF2, NTHL1, PALB2, PDGFRA, PHOX2B, PMS2, POLD1, POLE, POT1, PRKAR1A, PTCH1, PTEN, RAD50, RAD51C, RAD51D, RB1, RECQL4, RET, RUNX1, SDHAF2, SDHA (sequence changes only), SDHB, SDHC, SDHD,  SMAD4, SMARCA4, SMARCB1, SMARCE1, STK11, SUFU, TERT, TERT, TMEM127, TP53, TSC1, TSC2, VHL, WRN and WT1.         03/2017 -  Anti-estrogen oral therapy   Letrozole daily   Primary cancer of right upper lobe of lung ([place])  10/18/2021 Initial Diagnosis   Right upper lobe lung wedge resection: Invasive well-differentiated adenocarcinoma with focal lipidic spread 1.2 cm, margins negative, T1b N0 M0 stage I A2   10/27/2021 Cancer Staging   Staging form: Lung, AJCC 8th Edition - Clinical: Stage IA2 (cT1b, cN0, cM0) - Signed by Nicholas Lose, MD on 10/27/2021 Stage prefix: Initial diagnosis Histologic grade (G): G1 Histologic grading system: 4 grade system     CHIEF COMPLIANT: Left breast cancer/scans  INTERVAL HISTORY: Virginia Lindsey is a 72 y.o. with above-mentioned history of left breast cancer. She presents to the clinic for a follow-up to review scans.    ALLERGIES:  is allergic to codeine, hydrocodone, and latex.  MEDICATIONS:  Current Outpatient Medications  Medication Sig Dispense Refill   Turmeric (QC TUMERIC COMPLEX) 500 MG CAPS Take 500 mg by mouth daily.     aspirin EC 81 MG tablet Take 81 mg by mouth daily. Swallow whole.     cholecalciferol (VITAMIN D3) 25 MCG (1000 UNIT) tablet Take 2 tablets (2,000 Units total) by mouth daily.     Cyanocobalamin (B-12) 1000 MCG TABS Take 1,000 mcg by mouth daily.     letrozole (FEMARA) 2.5 MG tablet TAKE 1 TABLET BY MOUTH EVERY DAY 90 tablet 3   ticagrelor (BRILINTA) 90 MG TABS tablet Take 0.5 tablets (45 mg total) by mouth 2 (two) times daily.     No current facility-administered medications for this visit.  PHYSICAL EXAMINATION: ECOG PERFORMANCE STATUS: 1 - Symptomatic but completely ambulatory  Vitals:   01/13/22 0828 01/13/22 0830  BP: (!) 181/92 (!) 160/82  Pulse: 64   Resp: 16   Temp: 97.7 F (36.5 C)   SpO2: 100%    Filed Weights   01/13/22 0828  Weight: 214 lb (97.1 kg)      LABORATORY DATA:  I have  reviewed the data as listed    Latest Ref Rng & Units 01/07/2022   10:26 AM 10/19/2021   12:14 AM 10/14/2021    9:00 AM  CMP  Glucose 70 - 99 mg/dL 83  145  96   BUN 8 - 23 mg/dL _0 Creatinine 0.44 - 1.00 mg/dL 0.84  1.00  0.96   Sodium 135 - 145 mmol/L 142  133  139   Potassium 3.5 - 5.1 mmol/L 3.8  3.9  3.3   Chloride 98 - 111 mmol/L 107  102  107   CO2 22 - 32 mmol/L _1 Calcium 8.9 - 10.3 mg/dL 9.4  8.9  9.2   Total Protein 6.5 - 8.1 g/dL 7.0   7.1   Total Bilirubin 0.3 - 1.2 mg/dL 0.5   0.8   Alkaline Phos 38 - 126 U/L 98   97   AST 15 - 41 U/L 11   17   ALT 0 - 44 U/L 10   18     Lab Results  Component Value Date   WBC 7.0 01/07/2022   HGB 14.6 01/07/2022   HCT 42.7 01/07/2022   MCV 93.0 01/07/2022   PLT 311 01/07/2022   NEUTROABS 4.5 01/07/2022    ASSESSMENT & PLAN:  Malignant neoplasm of upper-outer quadrant of left breast in female, estrogen receptor positive ([place]) 12/19/16: Left Lumpectomy: Grade 1 IDC, 0.6 cm, Er 100%, PR 100%, Her 2 Neg, Ki 67: 3%, T1bN0 (stage 1A) Adjuvant radiation therapy 01/24/2017-02/20/2017   Treatment plan: Adjuvant antiestrogen therapy with letrozole 2.5 mg daily started 03/03/2017 Letrozole toxicities: Tolerating it extremely well Breast cancer surveillance: Mammogram and ultrasound left breast 11/09/2021: Findings suggestive of healing fat necrosis.  Left breast.  Next mammogram scheduled February 2024  Primary cancer of right upper lobe of lung ([place]) 09/03/2021: PET CT scan: Posterior right upper lobe perifissural nodule stable without hypermetabolic activity.  No lymphadenopathy no metastatic disease. 10/18/2021: Right upper lobe lung wedge resection: Invasive well differentiated adenocarcinoma with focal lipidic spread 1.2 cm margins negative  T1b N0 M0 stage I A2  Patient did not want to do lobectomy.  She is now on active surveillance CT chest 01/10/2022: No evidence of local recurrence.  Patchy groundglass micro  nodularity suggestive of hypersensitivity pneumonitis  Recheck CT scan in 3 months  Because of her high co-pays when she comes to see me, we will call her with the result of the CT scan. I would like to see her back in August and prior to that she will probably need another CT scan.  Joint pain: Patient is taking 9 tablets of turmeric a day and I asked her to check with her orthopedic doctor if she is supposed to take that many tablets.    Orders Placed This Encounter  Procedures   CT Chest W Contrast    Standing Status:   Future    Standing Expiration Date:   01/13/2023    Order Specific Question:   If indicated for the ordered procedure, I  authorize the administration of contrast media per Radiology protocol    Answer:   Yes    Order Specific Question:   Does the patient have a contrast media/X-ray dye allergy?    Answer:   No    Order Specific Question:   Preferred imaging location?    Answer:   [facility]    Order Specific Question:   Release to patient    Answer:   Immediate [1]   The patient has a good understanding of the overall plan. she agrees with it. she will call with any problems that may develop before the next visit here. Total time spent: 30 mins including face to face time and time spent for planning, charting and co-ordination of care   Harriette Ohara, MD 01/13/22    I Gardiner Coins am acting as a Education administrator for [employer]  I have reviewed the above documentation for accuracy and completeness, and I agree with the above.

## 2022-01-06 ENCOUNTER — Other Ambulatory Visit: Payer: Self-pay | Admitting: *Deleted

## 2022-01-06 DIAGNOSIS — Z170 Estrogen receptor positive status [ER+]: Secondary | ICD-10-CM

## 2022-01-07 ENCOUNTER — Inpatient Hospital Stay: Payer: Medicare PPO | Attending: Hematology and Oncology

## 2022-01-07 DIAGNOSIS — Z7902 Long term (current) use of antithrombotics/antiplatelets: Secondary | ICD-10-CM | POA: Insufficient documentation

## 2022-01-07 DIAGNOSIS — C3411 Malignant neoplasm of upper lobe, right bronchus or lung: Secondary | ICD-10-CM | POA: Insufficient documentation

## 2022-01-07 DIAGNOSIS — Z79811 Long term (current) use of aromatase inhibitors: Secondary | ICD-10-CM | POA: Insufficient documentation

## 2022-01-07 DIAGNOSIS — Z79899 Other long term (current) drug therapy: Secondary | ICD-10-CM | POA: Diagnosis not present

## 2022-01-07 DIAGNOSIS — C50412 Malignant neoplasm of upper-outer quadrant of left female breast: Secondary | ICD-10-CM | POA: Diagnosis not present

## 2022-01-07 DIAGNOSIS — Z923 Personal history of irradiation: Secondary | ICD-10-CM | POA: Insufficient documentation

## 2022-01-07 DIAGNOSIS — Z7982 Long term (current) use of aspirin: Secondary | ICD-10-CM | POA: Insufficient documentation

## 2022-01-07 DIAGNOSIS — Z170 Estrogen receptor positive status [ER+]: Secondary | ICD-10-CM

## 2022-01-07 LAB — CBC WITH DIFFERENTIAL (CANCER CENTER ONLY)
Abs Immature Granulocytes: 0.03 10*3/uL (ref 0.00–0.07)
Basophils Absolute: 0 10*3/uL (ref 0.0–0.1)
Basophils Relative: 1 %
Eosinophils Absolute: 0.1 10*3/uL (ref 0.0–0.5)
Eosinophils Relative: 2 %
HCT: 42.7 % (ref 36.0–46.0)
Hemoglobin: 14.6 g/dL (ref 12.0–15.0)
Immature Granulocytes: 0 %
Lymphocytes Relative: 25 %
Lymphs Abs: 1.7 10*3/uL (ref 0.7–4.0)
MCH: 31.8 pg (ref 26.0–34.0)
MCHC: 34.2 g/dL (ref 30.0–36.0)
MCV: 93 fL (ref 80.0–100.0)
Monocytes Absolute: 0.5 10*3/uL (ref 0.1–1.0)
Monocytes Relative: 8 %
Neutro Abs: 4.5 10*3/uL (ref 1.7–7.7)
Neutrophils Relative %: 64 %
Platelet Count: 311 10*3/uL (ref 150–400)
RBC: 4.59 MIL/uL (ref 3.87–5.11)
RDW: 12.6 % (ref 11.5–15.5)
WBC Count: 7 10*3/uL (ref 4.0–10.5)
nRBC: 0 % (ref 0.0–0.2)

## 2022-01-07 LAB — CMP (CANCER CENTER ONLY)
ALT: 10 U/L (ref 0–44)
AST: 11 U/L — ABNORMAL LOW (ref 15–41)
Albumin: 4.2 g/dL (ref 3.5–5.0)
Alkaline Phosphatase: 98 U/L (ref 38–126)
Anion gap: 6 (ref 5–15)
BUN: 16 mg/dL (ref 8–23)
CO2: 29 mmol/L (ref 22–32)
Calcium: 9.4 mg/dL (ref 8.9–10.3)
Chloride: 107 mmol/L (ref 98–111)
Creatinine: 0.84 mg/dL (ref 0.44–1.00)
GFR, Estimated: >60 mL/min (ref >60)
Glucose, Bld: 83 mg/dL (ref 70–99)
Potassium: 3.8 mmol/L (ref 3.5–5.1)
Sodium: 142 mmol/L (ref 135–145)
Total Bilirubin: 0.5 mg/dL (ref 0.3–1.2)
Total Protein: 7 g/dL (ref 6.5–8.1)

## 2022-01-10 ENCOUNTER — Ambulatory Visit (HOSPITAL_COMMUNITY)
Admission: RE | Admit: 2022-01-10 | Discharge: 2022-01-10 | Disposition: A | Discharge status: Home / Self Care | Payer: Medicare PPO | Source: Ambulatory Visit | Attending: Hematology and Oncology | Admitting: Hematology and Oncology

## 2022-01-10 DIAGNOSIS — Z170 Estrogen receptor positive status [ER+]: Secondary | ICD-10-CM | POA: Diagnosis not present

## 2022-01-10 DIAGNOSIS — C50412 Malignant neoplasm of upper-outer quadrant of left female breast: Secondary | ICD-10-CM | POA: Insufficient documentation

## 2022-01-10 DIAGNOSIS — R911 Solitary pulmonary nodule: Secondary | ICD-10-CM | POA: Diagnosis not present

## 2022-01-10 DIAGNOSIS — C3411 Malignant neoplasm of upper lobe, right bronchus or lung: Secondary | ICD-10-CM | POA: Diagnosis not present

## 2022-01-10 MED ORDER — IOHEXOL 300 MG/ML  SOLN
75.0000 mL | Freq: Once | INTRAMUSCULAR | Status: AC | PRN
  Administered 2022-01-10: 75 mL via INTRAVENOUS

## 2022-01-10 MED ORDER — SODIUM CHLORIDE (PF) 0.9 % IJ SOLN
INTRAMUSCULAR | Status: AC
  Filled 2022-01-10: qty 50

## 2022-01-11 DIAGNOSIS — M25561 Pain in right knee: Secondary | ICD-10-CM | POA: Diagnosis not present

## 2022-01-12 DIAGNOSIS — N390 Urinary tract infection, site not specified: Secondary | ICD-10-CM | POA: Diagnosis not present

## 2022-01-12 DIAGNOSIS — R300 Dysuria: Secondary | ICD-10-CM | POA: Diagnosis not present

## 2022-01-12 DIAGNOSIS — Z01419 Encounter for gynecological examination (general) (routine) without abnormal findings: Secondary | ICD-10-CM | POA: Diagnosis not present

## 2022-01-12 DIAGNOSIS — Z6837 Body mass index (BMI) 37.0-37.9, adult: Secondary | ICD-10-CM | POA: Diagnosis not present

## 2022-01-13 ENCOUNTER — Inpatient Hospital Stay (HOSPITAL_BASED_OUTPATIENT_CLINIC_OR_DEPARTMENT_OTHER): Payer: Medicare PPO | Admitting: Hematology and Oncology

## 2022-01-13 VITALS — BP 160/82 | HR 64 | Temp 97.7°F | Resp 16 | Wt 214.0 lb

## 2022-01-13 DIAGNOSIS — C3411 Malignant neoplasm of upper lobe, right bronchus or lung: Secondary | ICD-10-CM | POA: Diagnosis not present

## 2022-01-13 DIAGNOSIS — Z170 Estrogen receptor positive status [ER+]: Secondary | ICD-10-CM | POA: Diagnosis not present

## 2022-01-13 DIAGNOSIS — Z79899 Other long term (current) drug therapy: Secondary | ICD-10-CM | POA: Diagnosis not present

## 2022-01-13 DIAGNOSIS — C50412 Malignant neoplasm of upper-outer quadrant of left female breast: Secondary | ICD-10-CM

## 2022-01-13 DIAGNOSIS — Z79811 Long term (current) use of aromatase inhibitors: Secondary | ICD-10-CM | POA: Diagnosis not present

## 2022-01-13 DIAGNOSIS — C3490 Malignant neoplasm of unspecified part of unspecified bronchus or lung: Secondary | ICD-10-CM | POA: Diagnosis not present

## 2022-01-13 DIAGNOSIS — Z7902 Long term (current) use of antithrombotics/antiplatelets: Secondary | ICD-10-CM | POA: Diagnosis not present

## 2022-01-13 DIAGNOSIS — Z923 Personal history of irradiation: Secondary | ICD-10-CM | POA: Diagnosis not present

## 2022-01-13 DIAGNOSIS — Z7982 Long term (current) use of aspirin: Secondary | ICD-10-CM | POA: Diagnosis not present

## 2022-01-13 MED ORDER — TURMERIC 500 MG PO CAPS: 500.0000 mg | ORAL_CAPSULE | Freq: Every day | ORAL | Status: DC

## 2022-01-13 NOTE — Assessment & Plan Note (Signed)
12/19/16: Left Lumpectomy: Grade 1 IDC, 0.6 cm, Er 100%, PR 100%, Her 2 Neg, Ki 67: 3%, T1bN0 (stage 1A) Adjuvant radiation therapy 01/24/2017-02/20/2017   Treatment plan: Adjuvant antiestrogen therapy with letrozole 2.5 mg daily started 03/03/2017 Letrozole toxicities: Tolerating it extremely well Breast cancer surveillance: Mammogram and ultrasound left breast 11/09/2021: Findings suggestive of healing fat necrosis.  Left breast.  Next mammogram scheduled February 2024

## 2022-01-13 NOTE — Assessment & Plan Note (Signed)
09/03/2021: PET CT scan: Posterior right upper lobe perifissural nodule stable without hypermetabolic activity.  No lymphadenopathy no metastatic disease. 10/18/2021: Right upper lobe lung wedge resection: Invasive well differentiated adenocarcinoma with focal lipidic spread 1.2 cm margins negative  T1b N0 M0 stage I A2  Patient did not want to do lobectomy.  She is now on active surveillance CT chest 01/10/2022: No evidence of local recurrence.  Patchy groundglass micro nodularity suggestive of hypersensitivity pneumonitis  Recheck CT scan in 2 months and follow-up after that.

## 2022-01-17 ENCOUNTER — Other Ambulatory Visit (HOSPITAL_COMMUNITY): Payer: Self-pay | Admitting: Interventional Radiology

## 2022-01-17 DIAGNOSIS — I671 Cerebral aneurysm, nonruptured: Secondary | ICD-10-CM

## 2022-01-18 ENCOUNTER — Encounter: Payer: Self-pay | Admitting: Emergency Medicine

## 2022-01-18 ENCOUNTER — Ambulatory Visit: Payer: Medicare PPO | Admitting: Emergency Medicine

## 2022-01-18 VITALS — BP 128/74 | HR 70 | Temp 98.3°F | Ht 63.0 in | Wt 213.6 lb

## 2022-01-18 DIAGNOSIS — C3411 Malignant neoplasm of upper lobe, right bronchus or lung: Secondary | ICD-10-CM

## 2022-01-18 NOTE — Patient Instructions (Signed)
Please get your CT scan of the chest in April as planned, and reviewed with Dr. Pamelia Hoit Follow Dr. Pamelia Hoit in August Follow Dr. Delton Coombes in February 2025 or sooner if you have any problems.

## 2022-01-18 NOTE — Progress Notes (Signed)
Subjective:    Patient ID: Virginia Lindsey, female    DOB: [birth year], 72 y.o.   MRN: [id number]   HPI 72 year old never smoker with a history of breast cancer.  I met her and evaluation of a slowly growing irregularly shaped right upper lobe pulmonary nodule that was increasing in size on serial chest imaging.  Also some mild mediastinal adenopathy.  There was no PET hypermetabolism in the nodule or in the mediastinum on scan performed 09/03/2021.  She underwent navigational bronchoscopy with dye marking and then wedge resection by Dr. Kipp Brood on 10/18/2021.  Surgical pathology showed an invasive well-differentiated adenocarcinoma with lipidic spread, clear margins.  She is being considered for possible completion right upper lobectomy. She is also planning to follow with Dr Lindi Adie.    ROV 01/18/2022 --72 year old woman with a history of breast cancer whom I have seen for right upper lobe pulmonary nodule.  She underwent commendation dye marking and wedge resection 10/18/2021 and this showed an invasive well-differentiated adenocarcinoma with some lipidic spread with clear margins.  Some consideration regarding a completion segmentectomy or lobectomy.  This has not been done to thus far.  She had some scant mediastinal adenopathy that was not hypermetabolic.  We decided to repeat her CT scan of the chest to look for interval change and guide decision-making regarding whether she needs EBUS. She has been well. No cough. Mobility limited by knee pain  CT chest 01/10/2022 reviewed by me, shows no pathologically enlarged axillary mediastinal or hilar nodes, postsurgical scarring along the posterior right upper lobe wedge resection suture line without any suspicious findings for recurrence.  No evidence of distant disease.  There is some stable chronic faint centrilobular groundglass micronodularity bilaterally   Review of Systems As per HPI  Past Medical History:  Diagnosis Date   Arthritis    Back  pain    Breast cancer ([place]) 2018   left   COVID 2020   mild   Dyspnea    with walking a distance   Family history of breast cancer    Family history of melanoma    Family history of renal cancer    Palpitations    able to get them under control by deep breathing   Personal history of radiation therapy    PONV (postoperative nausea and vomiting)    Racing heart beat    per pt=going on for years, PCP aware per pt.   Seizures ([place])    last seizure 2004  3 total 10 yrs apart   Sleep apnea    Dx 20-30 years ago, does not use CPAP-sleeps with head elevated.   Umbilical hernia    Wears glasses      Family History  Problem Relation Age of Onset   Asthma Mother    Cancer Mother        renal cell carcinoma, dx in her 49s, d. 67   Cancer Father 62       melanoma on face; d. 7   Diabetes Maternal Grandmother    Breast cancer Sister 65       d. 17   Breast cancer Paternal Aunt 65       dbl mastectomy   Cancer Paternal Uncle        NOS   Melanoma Paternal Aunt    Breast cancer Paternal Aunt    Melanoma Cousin        pat first cousin; daughter of aunt with melanoma   Breast cancer Cousin  Lymphoma Son 4       [place] Lymphoma     Social History   Socioeconomic History   Marital status: Married    Spouse name: Not on file   Number of children: 4   Years of education: Not on file   Highest education level: Not on file  Occupational History   Not on file  Tobacco Use   Smoking status: Never   Smokeless tobacco: Never  Vaping Use   Vaping Use: Never used  Substance and Sexual Activity   Alcohol use: No   Drug use: No   Sexual activity: Not on file  Other Topics Concern   Not on file  Social History Narrative   Not on file   Social Determinants of Health   Financial Resource Strain: Not on file  Food Insecurity: Not on file  Transportation Needs: Not on file  Physical Activity: Not on file  Stress: Not on file  Social Connections: Not on file  Intimate  Partner Violence: Not on file     Allergies  Allergen Reactions   Codeine Nausea And Vomiting   Hydrocodone Nausea And Vomiting    Also lightheaded and disoriented   Latex Rash     Outpatient Medications Prior to Visit  Medication Sig Dispense Refill   aspirin EC 81 MG tablet Take 81 mg by mouth daily. Swallow whole.     cholecalciferol (VITAMIN D3) 25 MCG (1000 UNIT) tablet Take 2 tablets (2,000 Units total) by mouth daily.     Cyanocobalamin (B-12) 1000 MCG TABS Take 1,000 mcg by mouth daily.     Glucosamine-Chondroit-Vit C-Mn (GLUCOSAMINE 1500 COMPLEX) CAPS Take 1 capsule by mouth daily.     letrozole (FEMARA) 2.5 MG tablet TAKE 1 TABLET BY MOUTH EVERY DAY 90 tablet 3   ticagrelor (BRILINTA) 90 MG TABS tablet Take 0.5 tablets (45 mg total) by mouth 2 (two) times daily.     Turmeric (QC TUMERIC COMPLEX) 500 MG CAPS Take 500 mg by mouth daily.     No facility-administered medications prior to visit.        Objective:   Physical Exam Vitals:   01/18/22 0825  BP: 128/74  Pulse: 70  Temp: 98.3 F (36.8 C)  TempSrc: Oral  SpO2: 97%  Weight: 213 lb 9.6 oz (96.9 kg)  Height: 5\' 3"  (1.6 m)   Gen: Pleasant, well-nourished, in no distress,  normal affect  ENT: No lesions,  mouth clear,  oropharynx clear, no postnasal drip  Neck: No JVD, no stridor  Lungs: No use of accessory muscles, no crackles or wheezing on normal respiration, no wheeze on forced expiration  Cardiovascular: RRR, heart sounds normal, no murmur or gallops, no peripheral edema  Musculoskeletal: No deformities, no cyanosis or clubbing  Neuro: alert, awake, non focal  Skin: Warm, no lesions or rash       Assessment & Plan:  Primary cancer of right upper lobe of lung (HCC) Right upper lobe adenocarcinoma that was wedge resected.  No completion segmentectomy or lobectomy has been done.  The margins were negative.  She had some mild lymphadenopathy that now appears to be resolved and was likely not  clinically significant.  CT scan of the chest reviewed.  She has no pathologic lymphadenopathy, no indication to pursue endobronchial ultrasound and nodal biopsies at this time.  The surgical site looked good.  She has a 3 mm right lower lobe pulmonary nodule that will be followed.  Dr. Lindi Adie has arranged for a  repeat CT in April which is the 36-month mark.  Subsequent image timing to be determined depending on results.  She can follow-up with me in February 2025 or sooner if there is a need to based on her imaging.   Levy Pupa, MD, PhD 01/18/2022, 8:49 AM [facility] [phone] or if no answer before 7:00PM call [phone] For any issues after 7:00PM please call eLink [phone]

## 2022-01-18 NOTE — Assessment & Plan Note (Addendum)
Right upper lobe adenocarcinoma that was wedge resected.  No completion segmentectomy or lobectomy has been done.  The margins were negative.  She had some mild lymphadenopathy that now appears to be resolved and was likely not clinically significant.  CT scan of the chest reviewed.  She has no pathologic lymphadenopathy, no indication to pursue endobronchial ultrasound and nodal biopsies at this time.  The surgical site looked good.  She has a 3 mm right lower lobe pulmonary nodule that will be followed.  Dr. Pamelia Hoit has arranged for a repeat CT in April which is the 66-month mark.  Subsequent image timing to be determined depending on results.  She can follow-up with me in February 2025 or sooner if there is a need to based on her imaging.

## 2022-01-26 ENCOUNTER — Ambulatory Visit (HOSPITAL_COMMUNITY)
Admission: RE | Admit: 2022-01-26 | Discharge: 2022-01-26 | Disposition: A | Discharge status: Home / Self Care | Payer: Medicare PPO | Source: Ambulatory Visit | Attending: Interventional Radiology | Admitting: Interventional Radiology

## 2022-01-26 DIAGNOSIS — I671 Cerebral aneurysm, nonruptured: Secondary | ICD-10-CM | POA: Insufficient documentation

## 2022-01-26 MED ORDER — GADOBUTROL 1 MMOL/ML IV SOLN
10.0000 mL | Freq: Once | INTRAVENOUS | Status: AC | PRN
  Administered 2022-01-26: 10 mL via INTRAVENOUS

## 2022-01-31 DIAGNOSIS — M1711 Unilateral primary osteoarthritis, right knee: Secondary | ICD-10-CM | POA: Diagnosis not present

## 2022-02-03 ENCOUNTER — Other Ambulatory Visit (HOSPITAL_COMMUNITY): Payer: Self-pay | Admitting: Interventional Radiology

## 2022-02-03 DIAGNOSIS — I671 Cerebral aneurysm, nonruptured: Secondary | ICD-10-CM

## 2022-02-04 ENCOUNTER — Ambulatory Visit (HOSPITAL_COMMUNITY)
Admission: RE | Admit: 2022-02-04 | Discharge: 2022-02-04 | Disposition: A | Discharge status: Home / Self Care | Payer: Medicare PPO | Source: Ambulatory Visit | Attending: Interventional Radiology | Admitting: Interventional Radiology

## 2022-02-04 DIAGNOSIS — I671 Cerebral aneurysm, nonruptured: Secondary | ICD-10-CM

## 2022-02-06 HISTORY — PX: IR RADIOLOGIST EVAL & MGMT: IMG5224

## 2022-02-09 ENCOUNTER — Other Ambulatory Visit (HOSPITAL_COMMUNITY): Payer: Self-pay | Admitting: Interventional Radiology

## 2022-02-09 DIAGNOSIS — I671 Cerebral aneurysm, nonruptured: Secondary | ICD-10-CM

## 2022-02-24 ENCOUNTER — Ambulatory Visit (HOSPITAL_COMMUNITY)
Admission: RE | Admit: 2022-02-24 | Discharge: 2022-02-24 | Disposition: A | Discharge status: Home / Self Care | Payer: Medicare PPO | Source: Ambulatory Visit | Attending: Interventional Radiology | Admitting: Interventional Radiology

## 2022-02-24 DIAGNOSIS — I671 Cerebral aneurysm, nonruptured: Secondary | ICD-10-CM | POA: Diagnosis not present

## 2022-02-24 DIAGNOSIS — J341 Cyst and mucocele of nose and nasal sinus: Secondary | ICD-10-CM | POA: Diagnosis not present

## 2022-02-24 DIAGNOSIS — J329 Chronic sinusitis, unspecified: Secondary | ICD-10-CM | POA: Diagnosis not present

## 2022-02-24 DIAGNOSIS — I672 Cerebral atherosclerosis: Secondary | ICD-10-CM | POA: Diagnosis not present

## 2022-02-24 DIAGNOSIS — M47812 Spondylosis without myelopathy or radiculopathy, cervical region: Secondary | ICD-10-CM | POA: Diagnosis not present

## 2022-02-24 MED ORDER — IOHEXOL 350 MG/ML SOLN
150.0000 mL | Freq: Once | INTRAVENOUS | Status: AC | PRN
  Administered 2022-02-24: 150 mL via INTRAVENOUS

## 2022-02-28 ENCOUNTER — Ambulatory Visit
Admission: RE | Admit: 2022-02-28 | Discharge: 2022-02-28 | Disposition: A | Discharge status: Home / Self Care | Payer: Medicare PPO | Source: Ambulatory Visit | Attending: Obstetrics and Gynecology | Admitting: Obstetrics and Gynecology

## 2022-02-28 DIAGNOSIS — R928 Other abnormal and inconclusive findings on diagnostic imaging of breast: Secondary | ICD-10-CM | POA: Diagnosis not present

## 2022-02-28 DIAGNOSIS — N641 Fat necrosis of breast: Secondary | ICD-10-CM

## 2022-02-28 DIAGNOSIS — N6489 Other specified disorders of breast: Secondary | ICD-10-CM | POA: Diagnosis not present

## 2022-02-28 DIAGNOSIS — Z853 Personal history of malignant neoplasm of breast: Secondary | ICD-10-CM | POA: Diagnosis not present

## 2022-03-04 DIAGNOSIS — M25561 Pain in right knee: Secondary | ICD-10-CM | POA: Diagnosis not present

## 2022-03-09 ENCOUNTER — Other Ambulatory Visit (HOSPITAL_COMMUNITY): Payer: Self-pay | Admitting: Interventional Radiology

## 2022-03-09 ENCOUNTER — Other Ambulatory Visit: Payer: Self-pay | Admitting: Physician Assistant

## 2022-03-09 DIAGNOSIS — I671 Cerebral aneurysm, nonruptured: Secondary | ICD-10-CM

## 2022-03-10 DIAGNOSIS — H25013 Cortical age-related cataract, bilateral: Secondary | ICD-10-CM | POA: Diagnosis not present

## 2022-03-10 DIAGNOSIS — H524 Presbyopia: Secondary | ICD-10-CM | POA: Diagnosis not present

## 2022-03-10 DIAGNOSIS — H5213 Myopia, bilateral: Secondary | ICD-10-CM | POA: Diagnosis not present

## 2022-03-10 DIAGNOSIS — H2513 Age-related nuclear cataract, bilateral: Secondary | ICD-10-CM | POA: Diagnosis not present

## 2022-03-15 ENCOUNTER — Other Ambulatory Visit: Payer: Self-pay | Admitting: Radiology

## 2022-03-15 ENCOUNTER — Other Ambulatory Visit: Payer: Self-pay

## 2022-03-15 ENCOUNTER — Encounter (HOSPITAL_COMMUNITY): Payer: Self-pay | Admitting: Interventional Radiology

## 2022-03-15 ENCOUNTER — Telehealth: Payer: Self-pay | Admitting: Radiology

## 2022-03-15 DIAGNOSIS — I671 Cerebral aneurysm, nonruptured: Secondary | ICD-10-CM

## 2022-03-15 NOTE — Telephone Encounter (Signed)
   Pt is scheduled for L MCA aneurysm embolization in NIR with Dr Estanislado Pandy 03/16/22  In talking with the [facility] Pre op RN this am--- she mentioned that she has a "sore tooth"  Wanted [place] to be aware; RN contacted me regarding same.  I called pt; she states she has had some discomfort in this tooth after filling came out 2 weeks ago. Pain is minimal; no redness; no pus noted; No fever. She has not sought dentist for this discomfort; takes no meds for pain.  I have discussed with Dr Estanislado Pandy this report of symptoms.  He is aware and will plan to proceed tomorrow unless symptoms were to change. Pt is aware if discomfort becomes pain; or development of inflammation or redness at tooth area; if has fever---- she should not come tomorrow and we would reschedule procedure. Recommend seeing dentist asap for these complaints  She has good understanding And is agreeable
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
## 2022-03-15 NOTE — Progress Notes (Addendum)
Mrs. Harshman denies chest pain or shortness of breath. Patient denies having any s/s of Covid in her household, also denies any known exposure to Covid. Mrs Zhao reports that she has a running nose, clear drainage, has had it for years.  Mrs. Santalucia denies s/s of upper or lower respiratory infections in the past 2 months.  Mrs. Callaway's PCP is Dr. Antony Contras.   Mrs. Modi states she has "a tooth issue, it is not  pain, it is tender." Patient has not seen anyone regarding the tooth.  I asked  anesthesia,  PA-C or NP to review chart. I spoke with Candie Mile, PA-C, to inform her of patient tender tooth.  Pam said she would forward information to [place], who is working with Dr Estanislado Pandy today.

## 2022-03-15 NOTE — H&P (Signed)
Chief Complaint: Patient was seen in consultation today for left MCA aneurysm  Supervising Physician: Luanne Bras  Patient Status: [facility] - Out-pt  History of Present Illness: Virginia Lindsey is a 72 y.o. female with a medical history significant for breast cancer, renal cancer, lung cancer, seizures and a left MCA aneurysm. She was referred to Neuro Interventional Radiology in early 2023 for treatment/management and she underwent endovascular treatment with a pipeline shield flow diverter 04/05/21. She has undergone surveillance imaging since then and her most recent imaging 01/26/22 revealed a possible slight increase in the size of the flow signal within the aneurysm. There is concern for an increase in size of the aneurysm.  The patient met with Dr. Estanislado Pandy 02/04/22 to discuss these findings and further treatment/management options. Additional imaging was ordered.  CTA head/neck 02/24/22 IMPRESSION: CT head: 1.  No evidence of an acute intracranial abnormality. 2. Chronic small vessel ischemic disease with chronic lacunar infarcts, as described and stable from the prior brain MRI of 01/26/2022. 3. Mild paranasal sinus disease. CTA neck: 1. The common carotid, internal carotid and vertebral arteries are patent within the neck without stenosis or significant atherosclerotic disease. 2.  Aortic Atherosclerosis (ICD10-I70.0). 3. Multiple small centrilobular and ground-glass pulmonary nodules within the imaged upper lobes, similar to the prior chest CT of 01/10/2022, and compatible with nonspecific bronchiolitis (such as due to smoking or hypersensitivity pneumonitis). 4. Sequelae of prior right upper lobe wedge resection, incompletely imaged and incompletely assessed. CTA head: 1. Redemonstrated flow divider stent within the distal M1/proximal M2 left middle cerebral artery. 8.0 x 5.5 mm residual aneurysm along the course of the stent, unchanged in size from the prior MRA  head of 01/26/2022. 2. 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  Dr. Estanislado Pandy recommended re-treating the aneurysm and the patient was in agreement to proceed. She was made aware that the procedure would be done under general anesthesia with planned overnight observation in the Neuro ICU.   Past Medical History:  Diagnosis Date   Arthritis    Back pain    Breast cancer ([place]) 2018   left   COVID 2020   mild   Dyspnea    with walking a distance   Family history of breast cancer    Family history of melanoma    Family history of renal cancer    Palpitations    able to get them under control by deep breathing   Personal history of radiation therapy    PONV (postoperative nausea and vomiting)    Racing heart beat    per pt=going on for years, PCP aware per pt.   Seizures ([place])    last seizure 2004  3 total 10 yrs apart   Sleep apnea    Dx 20-30 years ago, does not use CPAP-sleeps with head elevated.   Umbilical hernia    Wears glasses     Past Surgical History:  Procedure Laterality Date   ABDOMINAL HYSTERECTOMY  1991   partial hysterectomy   BRAIN SURGERY     BREAST BIOPSY Left 10/29/2012   Procedure:  LEFT BREAST WIRE GUIDED EXCISION;  Surgeon: Rolm Bookbinder, MD;  Location: [place];  Service: General;  Laterality: Left;   BREAST EXCISIONAL BIOPSY     BREAST LUMPECTOMY Left 12/19/2016   BREAST LUMPECTOMY WITH RADIOACTIVE SEED AND SENTINEL LYMPH NODE BIOPSY Left 12/19/2016   Procedure: LEFT BREAST LUMPECTOMY WITH RADIOACTIVE SEED AND LEFT AXILLARY  SENTINEL LYMPH NODE BIOPSY;  Surgeon: Rolm Bookbinder, MD;  Location: [place];  Service: General;  Laterality: Left;   BREAST SURGERY  2013   biopsy-lt   BRONCHIAL BRUSHINGS  10/18/2021   Procedure: BRONCHIAL BRUSHINGS;  Surgeon: Collene Gobble, MD;  Location: [facility] ENDOSCOPY;  Service: Pulmonary;;   BRONCHIAL NEEDLE ASPIRATION BIOPSY  10/18/2021    Procedure: BRONCHIAL NEEDLE ASPIRATION BIOPSIES;  Surgeon: Collene Gobble, MD;  Location: [facility] ENDOSCOPY;  Service: Pulmonary;;   CHOLECYSTECTOMY     COLONOSCOPY     FIDUCIAL MARKER PLACEMENT  10/18/2021   Procedure: Vernon Prey Marking;  Surgeon: Collene Gobble, MD;  Location: [facility] ENDOSCOPY;  Service: Pulmonary;;   INTERCOSTAL NERVE BLOCK Right 10/18/2021   Procedure: INTERCOSTAL NERVE BLOCK;  Surgeon: Lajuana Matte, MD;  Location: [place];  Service: Thoracic;  Laterality: Right;   IR 3D INDEPENDENT WKST  03/18/2021   IR 3D INDEPENDENT WKST  04/05/2021   IR ANGIO INTRA EXTRACRAN SEL COM CAROTID INNOMINATE BILAT MOD SED  03/18/2021   IR ANGIO INTRA EXTRACRAN SEL INTERNAL CAROTID UNI L MOD SED  04/05/2021   IR ANGIO VERTEBRAL SEL VERTEBRAL BILAT MOD SED  03/18/2021   IR ANGIOGRAM FOLLOW UP STUDY  04/05/2021   IR CT HEAD LTD  04/05/2021   IR NEURO EACH ADD'L AFTER BASIC UNI LEFT (MS)  04/05/2021   IR RADIOLOGIST EVAL & MGMT  03/15/2021   IR RADIOLOGIST EVAL & MGMT  03/25/2021   IR RADIOLOGIST EVAL & MGMT  04/21/2021   IR RADIOLOGIST EVAL & MGMT  02/06/2022   IR TRANSCATH/EMBOLIZ  04/05/2021   LUMBAR LAMINECTOMY/DECOMPRESSION MICRODISCECTOMY Right 08/27/2012   Procedure: RIGHT  LUMBAR FOUR FIVE LAMINECTOMY/DECOMPRESSION MICRODISCECTOMY 1 LEVEL;  Surgeon: Elaina Hoops, MD;  Location: [facility] NEURO ORS;  Service: Neurosurgery;  Laterality: Right;   RADIOLOGY WITH ANESTHESIA N/A 04/05/2021   Procedure: EMBOLIZATION;  Surgeon: Luanne Bras, MD;  Location: [place];  Service: Radiology;  Laterality: N/A;   UPPER GI ENDOSCOPY     multiple endoscopy's   VIDEO BRONCHOSCOPY WITH RADIAL ENDOBRONCHIAL ULTRASOUND  10/18/2021   Procedure: VIDEO BRONCHOSCOPY WITH RADIAL ENDOBRONCHIAL ULTRASOUND;  Surgeon: Collene Gobble, MD;  Location: [facility] ENDOSCOPY;  Service: Pulmonary;;    Allergies: Codeine, Hydrocodone, and Latex  Medications: Prior to Admission medications   Medication Sig Start Date End Date  Taking? Authorizing Provider  aspirin EC 81 MG tablet Take 81 mg by mouth in the morning. Swallow whole.   Yes [provider]  cholecalciferol (VITAMIN D3) 25 MCG (1000 UNIT) tablet Take 2 tablets (2,000 Units total) by mouth daily. 10/19/21  Yes Gold, Wayne E, PA-C  Cyanocobalamin (B-12) 1000 MCG TABS Take 1,000 mcg by mouth daily. 08/01/21  Yes [provider]  letrozole ([place]) 2.5 MG tablet TAKE 1 TABLET BY MOUTH EVERY DAY 07/02/21  Yes Nicholas Lose, MD  ticagrelor (BRILINTA) 90 MG TABS tablet Take 0.5 tablets (45 mg total) by mouth 2 (two) times daily. 10/19/21  Yes Gold, Wilder Glade, PA-C  Turmeric (QC TUMERIC COMPLEX) 500 MG CAPS Take 500 mg by mouth daily. Patient not taking: Reported on 03/11/2022 01/13/22   Nicholas Lose, MD     Family History  Problem Relation Age of Onset   Asthma Mother    Cancer Mother        renal cell carcinoma, dx in her 8s, d. 80   Cancer Father 32       melanoma on face; d. 43   Diabetes  Maternal Grandmother    Breast cancer Sister 19       d. 75   Breast cancer Paternal Aunt 18       dbl mastectomy   Cancer Paternal Uncle        NOS   Melanoma Paternal Aunt    Breast cancer Paternal Aunt    Melanoma Cousin        pat first cousin; daughter of aunt with melanoma   Breast cancer Cousin    Lymphoma Son 4       [place] Lymphoma    Social History   Socioeconomic History   Marital status: Married    Spouse name: Not on file   Number of children: 4   Years of education: Not on file   Highest education level: Not on file  Occupational History   Not on file  Tobacco Use   Smoking status: Never   Smokeless tobacco: Never  Vaping Use   Vaping Use: Never used  Substance and Sexual Activity   Alcohol use: Never   Drug use: Never   Sexual activity: Not on file  Other Topics Concern   Not on file  Social History Narrative   Not on file   Social Determinants of Health   Financial Resource Strain: Not on file  Food  Insecurity: Not on file  Transportation Needs: Not on file  Physical Activity: Not on file  Stress: Not on file  Social Connections: Not on file    Review of Systems: A 12 point ROS discussed and pertinent positives are indicated in the HPI above.  All other systems are negative.  Review of Systems  Constitutional:  Negative for appetite change and fatigue.  Respiratory:  Negative for cough and shortness of breath.   Cardiovascular:  Negative for chest pain and leg swelling.  Gastrointestinal:  Negative for abdominal pain, diarrhea, nausea and vomiting.  Musculoskeletal:  Negative for back pain.  Neurological:  Negative for dizziness and headaches.    Vital Signs: BP (!) 150/95   Pulse 79   Temp 98.7 F (37.1 C) (Oral)   Resp 18   Ht '5\' 3"'$  (1.6 m)   Wt 212 lb (96.2 kg)   SpO2 96%   BMI 37.55 kg/m   Physical Exam Constitutional:      General: She is not in acute distress.    Appearance: She is not ill-appearing.  HENT:     Mouth/Throat:     Mouth: Mucous membranes are moist.     Pharynx: Oropharynx is clear.  Cardiovascular:     Rate and Rhythm: Normal rate and regular rhythm.     Pulses: Normal pulses.     Heart sounds: Normal heart sounds.  Pulmonary:     Effort: Pulmonary effort is normal.     Breath sounds: Normal breath sounds.  Abdominal:     General: Bowel sounds are normal.     Palpations: Abdomen is soft.     Tenderness: There is no abdominal tenderness.  Musculoskeletal:     Right lower leg: No edema.     Left lower leg: No edema.  Skin:    General: Skin is warm and dry.  Neurological:     Mental Status: She is alert and oriented to person, place, and time.  Psychiatric:        Mood and Affect: Mood normal.        Behavior: Behavior normal.        Thought Content: Thought content normal.  Judgment: Judgment normal.     Imaging: MM DIAG BREAST TOMO BILATERAL  Result Date: 02/28/2022 CLINICAL DATA:  72 year old female with history of  left breast lumpectomy in 2018 presenting for follow-up of a likely benign palpable area in the left breast, favored to represent fat necrosis. EXAM: DIGITAL DIAGNOSTIC BILATERAL MAMMOGRAM WITH TOMOSYNTHESIS; ULTRASOUND LEFT BREAST LIMITED TECHNIQUE: Bilateral digital diagnostic mammography and breast tomosynthesis was performed.; Targeted ultrasound examination of the left breast was performed. COMPARISON:  Previous exam(s). ACR Breast Density Category b: There are scattered areas of fibroglandular density. FINDINGS: Stable left breast lumpectomy site in the upper-outer quadrant. No suspicious calcifications, masses or areas of distortion are seen in the bilateral breasts. The oil cysts previously seen in the upper inner left breast have decreased in size from prior exam. No suspicious findings are identified in this location. Ultrasound targeted to the left breast at 10 o'clock, 5 cm from the nipple demonstrates a few persistent residual oil cysts that appear smaller/more collapse than on the prior exam. No suspicious findings are identified. IMPRESSION: 1.  Healing benign fat necrosis in the upper inner left breast. 2. Stable left breast lumpectomy site. No mammographic evidence of malignancy in the bilateral breasts. RECOMMENDATION: Screening mammogram in one year.(Code:SM-B-01Y) I have discussed the findings and recommendations with the patient. If applicable, a reminder letter will be sent to the patient regarding the next appointment. BI-RADS CATEGORY  2: Benign. Electronically Signed   By: Ammie Ferrier M.D.   On: 02/28/2022 08:21  US BREAST LTD UNI LEFT INC AXILLA  Result Date: 02/28/2022 CLINICAL DATA:  72 year old female with history of left breast lumpectomy in 2018 presenting for follow-up of a likely benign palpable area in the left breast, favored to represent fat necrosis. EXAM: DIGITAL DIAGNOSTIC BILATERAL MAMMOGRAM WITH TOMOSYNTHESIS; ULTRASOUND LEFT BREAST LIMITED TECHNIQUE: Bilateral  digital diagnostic mammography and breast tomosynthesis was performed.; Targeted ultrasound examination of the left breast was performed. COMPARISON:  Previous exam(s). ACR Breast Density Category b: There are scattered areas of fibroglandular density. FINDINGS: Stable left breast lumpectomy site in the upper-outer quadrant. No suspicious calcifications, masses or areas of distortion are seen in the bilateral breasts. The oil cysts previously seen in the upper inner left breast have decreased in size from prior exam. No suspicious findings are identified in this location. Ultrasound targeted to the left breast at 10 o'clock, 5 cm from the nipple demonstrates a few persistent residual oil cysts that appear smaller/more collapse than on the prior exam. No suspicious findings are identified. IMPRESSION: 1.  Healing benign fat necrosis in the upper inner left breast. 2. Stable left breast lumpectomy site. No mammographic evidence of malignancy in the bilateral breasts. RECOMMENDATION: Screening mammogram in one year.(Code:[id number]) I have discussed the findings and recommendations with the patient. If applicable, a reminder letter will be sent to the patient regarding the next appointment. BI-RADS CATEGORY  2: Benign. Electronically Signed   By: Ammie Ferrier M.D.   On: 02/28/2022 08:21  CT ANGIO HEAD NECK W WO CM  Result Date: 02/27/2022 CLINICAL DATA:  Provided history: Brain aneurysm. EXAM: CT ANGIOGRAPHY HEAD AND NECK TECHNIQUE: Multidetector CT imaging of the head and neck was performed using the standard protocol during bolus administration of intravenous contrast. Multiplanar CT image reconstructions and MIPs were obtained to evaluate the vascular anatomy. Carotid stenosis measurements (when applicable) are obtained utilizing NASCET criteria, using the distal internal carotid diameter as the denominator. RADIATION DOSE REDUCTION: This exam was performed according to the departmental  dose-optimization  program which includes automated exposure control, adjustment of the mA and/or kV according to patient size and/or use of iterative reconstruction technique. CONTRAST:  188m OMNIPAQUE IOHEXOL 350 MG/ML SOLN COMPARISON:  MRI brain and MRA head 01/26/2022. Chest CT 01/10/2022. FINDINGS: CT HEAD FINDINGS Brain: No age advanced or lobar predominant parenchymal atrophy. Redemonstrated chronic lacunar infarcts within the left parietal lobe white matter and left basal ganglia/internal capsule. Background mild patchy and ill-defined hypoattenuation within the cerebral white matter, nonspecific but compatible chronic small vessel ischemic disease. There is no acute intracranial hemorrhage. No demarcated cortical infarct. No extra-axial fluid collection. No evidence of an intracranial mass. No midline shift. Vascular: Redemonstrated flow diverter stent within the M1/proximal M2 left middle cerebral artery. No hyperdense vessel. Atherosclerotic calcifications. Skull: No fracture or aggressive osseous lesion. Sinuses/Orbits: No mass or acute finding within the imaged orbits. 9 mm mucous retention cyst within the right maxillary sinus. Mild mucosal thickening within the left maxillary sinus. Review of the MIP images confirms the above findings CTA NECK FINDINGS Aortic arch: Standard aortic branching. Atherosclerotic plaque within the visualized aortic arch and proximal major branch vessels of the neck. Streak and beam hardening artifact arising from a dense right-sided contrast bolus partially obscures the right subclavian artery. Within this limitation, there is no appreciable hemodynamically significant innominate or proximal subclavian artery stenosis. Right carotid system: CCA and ICA patent within the neck without stenosis or significant atherosclerotic disease. Left carotid system: CCA and ICA patent within the neck without stenosis or significant atherosclerotic disease. Vertebral arteries: Vertebral arteries codominant  and patent within the neck without stenosis or significant atherosclerotic disease. Skeleton: Cervical spondylosis. No acute fracture or aggressive osseous lesion. Other neck: Subcentimeter thyroid nodules, not meeting consensus criteria for ultrasound follow-up based on size. No follow-up imaging is recommended. Reference: J Am Coll Radiol. 2015 Feb;12(2): 143-50. Upper chest: Sequelae of prior right upper lobe wedge resection, incompletely imaged and incompletely assessed. Multiple small centrilobular and ground-glass pulmonary nodules within the imaged upper lobes, similar to the prior chest CT of 01/10/2022, and compatible with nonspecific bronchiolitis (such as due to smoking or hypersensitivity pneumonitis). Review of the MIP images confirms the above findings CTA HEAD FINDINGS Anterior circulation: The intracranial internal carotid arteries are patent. The M1 middle cerebral arteries are patent. Redemonstrated flow diverter stent within the distal M1/proximal M2 left middle cerebral artery. Unchanged size of a residual aneurysm along the course of the stent, again measuring 8.0 x 5.5 mm (for instance as seen on series 17, image 178) (series 15, image 114). 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 No M2 proximal branch occlusion or high-grade proximal stenosis. The anterior cerebral arteries are patent. Posterior circulation: The intracranial vertebral arteries are patent. The basilar artery is patent. The posterior cerebral arteries are patent. Posterior communicating arteries are present bilaterally. Venous sinuses: Within the limitations of contrast timing, no convincing thrombus. Anatomic variants: None significant. Review of the MIP images confirms the above findings IMPRESSION: CT head: 1.  No evidence of an acute intracranial abnormality. 2. Chronic small vessel ischemic disease with chronic lacunar infarcts, as described and  stable from the prior brain MRI of 01/26/2022. 3. Mild paranasal sinus disease. CTA neck: 1. The common carotid, internal carotid and vertebral arteries are patent within the neck without stenosis or significant atherosclerotic disease. 2.  Aortic Atherosclerosis (ICD10-I70.0). 3. Multiple small centrilobular and ground-glass pulmonary nodules within the imaged  upper lobes, similar to the prior chest CT of 01/10/2022, and compatible with nonspecific bronchiolitis (such as due to smoking or hypersensitivity pneumonitis). 4. Sequelae of prior right upper lobe wedge resection, incompletely imaged and incompletely assessed. CTA head: 1. Redemonstrated flow divider stent within the distal M1/proximal M2 left middle cerebral artery. 8.0 x 5.5 mm residual aneurysm along the course of the stent, unchanged in size from the prior MRA head of 01/26/2022. 2. 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 Electronically Signed   By: Kellie Simmering D.O.   On: 02/27/2022 12:25    Labs:  CBC: Recent Labs    10/14/21 0900 10/19/21 0014 01/07/22 1026 03/16/22 0715  WBC 8.9 11.5* 7.0 6.6  HGB 14.1 12.6 14.6 13.3  HCT 41.8 36.6 42.7 39.1  PLT 301 287 311 269    COAGS: Recent Labs    03/18/21 0748 04/05/21 0717 10/14/21 0900 03/16/22 0715  INR 0.9 1.0 1.0 1.0  APTT  --   --  28  --     BMP: Recent Labs    04/06/21 0335 10/14/21 0900 10/19/21 0014 01/07/22 1026  NA 143 139 133* 142  K 3.5 3.3* 3.9 3.8  CL 115* 107 102 107  CO2 21* '25 24 29  '$ GLUCOSE 116* 96 145* 83  BUN '8 15 13 16  '$ CALCIUM 8.4* 9.2 8.9 9.4  CREATININE 0.77 0.96 1.00 0.84  GFRNONAA >60 >60 59* >60    LIVER FUNCTION TESTS: Recent Labs    10/14/21 0900 01/07/22 1026  BILITOT 0.8 0.5  AST 17 11*  ALT 18 10  ALKPHOS 97 98  PROT 7.1 7.0  ALBUMIN 3.8 4.2    TUMOR MARKERS: No results for input(s): "AFPTM", "CEA", "CA199", "CHROMGRNA" in the last 8760 hours.  Assessment and Plan:  Left  MCA aneurysm: Virginia Lindsey, 72 year old female, presents today to the [facility] Neuro Interventional Radiology department for an image-guided diagnostic cerebral angiogram with possible intervention, possible treatment of a left MCA aneurysm.  Risks and benefits of this procedure were discussed with the patient including, but not limited to bleeding, infection, vascular injury or contrast induced renal failure. The risk of stroke was discussed.   This interventional procedure involves the use of X-rays and because of the nature of the planned procedure, it is possible that we will have prolonged use of X-ray fluoroscopy.  Potential radiation risks to you include (but are not limited to) the following: - A slightly elevated risk for cancer  several years later in life. This risk is typically less than 0.5% percent. This risk is low in comparison to the normal incidence of human cancer, which is 33% for women and 50% for men according to the [place]. - Radiation induced injury can include skin redness, resembling a rash, tissue breakdown / ulcers and hair loss (which can be temporary or permanent).   The likelihood of either of these occurring depends on the difficulty of the procedure and whether you are sensitive to radiation due to previous procedures, disease, or genetic conditions.   IF your procedure requires a prolonged use of radiation, you will be notified and given written instructions for further action.  It is your responsibility to monitor the irradiated area for the 2 weeks following the procedure and to notify your physician if you are concerned that you have suffered a radiation induced injury.    All of the patient's questions were answered, patient is agreeable  to proceed. She has been NPO. She is a full code. Her last dose of Brilinta and Aspirin was this morning.   Consent signed and in chart.   Thank you for this interesting consult.  I greatly enjoyed  meeting Virginia Lindsey and look forward to participating in their care.  A copy of this report was sent to the requesting provider on this date.  Electronically Signed: Soyla Dryer, AGACNP-BC [phone] 03/16/2022, 7:29 AM   I spent a total of  30 Minutes   in face to face in clinical consultation, greater than 50% of which was counseling/coordinating care for diagnostic cerebral angiogram with possible intervention, possible embolization of left MCA aneurysm.

## 2022-03-16 ENCOUNTER — Other Ambulatory Visit: Payer: Self-pay

## 2022-03-16 ENCOUNTER — Inpatient Hospital Stay (HOSPITAL_COMMUNITY)
Admission: RE | Admit: 2022-03-16 | Discharge: 2022-03-17 | DRG: 027 | Disposition: A | Discharge status: Home / Self Care | Payer: Medicare PPO | Attending: Interventional Radiology | Admitting: Interventional Radiology

## 2022-03-16 ENCOUNTER — Inpatient Hospital Stay (HOSPITAL_COMMUNITY): Payer: Medicare PPO | Admitting: Emergency Medicine

## 2022-03-16 ENCOUNTER — Inpatient Hospital Stay (HOSPITAL_COMMUNITY)
Admission: RE | Admit: 2022-03-16 | Discharge: 2022-03-16 | Disposition: A | Discharge status: Home / Self Care | Payer: Medicare PPO | Source: Ambulatory Visit | Attending: Interventional Radiology | Admitting: Interventional Radiology

## 2022-03-16 ENCOUNTER — Encounter (HOSPITAL_COMMUNITY)
Admission: RE | Disposition: A | Discharge status: Home / Self Care | Payer: Self-pay | Source: Home / Self Care | Attending: Interventional Radiology

## 2022-03-16 ENCOUNTER — Encounter (HOSPITAL_COMMUNITY): Payer: Self-pay | Admitting: Interventional Radiology

## 2022-03-16 DIAGNOSIS — I671 Cerebral aneurysm, nonruptured: Principal | ICD-10-CM | POA: Diagnosis present

## 2022-03-16 DIAGNOSIS — Z7901 Long term (current) use of anticoagulants: Secondary | ICD-10-CM | POA: Diagnosis not present

## 2022-03-16 DIAGNOSIS — Z973 Presence of spectacles and contact lenses: Secondary | ICD-10-CM

## 2022-03-16 DIAGNOSIS — Z853 Personal history of malignant neoplasm of breast: Secondary | ICD-10-CM

## 2022-03-16 DIAGNOSIS — Z833 Family history of diabetes mellitus: Secondary | ICD-10-CM

## 2022-03-16 DIAGNOSIS — Z923 Personal history of irradiation: Secondary | ICD-10-CM

## 2022-03-16 DIAGNOSIS — Z87891 Personal history of nicotine dependence: Secondary | ICD-10-CM

## 2022-03-16 DIAGNOSIS — G4730 Sleep apnea, unspecified: Secondary | ICD-10-CM | POA: Diagnosis present

## 2022-03-16 DIAGNOSIS — Z9049 Acquired absence of other specified parts of digestive tract: Secondary | ICD-10-CM

## 2022-03-16 DIAGNOSIS — Z85528 Personal history of other malignant neoplasm of kidney: Secondary | ICD-10-CM

## 2022-03-16 DIAGNOSIS — Z825 Family history of asthma and other chronic lower respiratory diseases: Secondary | ICD-10-CM

## 2022-03-16 DIAGNOSIS — Z807 Family history of other malignant neoplasms of lymphoid, hematopoietic and related tissues: Secondary | ICD-10-CM | POA: Diagnosis not present

## 2022-03-16 DIAGNOSIS — Z8051 Family history of malignant neoplasm of kidney: Secondary | ICD-10-CM

## 2022-03-16 DIAGNOSIS — Z90711 Acquired absence of uterus with remaining cervical stump: Secondary | ICD-10-CM | POA: Diagnosis not present

## 2022-03-16 DIAGNOSIS — I700 Atherosclerosis of aorta: Secondary | ICD-10-CM | POA: Diagnosis not present

## 2022-03-16 DIAGNOSIS — Z7982 Long term (current) use of aspirin: Secondary | ICD-10-CM

## 2022-03-16 DIAGNOSIS — Z808 Family history of malignant neoplasm of other organs or systems: Secondary | ICD-10-CM | POA: Diagnosis not present

## 2022-03-16 DIAGNOSIS — Z91040 Latex allergy status: Secondary | ICD-10-CM

## 2022-03-16 DIAGNOSIS — Z85118 Personal history of other malignant neoplasm of bronchus and lung: Secondary | ICD-10-CM | POA: Diagnosis not present

## 2022-03-16 DIAGNOSIS — M1990 Unspecified osteoarthritis, unspecified site: Secondary | ICD-10-CM

## 2022-03-16 DIAGNOSIS — Z885 Allergy status to narcotic agent status: Secondary | ICD-10-CM | POA: Diagnosis not present

## 2022-03-16 DIAGNOSIS — Z79811 Long term (current) use of aromatase inhibitors: Secondary | ICD-10-CM

## 2022-03-16 DIAGNOSIS — Z8616 Personal history of COVID-19: Secondary | ICD-10-CM

## 2022-03-16 DIAGNOSIS — Z79899 Other long term (current) drug therapy: Secondary | ICD-10-CM | POA: Diagnosis not present

## 2022-03-16 DIAGNOSIS — Z803 Family history of malignant neoplasm of breast: Secondary | ICD-10-CM

## 2022-03-16 HISTORY — PX: IR ANGIO INTRA EXTRACRAN SEL INTERNAL CAROTID UNI L MOD SED: IMG5361

## 2022-03-16 HISTORY — PX: IR CT HEAD LTD: IMG2386

## 2022-03-16 HISTORY — PX: RADIOLOGY WITH ANESTHESIA: SHX6223

## 2022-03-16 HISTORY — PX: IR TRANSCATH/EMBOLIZ: IMG695

## 2022-03-16 HISTORY — PX: IR ANGIOGRAM FOLLOW UP STUDY: IMG697

## 2022-03-16 LAB — BASIC METABOLIC PANEL
Anion gap: 15 (ref 5–15)
BUN: 13 mg/dL (ref 8–23)
CO2: 21 mmol/L — ABNORMAL LOW (ref 22–32)
Calcium: 9 mg/dL (ref 8.9–10.3)
Chloride: 105 mmol/L (ref 98–111)
Creatinine, Ser: 0.93 mg/dL (ref 0.44–1.00)
GFR, Estimated: >60 mL/min (ref >60)
Glucose, Bld: 97 mg/dL (ref 70–99)
Potassium: 3.3 mmol/L — ABNORMAL LOW (ref 3.5–5.1)
Sodium: 141 mmol/L (ref 135–145)

## 2022-03-16 LAB — CBC WITH DIFFERENTIAL/PLATELET
Abs Immature Granulocytes: 0.03 10*3/uL (ref 0.00–0.07)
Basophils Absolute: 0 10*3/uL (ref 0.0–0.1)
Basophils Relative: 1 %
Eosinophils Absolute: 0.1 10*3/uL (ref 0.0–0.5)
Eosinophils Relative: 1 %
HCT: 39.1 % (ref 36.0–46.0)
Hemoglobin: 13.3 g/dL (ref 12.0–15.0)
Immature Granulocytes: 1 %
Lymphocytes Relative: 24 %
Lymphs Abs: 1.5 10*3/uL (ref 0.7–4.0)
MCH: 32.1 pg (ref 26.0–34.0)
MCHC: 34 g/dL (ref 30.0–36.0)
MCV: 94.4 fL (ref 80.0–100.0)
Monocytes Absolute: 0.6 10*3/uL (ref 0.1–1.0)
Monocytes Relative: 9 %
Neutro Abs: 4.3 10*3/uL (ref 1.7–7.7)
Neutrophils Relative %: 64 %
Platelets: 269 10*3/uL (ref 150–400)
RBC: 4.14 MIL/uL (ref 3.87–5.11)
RDW: 12.8 % (ref 11.5–15.5)
WBC: 6.6 10*3/uL (ref 4.0–10.5)
nRBC: 0 % (ref 0.0–0.2)

## 2022-03-16 LAB — PROTIME-INR
INR: 1 (ref 0.8–1.2)
Prothrombin Time: 13 seconds (ref 11.4–15.2)

## 2022-03-16 LAB — POCT ACTIVATED CLOTTING TIME
Activated Clotting Time: 185 seconds
Activated Clotting Time: 201 seconds

## 2022-03-16 LAB — APTT: aPTT: 37 seconds — ABNORMAL HIGH (ref 24–36)

## 2022-03-16 SURGERY — IR WITH ANESTHESIA
Anesthesia: General

## 2022-03-16 MED ORDER — DEXAMETHASONE SODIUM PHOSPHATE 10 MG/ML IJ SOLN
INTRAMUSCULAR | Status: DC | PRN
  Administered 2022-03-16: 5 mg via INTRAVENOUS

## 2022-03-16 MED ORDER — TICAGRELOR 90 MG PO TABS: 90.0000 mg | ORAL_TABLET | ORAL | Status: DC

## 2022-03-16 MED ORDER — HEPARIN (PORCINE) 25000 UT/250ML-% IV SOLN: 600.0000 [IU]/h | INTRAVENOUS | Status: DC

## 2022-03-16 MED ORDER — LACTATED RINGERS IV SOLN: INTRAVENOUS | Status: DC

## 2022-03-16 MED ORDER — HEPARIN SODIUM (PORCINE) 1000 UNIT/ML IJ SOLN
INTRAMUSCULAR | Status: DC | PRN
  Administered 2022-03-16: 1000 [IU] via INTRAVENOUS
  Administered 2022-03-16: 3000 [IU] via INTRAVENOUS

## 2022-03-16 MED ORDER — ACETAMINOPHEN 650 MG RE SUPP: 650.0000 mg | RECTAL | Status: DC | PRN

## 2022-03-16 MED ORDER — OXYCODONE HCL 5 MG PO TABS: 5.0000 mg | ORAL_TABLET | Freq: Once | ORAL | Status: DC | PRN

## 2022-03-16 MED ORDER — CHLORHEXIDINE GLUCONATE CLOTH 2 % EX PADS: 6.0000 | MEDICATED_PAD | Freq: Every day | CUTANEOUS | Status: DC

## 2022-03-16 MED ORDER — TICAGRELOR 90 MG PO TABS
90.0000 mg | ORAL_TABLET | Freq: Two times a day (BID) | ORAL | Status: DC
  Administered 2022-03-16 – 2022-03-17 (×2): 90 mg via ORAL
  Filled 2022-03-16 (×2): qty 1

## 2022-03-16 MED ORDER — SUGAMMADEX SODIUM 200 MG/2ML IV SOLN
INTRAVENOUS | Status: DC | PRN
  Administered 2022-03-16: 200 mg via INTRAVENOUS

## 2022-03-16 MED ORDER — LIDOCAINE HCL 1 % IJ SOLN
INTRAMUSCULAR | Status: AC
  Administered 2022-03-16: 8 mL
  Filled 2022-03-16: qty 20

## 2022-03-16 MED ORDER — CLEVIDIPINE BUTYRATE 0.5 MG/ML IV EMUL
0.0000 mg/h | INTRAVENOUS | Status: AC
  Administered 2022-03-16: 4 mg/h via INTRAVENOUS
  Administered 2022-03-16: 2 mg/h via INTRAVENOUS
  Administered 2022-03-16 (×2): 18 mg/h via INTRAVENOUS
  Administered 2022-03-17: 2 mg/h via INTRAVENOUS
  Administered 2022-03-17: 10 mg/h via INTRAVENOUS
  Filled 2022-03-16 (×5): qty 50

## 2022-03-16 MED ORDER — CEFAZOLIN SODIUM-DEXTROSE 2-4 GM/100ML-% IV SOLN
2.0000 g | INTRAVENOUS | Status: AC
  Administered 2022-03-16: 2 g via INTRAVENOUS
  Filled 2022-03-16 (×2): qty 100

## 2022-03-16 MED ORDER — CHLORHEXIDINE GLUCONATE 0.12 % MT SOLN: 15.0000 mL | Freq: Once | OROMUCOSAL | Status: AC

## 2022-03-16 MED ORDER — LIDOCAINE 2% (20 MG/ML) 5 ML SYRINGE
INTRAMUSCULAR | Status: DC | PRN
  Administered 2022-03-16: 100 mg via INTRAVENOUS

## 2022-03-16 MED ORDER — FENTANYL CITRATE (PF) 100 MCG/2ML IJ SOLN: 25.0000 ug | INTRAMUSCULAR | Status: DC | PRN

## 2022-03-16 MED ORDER — SODIUM CHLORIDE 0.9 % IV SOLN: INTRAVENOUS | Status: DC

## 2022-03-16 MED ORDER — SODIUM CHLORIDE 0.9 % IV SOLN: Freq: Once | INTRAVENOUS | Status: DC

## 2022-03-16 MED ORDER — OXYCODONE HCL 5 MG/5ML PO SOLN: 5.0000 mg | Freq: Once | ORAL | Status: DC | PRN

## 2022-03-16 MED ORDER — IOHEXOL 300 MG/ML  SOLN
150.0000 mL | Freq: Once | INTRAMUSCULAR | Status: AC | PRN
  Administered 2022-03-16: 100 mL via INTRA_ARTERIAL

## 2022-03-16 MED ORDER — EPTIFIBATIDE 20 MG/10ML IV SOLN
INTRAVENOUS | Status: AC | PRN
  Administered 2022-03-16 (×2): 1.5 mg via INTRAVENOUS

## 2022-03-16 MED ORDER — ASPIRIN 81 MG PO CHEW
81.0000 mg | CHEWABLE_TABLET | Freq: Once | ORAL | Status: DC
  Filled 2022-03-16: qty 1

## 2022-03-16 MED ORDER — CHLORHEXIDINE GLUCONATE 0.12 % MT SOLN
OROMUCOSAL | Status: AC
  Administered 2022-03-16: 15 mL
  Filled 2022-03-16: qty 15

## 2022-03-16 MED ORDER — HEPARIN (PORCINE) 25000 UT/250ML-% IV SOLN
500.0000 [IU]/h | INTRAVENOUS | Status: DC
  Administered 2022-03-16: 500 [IU]/h via INTRAVENOUS

## 2022-03-16 MED ORDER — ROCURONIUM BROMIDE 10 MG/ML (PF) SYRINGE
PREFILLED_SYRINGE | INTRAVENOUS | Status: DC | PRN
  Administered 2022-03-16: 50 mg via INTRAVENOUS
  Administered 2022-03-16: 10 mg via INTRAVENOUS

## 2022-03-16 MED ORDER — ACETAMINOPHEN 325 MG PO TABS: 650.0000 mg | ORAL_TABLET | ORAL | Status: DC | PRN

## 2022-03-16 MED ORDER — NITROGLYCERIN 1 MG/10 ML FOR IR/CATH LAB
INTRA_ARTERIAL | Status: AC
  Filled 2022-03-16: qty 10

## 2022-03-16 MED ORDER — TICAGRELOR 90 MG PO TABS: 90.0000 mg | ORAL_TABLET | Freq: Two times a day (BID) | ORAL | Status: DC

## 2022-03-16 MED ORDER — PROPOFOL 10 MG/ML IV BOLUS
INTRAVENOUS | Status: DC | PRN
  Administered 2022-03-16: 150 mg via INTRAVENOUS

## 2022-03-16 MED ORDER — PHENYLEPHRINE 80 MCG/ML (10ML) SYRINGE FOR IV PUSH (FOR BLOOD PRESSURE SUPPORT)
PREFILLED_SYRINGE | INTRAVENOUS | Status: DC | PRN
  Administered 2022-03-16: 80 ug via INTRAVENOUS

## 2022-03-16 MED ORDER — EPTIFIBATIDE 20 MG/10ML IV SOLN
INTRAVENOUS | Status: AC
  Filled 2022-03-16: qty 10

## 2022-03-16 MED ORDER — ACETAMINOPHEN 500 MG PO TABS
1000.0000 mg | ORAL_TABLET | Freq: Once | ORAL | Status: AC
  Administered 2022-03-16: 1000 mg via ORAL
  Filled 2022-03-16: qty 2

## 2022-03-16 MED ORDER — NIMODIPINE 30 MG PO CAPS: 0.0000 mg | ORAL_CAPSULE | ORAL | Status: DC

## 2022-03-16 MED ORDER — FENTANYL CITRATE (PF) 250 MCG/5ML IJ SOLN
INTRAMUSCULAR | Status: DC | PRN
  Administered 2022-03-16: 50 ug via INTRAVENOUS

## 2022-03-16 MED ORDER — AMISULPRIDE (ANTIEMETIC) 5 MG/2ML IV SOLN: 10.0000 mg | Freq: Once | INTRAVENOUS | Status: DC | PRN

## 2022-03-16 MED ORDER — HEPARIN (PORCINE) 25000 UT/250ML-% IV SOLN
INTRAVENOUS | Status: AC
  Filled 2022-03-16: qty 250

## 2022-03-16 MED ORDER — ASPIRIN 81 MG PO CHEW
81.0000 mg | CHEWABLE_TABLET | Freq: Every day | ORAL | Status: DC
  Administered 2022-03-17: 81 mg via ORAL
  Filled 2022-03-16: qty 1

## 2022-03-16 MED ORDER — ACETAMINOPHEN 160 MG/5ML PO SOLN: 650.0000 mg | ORAL | Status: DC | PRN

## 2022-03-16 MED ORDER — ASPIRIN 81 MG PO CHEW: 81.0000 mg | CHEWABLE_TABLET | Freq: Every day | ORAL | Status: DC

## 2022-03-16 MED ORDER — CLEVIDIPINE BUTYRATE 0.5 MG/ML IV EMUL
INTRAVENOUS | Status: AC
  Filled 2022-03-16: qty 50

## 2022-03-16 MED ORDER — ORAL CARE MOUTH RINSE: 15.0000 mL | Freq: Once | OROMUCOSAL | Status: AC

## 2022-03-16 MED ORDER — ONDANSETRON HCL 4 MG/2ML IJ SOLN
INTRAMUSCULAR | Status: DC | PRN
  Administered 2022-03-16: 4 mg via INTRAVENOUS

## 2022-03-16 MED ORDER — PHENYLEPHRINE HCL-NACL 20-0.9 MG/250ML-% IV SOLN
INTRAVENOUS | Status: DC | PRN
  Administered 2022-03-16: 40 ug/min via INTRAVENOUS

## 2022-03-16 NOTE — Procedures (Signed)
INR.  Status post left common carotid arteriogram.  Right CFA approach.  Findings.  Approximately 7.5 mm x 6 mm left middle cerebral artery trifurcation region aneurysm.  Status post embolization with a 3.25 x 14 mm pipeline flex vantage flow diverter with stasis.  Post CT brain no evidence of hemorrhage.  8 French Angio-Seal closure device deployed for hemostasis at the right groin puncture site.  Distal pulses all palpable unchanged.    Patient extubated.  Denies any headaches nausea vomiting.  Follows simple commands appropriately.  Pupils 3 mm equal bilaterally and reactive.  No facial asymmetry.  Tongue midline.  Moves all fours equally.  Arlean Hopping MD.

## 2022-03-16 NOTE — Progress Notes (Signed)
[place] for heparin Indication:  post neuro IR  Allergies  Allergen Reactions   Codeine Nausea And Vomiting   Hydrocodone Nausea And Vomiting    Also lightheaded and disoriented   Latex Rash    Patient Measurements: Height: '5\' 3"'$  (160 cm) Weight: 96.2 kg (212 lb) IBW/kg (Calculated) : 52.4 Heparin Dosing Weight: 74.7kg   Vital Signs: Temp: 98.2 F (36.8 C) (03/13 1600) Temp Source: Oral (03/13 1600) BP: 128/66 (03/13 1800) Pulse Rate: 94 (03/13 1830)  Labs: Recent Labs    03/16/22 0715 03/16/22 1757  HGB 13.3  --   HCT 39.1  --   PLT 269  --   APTT  --  37*  LABPROT 13.0  --   INR 1.0  --   CREATININE 0.93  --      Estimated Creatinine Clearance: 58.6 mL/min (by C-G formula based on SCr of 0.93 mg/dL).   Assessment: 72 yo F s/p aneurysm embolization on 3/13. Pharmacy consulted for heparin s/p neuroIR x24 hours.  aPTT sub-therapeutic at 37 sec; no bleeding reported.  Goal of Therapy:  aPTT 50-60 seconds Monitor platelets by anticoagulation protocol: Yes   Plan:  Increase IV heparin to 600 units/hr Stop in AM per protocol  Diandra Cimini D. Mina Marble, PharmD, BCPS, [place] 03/16/2022, 7:12 PM

## 2022-03-16 NOTE — Progress Notes (Signed)
ANTICOAGULATION CONSULT NOTE - Initial Consult  Pharmacy Consult for heparin Indication:  post neuro IR  Allergies  Allergen Reactions   Codeine Nausea And Vomiting   Hydrocodone Nausea And Vomiting    Also lightheaded and disoriented   Latex Rash    Patient Measurements: Height: '5\' 3"'$  (160 cm) Weight: 96.2 kg (212 lb) IBW/kg (Calculated) : 52.4 Heparin Dosing Weight: 74.7kg   Vital Signs: Temp: 97.3 F (36.3 C) (03/13 1145) Temp Source: Oral (03/13 0708) BP: 144/56 (03/13 1145) Pulse Rate: 74 (03/13 1145)  Labs: Recent Labs    03/16/22 0715  HGB 13.3  HCT 39.1  PLT 269  LABPROT 13.0  INR 1.0  CREATININE 0.93    Estimated Creatinine Clearance: 58.6 mL/min (by C-G formula based on SCr of 0.93 mg/dL).   Medical History: Past Medical History:  Diagnosis Date   Arthritis    Back pain    Breast cancer ([place]) 2018   left   COVID 2020   mild   Dyspnea    with walking a distance   Family history of breast cancer    Family history of melanoma    Family history of renal cancer    Palpitations    able to get them under control by deep breathing   Personal history of radiation therapy    PONV (postoperative nausea and vomiting)    Racing heart beat    per pt=going on for years, PCP aware per pt.   Seizures ([place])    last seizure 2004  3 total 10 yrs apart   Sleep apnea    Dx 20-30 years ago, does not use CPAP-sleeps with head elevated.   Umbilical hernia    Wears glasses     Assessment: 72 yo F s/p aneurysm embolization on 3/13. Pharmacy consulted for heparin s/p neuroIR x24 hours with target aPTT 50-60s.   Baseline CBC WNL   Goal of Therapy:  aPTT 50-60 seconds Monitor platelets by anticoagulation protocol: Yes   Plan:  Heparin started at 1105 at 500u/hr -check aPTT at 1700  F/u s/sx bleeding and CBC    Wilson Singer, PharmD Clinical Pharmacist 03/16/2022 12:19 PM

## 2022-03-16 NOTE — Sedation Documentation (Signed)
ACT 185 

## 2022-03-16 NOTE — Anesthesia Procedure Notes (Signed)
Procedure Name: Intubation Date/Time: 03/16/2022 9:07 AM  Performed by: Heide Scales, CRNAPre-anesthesia Checklist: Patient identified, Emergency Drugs available, Suction available and Patient being monitored Patient Re-evaluated:Patient Re-evaluated prior to induction Oxygen Delivery Method: Circle system utilized Preoxygenation: Pre-oxygenation with 100% oxygen Induction Type: IV induction and Cricoid Pressure applied Ventilation: Mask ventilation without difficulty and Oral airway inserted - appropriate to patient size Laryngoscope Size: Mac and 3 Grade View: Grade I Tube type: Oral Tube size: 7.5 mm Number of attempts: 1 Airway Equipment and Method: Stylet and Oral airway Placement Confirmation: ETT inserted through vocal cords under direct vision, positive ETCO2 and breath sounds checked- equal and bilateral Secured at: 24 cm Tube secured with: Tape Dental Injury: Teeth and Oropharynx as per pre-operative assessment

## 2022-03-16 NOTE — Sedation Documentation (Signed)
ACT = 201

## 2022-03-16 NOTE — Transfer of Care (Signed)
Immediate Anesthesia Transfer of Care Note  Patient: Virginia Lindsey  Procedure(s) Performed: Cerebral angiogram with aneurysm embolization  Patient Location: PACU  Anesthesia Type:General  Level of Consciousness: awake, alert , and oriented  Airway & Oxygen Therapy: Patient Spontanous Breathing and Patient connected to face mask oxygen  Post-op Assessment: Report given to RN, Post -op Vital signs reviewed and stable, and Patient moving all extremities X 4  Post vital signs: Reviewed and stable  Last Vitals:  Vitals Value Taken Time  BP 188/93 03/16/22 1101  Temp    Pulse 71 03/16/22 1104  Resp 19 03/16/22 1104  SpO2 97 % 03/16/22 1104  Vitals shown include unvalidated device data.  Last Pain:  Vitals:   03/16/22 0708  TempSrc: Oral  PainSc: 0-No pain         Complications: No notable events documented.

## 2022-03-16 NOTE — Anesthesia Postprocedure Evaluation (Signed)
Anesthesia Post Note  Patient: Virginia Lindsey  Procedure(s) Performed: Cerebral angiogram with aneurysm embolization     Patient location during evaluation: PACU Anesthesia Type: General Level of consciousness: awake Pain management: pain level controlled Vital Signs Assessment: post-procedure vital signs reviewed and stable Respiratory status: spontaneous breathing, nonlabored ventilation and respiratory function stable Cardiovascular status: blood pressure returned to baseline and stable Postop Assessment: no apparent nausea or vomiting Anesthetic complications: no   No notable events documented.  Last Vitals:  Vitals:   03/16/22 1130 03/16/22 1145  BP: (!) 157/73 (!) 144/56  Pulse: 74 74  Resp: 13 10  Temp:  (!) 36.3 C  SpO2: 94% 95%    Last Pain:  Vitals:   03/16/22 1145  TempSrc:   PainSc: 0-No pain                 Nilda Simmer

## 2022-03-16 NOTE — Anesthesia Procedure Notes (Signed)
Arterial Line Insertion Start/End3/13/2024 8:30 AM, 03/16/2022 8:35 AM Performed by: Heide Scales, CRNA, CRNA  Patient location: Pre-op. Preanesthetic checklist: patient identified, IV checked, site marked, risks and benefits discussed, surgical consent, monitors and equipment checked, pre-op evaluation, timeout performed and anesthesia consent Lidocaine 1% used for infiltration Right, radial was placed Catheter size: 20 G Hand hygiene performed , maximum sterile barriers used  and Seldinger technique used Allen's test indicative of satisfactory collateral circulation Attempts: 1 Procedure performed without using ultrasound guided technique. Following insertion, dressing applied. Post procedure assessment: normal and unchanged  Patient tolerated the procedure well with no immediate complications.

## 2022-03-16 NOTE — Progress Notes (Signed)
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  Virginia Lindsey denies complaints except that her right eye "feels funny", feels like it has a scratch on it. Reviewed this is likely from occlusive used during anesthesia and should improve with natural tear production over the next day or so. She is tolerating water and ice chips, looking forward to eating some dinner.  Alert, awake, and oriented x4 Speech and comprehension intact PERRL bilaterally EOMs without nystagmus or subjective diplopia. Visual fields grossly in tact No facial asymmetry. Tongue midline Motor power 5/5 all four extremities Negative pronator drift. Fine motor and coordination in tact Right CFA puncture site non tender, no active bleeding, soft  Plan: - Admit for overnight observation, likely able to discharge tomorrow morning if she is stable overnight. NIR will assess AM of 3/14 - Advance diet as tolerated - Continue heparin gtt, start Brilinta 90 mg BID tonight at 2000 - Bed rest with right leg straight until 1630 if groin site not bleeding - May raise HOB to 30 degrees starting at 1530 if groin site not bleeding - CBC/BMP in AM  Please page Dr. Estanislado Pandy with any overnight questions or concerns

## 2022-03-17 ENCOUNTER — Encounter (HOSPITAL_COMMUNITY): Payer: Self-pay | Admitting: Interventional Radiology

## 2022-03-17 LAB — CBC WITH DIFFERENTIAL/PLATELET
Abs Immature Granulocytes: 0.06 10*3/uL (ref 0.00–0.07)
Basophils Absolute: 0 10*3/uL (ref 0.0–0.1)
Basophils Relative: 0 %
Eosinophils Absolute: 0 10*3/uL (ref 0.0–0.5)
Eosinophils Relative: 0 %
HCT: 37.2 % (ref 36.0–46.0)
Hemoglobin: 12.9 g/dL (ref 12.0–15.0)
Immature Granulocytes: 1 %
Lymphocytes Relative: 18 %
Lymphs Abs: 1.8 10*3/uL (ref 0.7–4.0)
MCH: 32.3 pg (ref 26.0–34.0)
MCHC: 34.7 g/dL (ref 30.0–36.0)
MCV: 93.2 fL (ref 80.0–100.0)
Monocytes Absolute: 0.7 10*3/uL (ref 0.1–1.0)
Monocytes Relative: 7 %
Neutro Abs: 7.4 10*3/uL (ref 1.7–7.7)
Neutrophils Relative %: 74 %
Platelets: 250 10*3/uL (ref 150–400)
RBC: 3.99 MIL/uL (ref 3.87–5.11)
RDW: 12.8 % (ref 11.5–15.5)
WBC: 9.9 10*3/uL (ref 4.0–10.5)
nRBC: 0 % (ref 0.0–0.2)

## 2022-03-17 LAB — URINALYSIS, ROUTINE W REFLEX MICROSCOPIC
Bilirubin Urine: NEGATIVE
Glucose, UA: NEGATIVE mg/dL
Hgb urine dipstick: NEGATIVE
Ketones, ur: NEGATIVE mg/dL
Nitrite: NEGATIVE
Protein, ur: NEGATIVE mg/dL
Specific Gravity, Urine: 1.005 (ref 1.005–1.030)
pH: 5 (ref 5.0–8.0)

## 2022-03-17 LAB — MRSA NEXT GEN BY PCR, NASAL: MRSA by PCR Next Gen: NOT DETECTED

## 2022-03-17 LAB — BASIC METABOLIC PANEL
Anion gap: 10 (ref 5–15)
BUN: 9 mg/dL (ref 8–23)
CO2: 19 mmol/L — ABNORMAL LOW (ref 22–32)
Calcium: 8.6 mg/dL — ABNORMAL LOW (ref 8.9–10.3)
Chloride: 113 mmol/L — ABNORMAL HIGH (ref 98–111)
Creatinine, Ser: 0.8 mg/dL (ref 0.44–1.00)
GFR, Estimated: >60 mL/min (ref >60)
Glucose, Bld: 111 mg/dL — ABNORMAL HIGH (ref 70–99)
Potassium: 3.6 mmol/L (ref 3.5–5.1)
Sodium: 142 mmol/L (ref 135–145)

## 2022-03-17 LAB — HEPARIN LEVEL (UNFRACTIONATED): Heparin Unfractionated: <0.1 IU/mL — ABNORMAL LOW (ref 0.30–0.70)

## 2022-03-17 MED ORDER — HEPARIN (PORCINE) 25000 UT/250ML-% IV SOLN: 700.0000 [IU]/h | INTRAVENOUS | Status: AC

## 2022-03-17 MED ORDER — TICAGRELOR 90 MG PO TABS: ORAL_TABLET | ORAL | 2 refills | Status: DC

## 2022-03-17 NOTE — Progress Notes (Signed)
   03/17/22 0116  Provider Notification  Provider Name/Title Dr. Estanislado Pandy  Date Provider Notified 03/17/22  Time Provider Notified 0115  Method of Notification Page  Notification Reason Change in status (ART line waveform became positional and not reading accurately despite nursing interventions.)  Provider response See new orders;Other (Comment) (Cuff pressure use for blood pressure management)  Date of Provider Response 03/17/22  Time of Provider Response [phone]

## 2022-03-17 NOTE — Discharge Summary (Signed)
Patient ID: Virginia Lindsey MRN: [id number] DOB/AGE: [birth year] 72 y.o.  Admit date: 03/16/2022 Discharge date: 03/17/2022  Supervising Physician: Luanne Bras  Patient Status: [facility] - In-pt  Admission Diagnoses: brain aneurysm   Discharge Diagnoses:  Principal Problem:   Brain aneurysm   Discharged Condition: good  Hospital Course:   Patient presented to [facility] for an cerebral angiogram with intervention with Dr. Estanislado Pandy on 03/16/22. Procedure occurred without major complications and patient was transferred to floor in stable condition (extubated w/o difficulty, VSS, R CFA puncture site stable) for overnight observation.   Patient seen this morning with Dr. Rosann Auerbach.  Patient awake and alert, husband at bedside. No complaints at this time, able to tolerate PO w/o difficulty.   Patient has issue with A line not reading accurately overnight, Dr. Estanislado Pandy was reached out and order was given to measure BP using cuff. SBP has been higher than goal, Cleviprex d/c'd this morning and BP 156/68 at 10 am.  Patient's BP prior to the procedure was 150/95, she states that she does not take BP meds at home. Pt currently asymptomatic.   R CFA site clean and dry, no s/s of acute infection of bleeding. Site is soft but tender to touch. R DP 2+ Dr. Estanislado Pandy recommend applying pressure on right groin while stands up or bend over for 7 days. Patient verbalized understanding.   Per RN, patient reported that she gets dysuria/UTI like sx whenever she gets IV contrast, and she has been prescribed abx before. UA ordered, unremarkable. No plan for outpatient abx.  Foley to be removed.   Patient was instructed to: - No bending, stooping, lifting more than 10 pounds for 2 weeks - No driving for 2 weeks  - Keep the R groin puncture site clean and dry until fully heals, no submerging (sitting in a tub, swimming) for 7 days - Continue to take ASA 81 mg daily  - Take Brilinta 90 mg twice a day  until 3/12 (1 week) then start taking Brilinta 45 mg twice a day.   Plan to discharge home today and follow-up with Dr. Estanislado Pandy at [facility] in 2 weeks after discharge.  Consults: None  Significant Diagnostic Studies:   Urinalysis    Component Value Date/Time   COLORURINE STRAW (A) 03/17/2022 0840   APPEARANCEUR CLEAR 03/17/2022 0840   LABSPEC 1.005 03/17/2022 0840   PHURINE 5.0 03/17/2022 0840   GLUCOSEU NEGATIVE 03/17/2022 0840   HGBUR NEGATIVE 03/17/2022 0840   BILIRUBINUR NEGATIVE 03/17/2022 0840   KETONESUR NEGATIVE 03/17/2022 0840   PROTEINUR NEGATIVE 03/17/2022 0840   NITRITE NEGATIVE 03/17/2022 0840   LEUKOCYTESUR SMALL (A) 03/17/2022 0840    CBC    Component Value Date/Time   WBC 9.9 03/17/2022 0555   RBC 3.99 03/17/2022 0555   HGB 12.9 03/17/2022 0555   HGB 14.6 01/07/2022 1026   HCT 37.2 03/17/2022 0555   PLT 250 03/17/2022 0555   PLT 311 01/07/2022 1026   MCV 93.2 03/17/2022 0555   MCH 32.3 03/17/2022 0555   MCHC 34.7 03/17/2022 0555   RDW 12.8 03/17/2022 0555   LYMPHSABS 1.8 03/17/2022 0555   MONOABS 0.7 03/17/2022 0555   EOSABS 0.0 03/17/2022 0555   BASOSABS 0.0 03/17/2022 0555   BMET    Component Value Date/Time   NA 142 03/17/2022 0555   K 3.6 03/17/2022 0555   CL 113 (H) 03/17/2022 0555   CO2 19 (L) 03/17/2022 0555   GLUCOSE 111 (H) 03/17/2022 0555  BUN 9 03/17/2022 0555   CREATININE 0.80 03/17/2022 0555   CREATININE 0.84 01/07/2022 1026   CALCIUM 8.6 (L) 03/17/2022 0555   GFRNONAA >60 03/17/2022 0555   GFRNONAA >60 01/07/2022 1026    Treatments:   Discharge Exam: Blood pressure (!) 156/68, pulse 75, temperature (!) 97.5 F (36.4 C), temperature source Oral, resp. rate 20, height '5\' 3"'$  (1.6 m), weight 212 lb (96.2 kg), SpO2 98 %.  Physical Exam Vitals reviewed.  Constitutional:      General: She is not in acute distress.    Appearance: She is not ill-appearing.  HENT:     Head: Normocephalic.  Cardiovascular:     Rate and  Rhythm: Normal rate and regular rhythm.  Pulmonary:     Effort: Pulmonary effort is normal.  Abdominal:     General: Abdomen is flat.     Palpations: Abdomen is soft.  Skin:    General: Skin is warm and dry.     Coloration: Skin is not jaundiced or pale.     Comments: Positive dressing on R CFA puncture site. Site is soft but tender. Otherwise  unremarkable with no erythema, edema, bleeding or drainage. Dressing is slightly wet with blood, some blood transfers from the dressing to examiner's glove. No active bleeding noted. Dressing otherwise clean, dry, and intact.    Neurological:     Mental Status: She is alert and oriented to person, place, and time.     Comments: Alert, awake, and oriented x 3 Speech and comprehension intact EOMs intact  No facial asymmetry. Motor power 5/5 all 4  No pronator drift. Fine motor and coordination intact R DP 2+    Psychiatric:        Mood and Affect: Mood normal.        Behavior: Behavior normal.     Disposition: Discharge disposition: 01-Home or Self Care       Discharge Instructions     Call MD for:   Complete by: As directed    Call MD for:  difficulty breathing, headache or visual disturbances   Complete by: As directed    Call MD for:  extreme fatigue   Complete by: As directed    Call MD for:  hives   Complete by: As directed    Call MD for:  persistant dizziness or light-headedness   Complete by: As directed    Call MD for:  persistant nausea and vomiting   Complete by: As directed    Call MD for:  redness, tenderness, or signs of infection (pain, swelling, redness, odor or green/yellow discharge around incision site)   Complete by: As directed    Call MD for:  severe uncontrolled pain   Complete by: As directed    Call MD for:  temperature >100.4   Complete by: As directed    Diet - low sodium heart healthy   Complete by: As directed    Discharge wound care:   Complete by: As directed    Keep the right groin  puncture site clean and dry until fully heals, expect it will take about a week. May place a bandage. Can take shower starting 03/18/22. Do not submerge (sitting in a bath tub, swimming) for 7 days.   Driving Restrictions   Complete by: As directed    No driving for 2 weeks.   Increase activity slowly   Complete by: As directed    Lifting restrictions   Complete by: As directed    No  lifting, stooping, bending more than 10 pounds for 2 week. Apply some pressure on right groin when stands up or bend over for 7 days.      Allergies as of 03/17/2022       Reactions   Codeine Nausea And Vomiting   Hydrocodone Nausea And Vomiting   Also lightheaded and disoriented   Latex Rash        Medication List     TAKE these medications    aspirin EC 81 MG tablet Take 81 mg by mouth in the morning. Swallow whole.   B-12 1000 MCG Tabs Take 1,000 mcg by mouth daily.   cholecalciferol 25 MCG (1000 UNIT) tablet Commonly known as: VITAMIN D3 Take 2 tablets (2,000 Units total) by mouth daily.   letrozole 2.5 MG tablet Commonly known as: FEMARA TAKE 1 TABLET BY MOUTH EVERY DAY   ticagrelor 90 MG Tabs tablet Commonly known as: BRILINTA Take 90 mg Brilinta twice a day until 03/24/22, then start taking Brilinta 45 mg twice a day. What changed:  how much to take how to take this when to take this additional instructions   Turmeric 500 MG Caps Commonly known as: QC Tumeric Complex Take 500 mg by mouth daily.               Discharge Care Instructions  (From admission, onward)           Start     Ordered   03/17/22 0000  Discharge wound care:       Comments: Keep the right groin puncture site clean and dry until fully heals, expect it will take about a week. May place a bandage. Can take shower starting 03/18/22. Do not submerge (sitting in a bath tub, swimming) for 7 days.   03/17/22 1027            Follow-up Information     Luanne Bras, MD Follow up.    Specialties: Interventional Radiology, Radiology Why: Follow up visit with Dr. Estanislado Pandy. You will receive a call from our schedulers. Contact information: [address] [place],  [place] [address] [place] [address] [phone]                  Electronically Signed: Tera Mater, PA-C 03/17/2022, 10:31 AM   I have spent Less Than 30 Minutes discharging [name].

## 2022-03-17 NOTE — Progress Notes (Signed)
[facility] [place] for heparin Indication:  post neuro IR  Allergies  Allergen Reactions   Codeine Nausea And Vomiting   Hydrocodone Nausea And Vomiting    Also lightheaded and disoriented   Latex Rash    Patient Measurements: Height: '5\' 3"'$  (160 cm) Weight: 96.2 kg (212 lb) IBW/kg (Calculated) : 52.4 Heparin Dosing Weight: 74.7kg   Vital Signs: Temp: 97.5 F (36.4 C) (03/14 0800) Temp Source: Oral (03/14 0800) BP: 125/61 (03/14 0800) Pulse Rate: 86 (03/14 0800)  Labs: Recent Labs    03/16/22 0715 03/16/22 1757 03/17/22 0555 03/17/22 0810  HGB 13.3  --  12.9  --   HCT 39.1  --  37.2  --   PLT 269  --  250  --   APTT  --  37*  --   --   LABPROT 13.0  --   --   --   INR 1.0  --   --   --   HEPARINUNFRC  --   --   --  <0.10*  CREATININE 0.93  --   --   --      Estimated Creatinine Clearance: 58.6 mL/min (by C-G formula based on SCr of 0.93 mg/dL).   Assessment: 72 yo F s/p aneurysm embolization on 3/13. Pharmacy consulted for heparin s/p neuroIR x24 hours.  HL sub-therapeutic at <0.1; no bleeding reported.  Goal of Therapy:  aPTT 50-60 seconds Monitor platelets by anticoagulation protocol: Yes   Plan:  Increase IV heparin to 700 units/hr Stop at 1200 per protocol  Alanda Slim, PharmD, [facility] Clinical Pharmacist Please see AMION for all Pharmacists' Contact Phone Numbers 03/17/2022, 8:58 AM

## 2022-03-22 ENCOUNTER — Encounter (HOSPITAL_COMMUNITY): Payer: Self-pay

## 2022-03-22 ENCOUNTER — Other Ambulatory Visit (HOSPITAL_COMMUNITY): Payer: Self-pay | Admitting: Interventional Radiology

## 2022-03-22 ENCOUNTER — Telehealth: Payer: Self-pay | Admitting: Physician Assistant

## 2022-03-22 DIAGNOSIS — I671 Cerebral aneurysm, nonruptured: Secondary | ICD-10-CM

## 2022-03-22 HISTORY — PX: IR NEURO EACH ADD'L AFTER BASIC UNI LEFT (MS): IMG5373

## 2022-03-22 NOTE — Telephone Encounter (Signed)
Patient called NIR today with concerns regarding a "knot" near the site of previous arterial line for procedure 03/16/22.  Returned call to patient this afternoon -- she reports a firm area about the size of a nickel approximately 1 inch below the right wrist. It appears to be bruised and is not exquisitely tender. She denies any redness, discoloration, edema. Her hand is not blue/purple. She has full ROM of wrist/hand/fingers. She denies any numbness, tingling or weakness. She is feeling well otherwise except for low back pain that shoots down her leg which has been occurring for over a year.   Discussed with patient that this is likely residual hematoma from multiple attempts at A-line placement by anesthesia and will resolve with time. Recommended warm compress to area x 20 mins Q2H while awake for 48 hours, Tylenol/Advil PRN for pain and swelling, elevate arm when possible. Dr. Estanislado Pandy will assess area at follow up appointment, however patient instructed to call back if anything changes.  Ms. Liberti also requesting that Dr. 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 She states Dr. Saintclair Halsted told her that these may be causing her low back/shooting leg pain but are not usually drained by their service, however he recommended she discuss this with Dr. Estanislado Pandy at her next visit. Will alert Dr. Estanislado Pandy to this request.  Candiss Norse, PA-C

## 2022-03-28 DIAGNOSIS — H903 Sensorineural hearing loss, bilateral: Secondary | ICD-10-CM | POA: Diagnosis not present

## 2022-03-28 DIAGNOSIS — H9313 Tinnitus, bilateral: Secondary | ICD-10-CM | POA: Diagnosis not present

## 2022-03-30 ENCOUNTER — Ambulatory Visit (HOSPITAL_COMMUNITY)
Admission: RE | Admit: 2022-03-30 | Discharge: 2022-03-30 | Disposition: A | Discharge status: Home / Self Care | Payer: Medicare PPO | Source: Ambulatory Visit | Attending: Student | Admitting: Student

## 2022-03-30 DIAGNOSIS — I671 Cerebral aneurysm, nonruptured: Secondary | ICD-10-CM

## 2022-04-01 HISTORY — PX: IR RADIOLOGIST EVAL & MGMT: IMG5224

## 2022-04-04 ENCOUNTER — Telehealth: Payer: Self-pay | Admitting: Student

## 2022-04-04 NOTE — Telephone Encounter (Signed)
Interventional Radiology Brief Note:  Patient called to report "I have a ruptured blood vessel in my eye."  States this has happened before and self-resolved.  She called to notify our office due to being on aspirin and Brilinta for recently aneurysm treatment.    Reports there is no extension of hemorrhage outside the subconjunctiva.  Denies blurry vision or changes in eye movement.  No other signs of bruising, bleeding, oozing, spotting, or poor tolerance of DAPT.  Area with some irritation which she reports is consistent with her experience with previous subconjunctival hemorrhage.   Instructed patient to continue with DAPT for now.  If develops s/s of worsening bleeding, irriration, or extension of bruising notify our office.  She verbalizes understanding.   Brynda Greathouse, MS RD PA-C

## 2022-04-12 ENCOUNTER — Other Ambulatory Visit: Payer: Self-pay | Admitting: *Deleted

## 2022-04-12 DIAGNOSIS — Z170 Estrogen receptor positive status [ER+]: Secondary | ICD-10-CM

## 2022-04-14 ENCOUNTER — Ambulatory Visit (HOSPITAL_COMMUNITY)
Admission: RE | Admit: 2022-04-14 | Discharge: 2022-04-14 | Disposition: A | Discharge status: Home / Self Care | Payer: Medicare PPO | Source: Ambulatory Visit | Attending: Hematology and Oncology | Admitting: Hematology and Oncology

## 2022-04-14 ENCOUNTER — Inpatient Hospital Stay: Payer: Medicare PPO | Attending: Hematology and Oncology

## 2022-04-14 DIAGNOSIS — C3411 Malignant neoplasm of upper lobe, right bronchus or lung: Secondary | ICD-10-CM | POA: Insufficient documentation

## 2022-04-14 DIAGNOSIS — C3490 Malignant neoplasm of unspecified part of unspecified bronchus or lung: Secondary | ICD-10-CM | POA: Diagnosis not present

## 2022-04-14 DIAGNOSIS — Z170 Estrogen receptor positive status [ER+]: Secondary | ICD-10-CM | POA: Diagnosis not present

## 2022-04-14 DIAGNOSIS — R911 Solitary pulmonary nodule: Secondary | ICD-10-CM | POA: Diagnosis not present

## 2022-04-14 DIAGNOSIS — Z79811 Long term (current) use of aromatase inhibitors: Secondary | ICD-10-CM | POA: Diagnosis not present

## 2022-04-14 DIAGNOSIS — Z7982 Long term (current) use of aspirin: Secondary | ICD-10-CM | POA: Diagnosis not present

## 2022-04-14 DIAGNOSIS — Z79899 Other long term (current) drug therapy: Secondary | ICD-10-CM | POA: Insufficient documentation

## 2022-04-14 DIAGNOSIS — C50412 Malignant neoplasm of upper-outer quadrant of left female breast: Secondary | ICD-10-CM | POA: Insufficient documentation

## 2022-04-14 LAB — CBC WITH DIFFERENTIAL (CANCER CENTER ONLY)
Abs Immature Granulocytes: 0.01 10*3/uL (ref 0.00–0.07)
Basophils Absolute: 0 10*3/uL (ref 0.0–0.1)
Basophils Relative: 1 %
Eosinophils Absolute: 0.1 10*3/uL (ref 0.0–0.5)
Eosinophils Relative: 2 %
HCT: 39.8 % (ref 36.0–46.0)
Hemoglobin: 13.6 g/dL (ref 12.0–15.0)
Immature Granulocytes: 0 %
Lymphocytes Relative: 31 %
Lymphs Abs: 1.4 10*3/uL (ref 0.7–4.0)
MCH: 31.8 pg (ref 26.0–34.0)
MCHC: 34.2 g/dL (ref 30.0–36.0)
MCV: 93 fL (ref 80.0–100.0)
Monocytes Absolute: 0.7 10*3/uL (ref 0.1–1.0)
Monocytes Relative: 16 %
Neutro Abs: 2.3 10*3/uL (ref 1.7–7.7)
Neutrophils Relative %: 50 %
Platelet Count: 231 10*3/uL (ref 150–400)
RBC: 4.28 MIL/uL (ref 3.87–5.11)
RDW: 12.5 % (ref 11.5–15.5)
Smear Review: NORMAL
WBC Count: 4.6 10*3/uL (ref 4.0–10.5)
nRBC: 0 % (ref 0.0–0.2)

## 2022-04-14 LAB — CMP (CANCER CENTER ONLY)
ALT: 17 U/L (ref 0–44)
AST: 16 U/L (ref 15–41)
Albumin: 4.2 g/dL (ref 3.5–5.0)
Alkaline Phosphatase: 95 U/L (ref 38–126)
Anion gap: 7 (ref 5–15)
BUN: 13 mg/dL (ref 8–23)
CO2: 27 mmol/L (ref 22–32)
Calcium: 9.5 mg/dL (ref 8.9–10.3)
Chloride: 108 mmol/L (ref 98–111)
Creatinine: 0.92 mg/dL (ref 0.44–1.00)
GFR, Estimated: >60 mL/min (ref >60)
Glucose, Bld: 95 mg/dL (ref 70–99)
Potassium: 3.7 mmol/L (ref 3.5–5.1)
Sodium: 142 mmol/L (ref 135–145)
Total Bilirubin: 0.5 mg/dL (ref 0.3–1.2)
Total Protein: 7.3 g/dL (ref 6.5–8.1)

## 2022-04-14 MED ORDER — IOHEXOL 300 MG/ML  SOLN
75.0000 mL | Freq: Once | INTRAMUSCULAR | Status: AC | PRN
  Administered 2022-04-14: 75 mL via INTRAVENOUS

## 2022-04-14 MED ORDER — SODIUM CHLORIDE (PF) 0.9 % IJ SOLN
INTRAMUSCULAR | Status: AC
  Filled 2022-04-14: qty 50

## 2022-04-18 ENCOUNTER — Telehealth: Payer: Self-pay | Admitting: Hematology and Oncology

## 2022-04-18 DIAGNOSIS — C3490 Malignant neoplasm of unspecified part of unspecified bronchus or lung: Secondary | ICD-10-CM

## 2022-04-18 NOTE — Telephone Encounter (Signed)
I discussed with the patient the results of the CT chest.  There is no evidence of recurrent lung cancer. I recommended another CT scan in 6 months and I will follow-up after the scan.

## 2022-04-25 ENCOUNTER — Telehealth: Payer: Self-pay | Admitting: Hematology and Oncology

## 2022-04-25 NOTE — Telephone Encounter (Signed)
Scheduled and cancelled appointment per scheduling message. Left voicemail.

## 2022-05-09 ENCOUNTER — Other Ambulatory Visit: Payer: Self-pay | Admitting: Family Medicine

## 2022-05-09 DIAGNOSIS — E2839 Other primary ovarian failure: Secondary | ICD-10-CM

## 2022-05-12 ENCOUNTER — Telehealth: Payer: Self-pay | Admitting: *Deleted

## 2022-05-12 NOTE — Telephone Encounter (Signed)
Received VM from pt requesting advice from MD if she is due for bone density test.  RN attempt x1 to return call to pt.  Pt PCP Dr. Tally Joe has placed the order and she could reach out to [facility] and if any issues arise, she should reach out to Dr. Merita Norton office.

## 2022-07-11 DIAGNOSIS — K219 Gastro-esophageal reflux disease without esophagitis: Secondary | ICD-10-CM | POA: Diagnosis not present

## 2022-07-11 DIAGNOSIS — Z1331 Encounter for screening for depression: Secondary | ICD-10-CM | POA: Diagnosis not present

## 2022-07-11 DIAGNOSIS — E7800 Pure hypercholesterolemia, unspecified: Secondary | ICD-10-CM | POA: Diagnosis not present

## 2022-07-11 DIAGNOSIS — I700 Atherosclerosis of aorta: Secondary | ICD-10-CM | POA: Diagnosis not present

## 2022-07-11 DIAGNOSIS — Z0000 Encounter for general adult medical examination without abnormal findings: Secondary | ICD-10-CM | POA: Diagnosis not present

## 2022-07-11 DIAGNOSIS — N390 Urinary tract infection, site not specified: Secondary | ICD-10-CM | POA: Diagnosis not present

## 2022-07-11 DIAGNOSIS — E538 Deficiency of other specified B group vitamins: Secondary | ICD-10-CM | POA: Diagnosis not present

## 2022-07-11 DIAGNOSIS — E559 Vitamin D deficiency, unspecified: Secondary | ICD-10-CM | POA: Diagnosis not present

## 2022-07-25 DIAGNOSIS — R3989 Other symptoms and signs involving the genitourinary system: Secondary | ICD-10-CM | POA: Diagnosis not present

## 2022-07-25 DIAGNOSIS — R399 Unspecified symptoms and signs involving the genitourinary system: Secondary | ICD-10-CM | POA: Diagnosis not present

## 2022-08-02 ENCOUNTER — Other Ambulatory Visit (HOSPITAL_COMMUNITY): Payer: Self-pay | Admitting: Interventional Radiology

## 2022-08-02 DIAGNOSIS — I671 Cerebral aneurysm, nonruptured: Secondary | ICD-10-CM

## 2022-08-04 ENCOUNTER — Ambulatory Visit: Payer: Medicare PPO | Admitting: Hematology and Oncology

## 2022-08-12 ENCOUNTER — Ambulatory Visit (HOSPITAL_COMMUNITY)
Admission: RE | Admit: 2022-08-12 | Discharge: 2022-08-12 | Disposition: A | Discharge status: Home / Self Care | Payer: Medicare PPO | Source: Ambulatory Visit | Attending: Interventional Radiology | Admitting: Interventional Radiology

## 2022-08-12 DIAGNOSIS — I671 Cerebral aneurysm, nonruptured: Secondary | ICD-10-CM | POA: Insufficient documentation

## 2022-08-12 DIAGNOSIS — R9082 White matter disease, unspecified: Secondary | ICD-10-CM | POA: Diagnosis not present

## 2022-08-12 DIAGNOSIS — G319 Degenerative disease of nervous system, unspecified: Secondary | ICD-10-CM | POA: Diagnosis not present

## 2022-08-25 ENCOUNTER — Other Ambulatory Visit (HOSPITAL_COMMUNITY): Payer: Self-pay | Admitting: Interventional Radiology

## 2022-08-25 DIAGNOSIS — I671 Cerebral aneurysm, nonruptured: Secondary | ICD-10-CM

## 2022-08-29 ENCOUNTER — Ambulatory Visit (HOSPITAL_COMMUNITY)
Admission: RE | Admit: 2022-08-29 | Discharge: 2022-08-29 | Disposition: A | Discharge status: Home / Self Care | Payer: Medicare PPO | Source: Ambulatory Visit | Attending: Interventional Radiology | Admitting: Interventional Radiology

## 2022-08-29 DIAGNOSIS — I671 Cerebral aneurysm, nonruptured: Secondary | ICD-10-CM

## 2022-08-31 HISTORY — PX: IR RADIOLOGIST EVAL & MGMT: IMG5224

## 2022-09-02 ENCOUNTER — Other Ambulatory Visit (HOSPITAL_COMMUNITY): Payer: Self-pay | Admitting: Interventional Radiology

## 2022-09-02 DIAGNOSIS — I671 Cerebral aneurysm, nonruptured: Secondary | ICD-10-CM

## 2022-09-16 ENCOUNTER — Other Ambulatory Visit: Payer: Self-pay

## 2022-09-16 ENCOUNTER — Encounter (HOSPITAL_COMMUNITY): Payer: Self-pay | Admitting: Interventional Radiology

## 2022-09-16 ENCOUNTER — Other Ambulatory Visit (HOSPITAL_COMMUNITY): Payer: Self-pay | Admitting: Student

## 2022-09-16 DIAGNOSIS — I671 Cerebral aneurysm, nonruptured: Secondary | ICD-10-CM

## 2022-09-16 NOTE — Progress Notes (Signed)
PCP - Dr Tally Joe Cardiologist - none Hem and Onc - Dr Serena Croissant  CT Chest x-ray - 04/14/22 EKG - 10/14/21 Stress Test - 10/01/21 ECHO - n/a Cardiac Cath - n/a  ICD Pacemaker/Loop - n/a  Sleep Study -  Yes CPAP - does not use CPAP  Diabetes - n/a  Aspirin/Brilinta Instructions: Okay to take Brilinta 90 mg and ASA 81 mg with a sip of water on DOS per [place], [place] in Radiology.    NPO  Anesthesia review: Yes  STOP now taking any Aspirin (unless otherwise instructed by your surgeon), Aleve, Naproxen, Ibuprofen, Motrin, Advil, Goody's, BC's, all herbal medications, fish oil, and all vitamins.   Coronavirus Screening Do you have any of the following symptoms:  Cough yes/no: No Fever (>100.52F)  yes/no: No Runny nose yes/no: No Sore throat yes/no: No Difficulty breathing/shortness of breath  yes/no: No  Have you traveled in the last 14 days and where? yes/no: No  Patient verbalized understanding of instructions that were given via phone.

## 2022-09-16 NOTE — Anesthesia Preprocedure Evaluation (Signed)
Anesthesia Evaluation  Patient identified by MRN, date of birth, ID band Patient awake    Reviewed: Allergy & Precautions, H&P , NPO status , Patient's Chart, lab work & pertinent test results  Airway Mallampati: III  TM Distance: <3 FB Neck ROM: Full    Dental no notable dental hx.    Pulmonary sleep apnea    Pulmonary exam normal breath sounds clear to auscultation       Cardiovascular negative cardio ROS Normal cardiovascular exam Rhythm:Regular Rate:Normal     Neuro/Psych Seizures -, Well Controlled,   negative psych ROS   GI/Hepatic negative GI ROS, Neg liver ROS,,,  Endo/Other  negative endocrine ROS    Renal/GU negative Renal ROS  negative genitourinary   Musculoskeletal negative musculoskeletal ROS (+)    Abdominal   Peds negative pediatric ROS (+)  Hematology negative hematology ROS (+)   Anesthesia Other Findings   Reproductive/Obstetrics negative OB ROS                             Anesthesia Physical Anesthesia Plan  ASA: 3  Anesthesia Plan: General   Post-op Pain Management:    Induction: Intravenous  PONV Risk Score and Plan: 3 and Ondansetron, Dexamethasone and Treatment may vary due to age or medical condition  Airway Management Planned: Oral ETT  Additional Equipment: Arterial line  Intra-op Plan:   Post-operative Plan: Extubation in OR  Informed Consent: I have reviewed the patients History and Physical, chart, labs and discussed the procedure including the risks, benefits and alternatives for the proposed anesthesia with the patient or authorized representative who has indicated his/her understanding and acceptance.     Dental advisory given  Plan Discussed with: CRNA and Surgeon  Anesthesia Plan Comments: (See PAT note from 9/13 by Sherlie Ban PA-C )        Anesthesia Quick Evaluation

## 2022-09-16 NOTE — Progress Notes (Signed)
Case: [id number] Date/Time: 09/19/22 0730   Procedure: Aneurysm embolization   Anesthesia type: General   Pre-op diagnosis: Brain aneurysm I67.1   [facility]   Surgeons: Julieanne Cotton, MD       DISCUSSION: Virginia Lindsey is a 72 year old female who presents as a same-day workup prior to procedure above.  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  Prior anesthesia complications include PONV  Patient previously had a stress test done prior to her RU lung wedge resection. Unclear if she was having any symptoms but indication for order was "Pre-op noncardiac surgery, high risk". Stress test came back as likely low risk. She has had multiple surgeries/procedures since then and done well. Denies CP/SOB during PAT assessment and has good functional status.  She will continue her Brilinta and ASA on DOS per IR  VS:  Wt Readings from Last 3 Encounters:  03/16/22 96.2 kg  01/18/22 96.9 kg  01/13/22 97.1 kg   Temp Readings from Last 3 Encounters:  03/17/22 37.1 C (Oral)  01/18/22 36.8 C (Oral)  01/13/22 36.5 C (Temporal)   BP Readings from Last 3 Encounters:  03/17/22 (!) 147/85  01/18/22 128/74  01/13/22 (!) 160/82   Pulse Readings from Last 3 Encounters:  03/17/22 77  01/18/22 70  01/13/22 64     PROVIDERS: Tally Joe, MD   LABS: Obtain labs DOS (all labs ordered are listed, but only abnormal results are displayed)  Labs Reviewed - No data to display   IMAGES:  MRI/MRA brain 08/12/22:  IMPRESSION: 1. Evidence of flow diverting stent at the left MCA trifurcation without evidence for residual or recurrent aneurysm.  Susceptibility artifact limits evaluation adjacent to the stent. 2. 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 3. No new aneurysms. 4. Stable atrophy and white matter disease. This likely reflects the sequela of chronic microvascular ischemia. 5. No acute intracranial abnormality or significant interval change.   EKG:   CV:  Stress test 10/01/21: Findings are equivocal. The study is low risk.   No ST deviation was noted.   LV perfusion is abnormal. Defect 1: There is a small defect with moderate reduction in uptake present in the apical apex location(s) that is reversible. There is normal wall motion in the defect area. Consistent with ischemia.   Left ventricular function is normal. Nuclear stress EF: 54 %. The left ventricular ejection fraction is normal (55-65%) visually. End diastolic cavity size is normal. End systolic cavity size is normal.   Prior study not available for comparison.   Apical portion mildly reduced perfusion at rest, worsens with stress. However, there is also more adjacent extracardiac activity on stress imaging. Normal wall motion in the area. Test is equivocal, cannot determine if this is ischemia or artifact, but size/location suggests this is an overall low risk study.  Past Medical History:  Diagnosis Date   Arthritis    Back pain    Breast cancer (HCC) 2018   left   COVID 2020   mild   Dyspnea    with walking a distance   Family history of breast cancer    Family history of melanoma    Family history of renal cancer    Palpitations  able to get them under control by deep breathing   Personal history of radiation therapy    PONV (postoperative nausea and vomiting)    Racing heart beat    per pt=going on for years, PCP aware per pt.   Seizures (HCC)    last seizure 2004  3 total 10 yrs apart   Sleep apnea    Dx 20-30 years ago, does not use CPAP-sleeps with head elevated.   Umbilical hernia    Wears glasses     Past Surgical History:   Procedure Laterality Date   ABDOMINAL HYSTERECTOMY  1991   partial hysterectomy   BRAIN SURGERY     BREAST BIOPSY Left 10/29/2012   Procedure:  LEFT BREAST WIRE GUIDED EXCISION;  Surgeon: Emelia Loron, MD;  Location: [facility];  Service: General;  Laterality: Left;   BREAST EXCISIONAL BIOPSY     BREAST LUMPECTOMY Left 12/19/2016   BREAST LUMPECTOMY WITH RADIOACTIVE SEED AND SENTINEL LYMPH NODE BIOPSY Left 12/19/2016   Procedure: LEFT BREAST LUMPECTOMY WITH RADIOACTIVE SEED AND LEFT AXILLARY SENTINEL LYMPH NODE BIOPSY;  Surgeon: Emelia Loron, MD;  Location: [facility];  Service: General;  Laterality: Left;   BREAST SURGERY  2013   biopsy-lt   BRONCHIAL BRUSHINGS  10/18/2021   Procedure: BRONCHIAL BRUSHINGS;  Surgeon: Leslye Peer, MD;  [facility];  Service: Pulmonary;;   BRONCHIAL NEEDLE ASPIRATION BIOPSY  10/18/2021   Procedure: BRONCHIAL NEEDLE ASPIRATION BIOPSIES;  Surgeon: Leslye Peer, MD;  [facility];  Service: Pulmonary;;   CHOLECYSTECTOMY     COLONOSCOPY     FIDUCIAL MARKER PLACEMENT  10/18/2021   Procedure: Bartolo Darter Marking;  Surgeon: Leslye Peer, MD;  [facility];  Service: Pulmonary;;   INTERCOSTAL NERVE BLOCK Right 10/18/2021   Procedure: INTERCOSTAL NERVE BLOCK;  Surgeon: Corliss Skains, MD;  [facility];  Service: Thoracic;  Laterality: Right;   IR 3D INDEPENDENT WKST  03/18/2021   IR 3D INDEPENDENT WKST  04/05/2021   IR ANGIO INTRA EXTRACRAN SEL COM CAROTID INNOMINATE BILAT MOD SED  03/18/2021   IR ANGIO INTRA EXTRACRAN SEL INTERNAL CAROTID UNI L MOD SED  04/05/2021   IR ANGIO INTRA EXTRACRAN SEL INTERNAL CAROTID UNI L MOD SED  03/16/2022   IR ANGIO VERTEBRAL SEL VERTEBRAL BILAT MOD SED  03/18/2021   IR ANGIOGRAM FOLLOW UP STUDY  04/05/2021   IR ANGIOGRAM FOLLOW UP STUDY  03/16/2022   IR CT HEAD LTD  04/05/2021   IR CT HEAD LTD  03/16/2022   IR NEURO EACH ADD'L AFTER BASIC UNI LEFT  (MS)  04/05/2021   IR NEURO EACH ADD'L AFTER BASIC UNI LEFT (MS)  03/22/2022   IR RADIOLOGIST EVAL & MGMT  03/15/2021   IR RADIOLOGIST EVAL & MGMT  03/25/2021   IR RADIOLOGIST EVAL & MGMT  04/21/2021   IR RADIOLOGIST EVAL & MGMT  02/06/2022   IR RADIOLOGIST EVAL & MGMT  04/01/2022   IR RADIOLOGIST EVAL & MGMT  08/31/2022   IR TRANSCATH/EMBOLIZ  04/05/2021   IR TRANSCATH/EMBOLIZ  03/16/2022   LUMBAR LAMINECTOMY/DECOMPRESSION MICRODISCECTOMY Right 08/27/2012   Procedure: RIGHT  LUMBAR FOUR FIVE LAMINECTOMY/DECOMPRESSION MICRODISCECTOMY 1 LEVEL;  Surgeon: Mariam Dollar, MD;  [facility];  Service: Neurosurgery;  Laterality: Right;   RADIOLOGY WITH ANESTHESIA N/A 04/05/2021   Procedure: EMBOLIZATION;  Surgeon: Julieanne Cotton, MD;  [facility];  Service: Radiology;  Laterality: N/A;   RADIOLOGY WITH ANESTHESIA N/A 03/16/2022  Procedure: Cerebral angiogram with aneurysm embolization;  Surgeon: Julieanne Cotton, MD;  Location: [facility] OR;  Service: Radiology;  Laterality: N/A;   UPPER GI ENDOSCOPY     multiple endoscopy's   VIDEO BRONCHOSCOPY WITH RADIAL ENDOBRONCHIAL ULTRASOUND  10/18/2021   Procedure: VIDEO BRONCHOSCOPY WITH RADIAL ENDOBRONCHIAL ULTRASOUND;  Surgeon: Leslye Peer, MD;  Location: [facility];  Service: Pulmonary;;    MEDICATIONS: No current facility-administered medications for this encounter.    aspirin EC 81 MG tablet   cholecalciferol (VITAMIN D3) 25 MCG (1000 UNIT) tablet   Cyanocobalamin (B-12) 1000 MCG TABS   ticagrelor (BRILINTA) 90 MG TABS tablet   letrozole (FEMARA) 2.5 MG tablet   Turmeric (QC TUMERIC COMPLEX) 500 MG CAPS   Marcille Blanco [facility] Surgical Short Stay/Anesthesiology [facility] Phone [phone] 09/16/2022 2:54 PM

## 2022-09-18 ENCOUNTER — Encounter: Payer: Self-pay | Admitting: Radiology

## 2022-09-19 ENCOUNTER — Inpatient Hospital Stay (HOSPITAL_COMMUNITY)
Admission: RE | Admit: 2022-09-19 | Discharge: 2022-09-20 | DRG: 027 | Disposition: A | Discharge status: Home / Self Care | Payer: Medicare PPO | Attending: Interventional Radiology | Admitting: Interventional Radiology

## 2022-09-19 ENCOUNTER — Encounter (HOSPITAL_COMMUNITY): Payer: Self-pay | Admitting: Interventional Radiology

## 2022-09-19 ENCOUNTER — Inpatient Hospital Stay (HOSPITAL_COMMUNITY): Payer: Self-pay | Admitting: Medical

## 2022-09-19 ENCOUNTER — Other Ambulatory Visit: Payer: Self-pay | Admitting: Radiology

## 2022-09-19 ENCOUNTER — Encounter (HOSPITAL_COMMUNITY)
Admission: RE | Disposition: A | Discharge status: Home / Self Care | Payer: Self-pay | Source: Home / Self Care | Attending: Interventional Radiology

## 2022-09-19 ENCOUNTER — Inpatient Hospital Stay (HOSPITAL_COMMUNITY)
Admission: RE | Admit: 2022-09-19 | Discharge: 2022-09-19 | Disposition: A | Discharge status: Home / Self Care | Payer: Medicare PPO | Source: Ambulatory Visit | Attending: Interventional Radiology | Admitting: Interventional Radiology

## 2022-09-19 DIAGNOSIS — Z79899 Other long term (current) drug therapy: Secondary | ICD-10-CM | POA: Diagnosis not present

## 2022-09-19 DIAGNOSIS — Z833 Family history of diabetes mellitus: Secondary | ICD-10-CM | POA: Diagnosis not present

## 2022-09-19 DIAGNOSIS — Z7902 Long term (current) use of antithrombotics/antiplatelets: Secondary | ICD-10-CM | POA: Diagnosis not present

## 2022-09-19 DIAGNOSIS — Z91040 Latex allergy status: Secondary | ICD-10-CM

## 2022-09-19 DIAGNOSIS — Z79811 Long term (current) use of aromatase inhibitors: Secondary | ICD-10-CM

## 2022-09-19 DIAGNOSIS — K219 Gastro-esophageal reflux disease without esophagitis: Secondary | ICD-10-CM | POA: Diagnosis present

## 2022-09-19 DIAGNOSIS — Z807 Family history of other malignant neoplasms of lymphoid, hematopoietic and related tissues: Secondary | ICD-10-CM

## 2022-09-19 DIAGNOSIS — Z808 Family history of malignant neoplasm of other organs or systems: Secondary | ICD-10-CM

## 2022-09-19 DIAGNOSIS — Z923 Personal history of irradiation: Secondary | ICD-10-CM

## 2022-09-19 DIAGNOSIS — Z825 Family history of asthma and other chronic lower respiratory diseases: Secondary | ICD-10-CM

## 2022-09-19 DIAGNOSIS — Z7982 Long term (current) use of aspirin: Secondary | ICD-10-CM | POA: Diagnosis not present

## 2022-09-19 DIAGNOSIS — G4733 Obstructive sleep apnea (adult) (pediatric): Secondary | ICD-10-CM | POA: Diagnosis not present

## 2022-09-19 DIAGNOSIS — I671 Cerebral aneurysm, nonruptured: Secondary | ICD-10-CM

## 2022-09-19 DIAGNOSIS — Z8616 Personal history of COVID-19: Secondary | ICD-10-CM

## 2022-09-19 DIAGNOSIS — Z902 Acquired absence of lung [part of]: Secondary | ICD-10-CM

## 2022-09-19 DIAGNOSIS — Z8051 Family history of malignant neoplasm of kidney: Secondary | ICD-10-CM

## 2022-09-19 DIAGNOSIS — Z809 Family history of malignant neoplasm, unspecified: Secondary | ICD-10-CM

## 2022-09-19 DIAGNOSIS — Z885 Allergy status to narcotic agent status: Secondary | ICD-10-CM | POA: Diagnosis not present

## 2022-09-19 DIAGNOSIS — G4730 Sleep apnea, unspecified: Secondary | ICD-10-CM

## 2022-09-19 DIAGNOSIS — Z853 Personal history of malignant neoplasm of breast: Secondary | ICD-10-CM

## 2022-09-19 DIAGNOSIS — Z85118 Personal history of other malignant neoplasm of bronchus and lung: Secondary | ICD-10-CM | POA: Diagnosis not present

## 2022-09-19 DIAGNOSIS — Z803 Family history of malignant neoplasm of breast: Secondary | ICD-10-CM

## 2022-09-19 HISTORY — PX: RADIOLOGY WITH ANESTHESIA: SHX6223

## 2022-09-19 HISTORY — PX: IR CT HEAD LTD: IMG2386

## 2022-09-19 HISTORY — PX: IR ANGIO INTRA EXTRACRAN SEL INTERNAL CAROTID UNI R MOD SED: IMG5362

## 2022-09-19 HISTORY — PX: IR ANGIO INTRA EXTRACRAN SEL COM CAROTID INNOMINATE UNI L MOD SED: IMG5358

## 2022-09-19 HISTORY — PX: IR ANGIOGRAM FOLLOW UP STUDY: IMG697

## 2022-09-19 HISTORY — PX: IR TRANSCATH/EMBOLIZ: IMG695

## 2022-09-19 HISTORY — PX: IR NEURO EACH ADD'L AFTER BASIC UNI LEFT (MS): IMG5373

## 2022-09-19 LAB — COMPREHENSIVE METABOLIC PANEL
ALT: 15 U/L (ref 0–44)
AST: 16 U/L (ref 15–41)
Albumin: 3.8 g/dL (ref 3.5–5.0)
Alkaline Phosphatase: 98 U/L (ref 38–126)
Anion gap: 13 (ref 5–15)
BUN: 15 mg/dL (ref 8–23)
CO2: 21 mmol/L — ABNORMAL LOW (ref 22–32)
Calcium: 9 mg/dL (ref 8.9–10.3)
Chloride: 109 mmol/L (ref 98–111)
Creatinine, Ser: 0.91 mg/dL (ref 0.44–1.00)
GFR, Estimated: >60 mL/min (ref >60)
Glucose, Bld: 86 mg/dL (ref 70–99)
Potassium: 3.2 mmol/L — ABNORMAL LOW (ref 3.5–5.1)
Sodium: 143 mmol/L (ref 135–145)
Total Bilirubin: 0.7 mg/dL (ref 0.3–1.2)
Total Protein: 7.1 g/dL (ref 6.5–8.1)

## 2022-09-19 LAB — CBC WITH DIFFERENTIAL/PLATELET
Abs Immature Granulocytes: 0.03 10*3/uL (ref 0.00–0.07)
Basophils Absolute: 0 10*3/uL (ref 0.0–0.1)
Basophils Relative: 1 %
Eosinophils Absolute: 0.1 10*3/uL (ref 0.0–0.5)
Eosinophils Relative: 2 %
HCT: 42.2 % (ref 36.0–46.0)
Hemoglobin: 14.3 g/dL (ref 12.0–15.0)
Immature Granulocytes: 1 %
Lymphocytes Relative: 29 %
Lymphs Abs: 1.7 10*3/uL (ref 0.7–4.0)
MCH: 32.3 pg (ref 26.0–34.0)
MCHC: 33.9 g/dL (ref 30.0–36.0)
MCV: 95.3 fL (ref 80.0–100.0)
Monocytes Absolute: 0.6 10*3/uL (ref 0.1–1.0)
Monocytes Relative: 10 %
Neutro Abs: 3.2 10*3/uL (ref 1.7–7.7)
Neutrophils Relative %: 57 %
Platelets: 247 10*3/uL (ref 150–400)
RBC: 4.43 MIL/uL (ref 3.87–5.11)
RDW: 13.2 % (ref 11.5–15.5)
WBC: 5.6 10*3/uL (ref 4.0–10.5)
nRBC: 0 % (ref 0.0–0.2)

## 2022-09-19 LAB — POCT ACTIVATED CLOTTING TIME
Activated Clotting Time: 226 s
Activated Clotting Time: 165 s
Activated Clotting Time: 171 s

## 2022-09-19 LAB — MRSA NEXT GEN BY PCR, NASAL: MRSA by PCR Next Gen: NOT DETECTED

## 2022-09-19 LAB — PROTIME-INR
INR: 1 (ref 0.8–1.2)
Prothrombin Time: 13.2 s (ref 11.4–15.2)

## 2022-09-19 SURGERY — IR WITH ANESTHESIA
Anesthesia: General

## 2022-09-19 MED ORDER — PROPOFOL 10 MG/ML IV BOLUS
INTRAVENOUS | Status: DC | PRN
  Administered 2022-09-19: 150 mg via INTRAVENOUS
  Administered 2022-09-19: 50 mg via INTRAVENOUS

## 2022-09-19 MED ORDER — CEFAZOLIN SODIUM-DEXTROSE 2-4 GM/100ML-% IV SOLN
2.0000 g | INTRAVENOUS | Status: AC
  Administered 2022-09-19: 2 g via INTRAVENOUS
  Filled 2022-09-19: qty 100

## 2022-09-19 MED ORDER — CHLORHEXIDINE GLUCONATE 0.12 % MT SOLN: 15.0000 mL | Freq: Once | OROMUCOSAL | Status: AC

## 2022-09-19 MED ORDER — IOHEXOL 300 MG/ML  SOLN
150.0000 mL | Freq: Once | INTRAMUSCULAR | Status: AC | PRN
  Administered 2022-09-19: 25 mL via INTRA_ARTERIAL

## 2022-09-19 MED ORDER — OXYCODONE HCL 5 MG PO TABS: 5.0000 mg | ORAL_TABLET | Freq: Once | ORAL | Status: DC | PRN

## 2022-09-19 MED ORDER — IOHEXOL 300 MG/ML  SOLN
150.0000 mL | Freq: Once | INTRAMUSCULAR | Status: AC | PRN
  Administered 2022-09-19: 75 mL via INTRA_ARTERIAL

## 2022-09-19 MED ORDER — CLEVIDIPINE BUTYRATE 0.5 MG/ML IV EMUL
INTRAVENOUS | Status: AC
  Filled 2022-09-19: qty 50

## 2022-09-19 MED ORDER — ACETAMINOPHEN 160 MG/5ML PO SOLN: 650.0000 mg | ORAL | Status: DC | PRN

## 2022-09-19 MED ORDER — EPHEDRINE SULFATE-NACL 50-0.9 MG/10ML-% IV SOSY
PREFILLED_SYRINGE | INTRAVENOUS | Status: DC | PRN
  Administered 2022-09-19 (×3): 5 mg via INTRAVENOUS

## 2022-09-19 MED ORDER — SODIUM CHLORIDE 0.9 % IV SOLN: INTRAVENOUS | Status: DC

## 2022-09-19 MED ORDER — ROCURONIUM BROMIDE 10 MG/ML (PF) SYRINGE
PREFILLED_SYRINGE | INTRAVENOUS | Status: DC | PRN
  Administered 2022-09-19: 10 mg via INTRAVENOUS
  Administered 2022-09-19: 60 mg via INTRAVENOUS
  Administered 2022-09-19: 30 mg via INTRAVENOUS
  Administered 2022-09-19: 20 mg via INTRAVENOUS

## 2022-09-19 MED ORDER — IOHEXOL 300 MG/ML  SOLN
100.0000 mL | Freq: Once | INTRAMUSCULAR | Status: AC | PRN
  Administered 2022-09-19: 25 mL via INTRA_ARTERIAL

## 2022-09-19 MED ORDER — TICAGRELOR 90 MG PO TABS
90.0000 mg | ORAL_TABLET | Freq: Two times a day (BID) | ORAL | Status: DC
  Administered 2022-09-19 – 2022-09-20 (×2): 90 mg via ORAL
  Filled 2022-09-19 (×2): qty 1

## 2022-09-19 MED ORDER — ORAL CARE MOUTH RINSE: 15.0000 mL | Freq: Once | OROMUCOSAL | Status: AC

## 2022-09-19 MED ORDER — FENTANYL CITRATE (PF) 100 MCG/2ML IJ SOLN: 25.0000 ug | INTRAMUSCULAR | Status: DC | PRN

## 2022-09-19 MED ORDER — OXYCODONE HCL 5 MG/5ML PO SOLN: 5.0000 mg | Freq: Once | ORAL | Status: DC | PRN

## 2022-09-19 MED ORDER — ONDANSETRON HCL 4 MG/2ML IJ SOLN
INTRAMUSCULAR | Status: DC | PRN
  Administered 2022-09-19: 4 mg via INTRAVENOUS

## 2022-09-19 MED ORDER — NITROGLYCERIN 1 MG/10 ML FOR IR/CATH LAB
INTRA_ARTERIAL | Status: AC
  Filled 2022-09-19: qty 10

## 2022-09-19 MED ORDER — DEXAMETHASONE SODIUM PHOSPHATE 10 MG/ML IJ SOLN
INTRAMUSCULAR | Status: DC | PRN
  Administered 2022-09-19: 5 mg via INTRAVENOUS

## 2022-09-19 MED ORDER — CLEVIDIPINE BUTYRATE 0.5 MG/ML IV EMUL
0.0000 mg/h | INTRAVENOUS | Status: AC
  Administered 2022-09-19: 3 mg/h via INTRAVENOUS
  Administered 2022-09-19: 2 mg/h via INTRAVENOUS
  Administered 2022-09-20: 3 mg/h via INTRAVENOUS
  Filled 2022-09-19 (×4): qty 50

## 2022-09-19 MED ORDER — EPTIFIBATIDE 20 MG/10ML IV SOLN
INTRAVENOUS | Status: AC
  Filled 2022-09-19: qty 10

## 2022-09-19 MED ORDER — ASPIRIN 81 MG PO CHEW: 81.0000 mg | CHEWABLE_TABLET | Freq: Every day | ORAL | Status: DC

## 2022-09-19 MED ORDER — ACETAMINOPHEN 325 MG PO TABS
650.0000 mg | ORAL_TABLET | ORAL | Status: DC | PRN
  Administered 2022-09-19: 650 mg via ORAL
  Filled 2022-09-19: qty 2

## 2022-09-19 MED ORDER — HEPARIN (PORCINE) 25000 UT/250ML-% IV SOLN
500.0000 [IU]/h | INTRAVENOUS | Status: DC
  Administered 2022-09-19: 500 [IU]/h via INTRAVENOUS
  Filled 2022-09-19: qty 250

## 2022-09-19 MED ORDER — TICAGRELOR 90 MG PO TABS: 90.0000 mg | ORAL_TABLET | Freq: Two times a day (BID) | ORAL | Status: DC

## 2022-09-19 MED ORDER — EPTIFIBATIDE 20 MG/10ML IV SOLN
INTRAVENOUS | Status: AC | PRN
Start: 2022-09-19 — End: 2022-09-19
  Administered 2022-09-19 (×3): 1.5 mg via INTRA_ARTERIAL

## 2022-09-19 MED ORDER — ONDANSETRON HCL 4 MG/2ML IJ SOLN: 4.0000 mg | Freq: Once | INTRAMUSCULAR | Status: DC | PRN

## 2022-09-19 MED ORDER — LABETALOL HCL 5 MG/ML IV SOLN
INTRAVENOUS | Status: DC | PRN
Start: 2022-09-19 — End: 2022-09-19
  Administered 2022-09-19: 10 mg via INTRAVENOUS

## 2022-09-19 MED ORDER — HEPARIN SODIUM (PORCINE) 1000 UNIT/ML IJ SOLN
INTRAMUSCULAR | Status: DC | PRN
Start: 2022-09-19 — End: 2022-09-19
  Administered 2022-09-19: 500 [IU] via INTRAVENOUS
  Administered 2022-09-19: 3000 [IU] via INTRAVENOUS
  Administered 2022-09-19: 1000 [IU] via INTRAVENOUS

## 2022-09-19 MED ORDER — FENTANYL CITRATE (PF) 100 MCG/2ML IJ SOLN
INTRAMUSCULAR | Status: AC
  Filled 2022-09-19: qty 2

## 2022-09-19 MED ORDER — HEPARIN (PORCINE) 25000 UT/250ML-% IV SOLN
600.0000 [IU]/h | INTRAVENOUS | Status: DC
  Administered 2022-09-19: 600 [IU]/h via INTRAVENOUS

## 2022-09-19 MED ORDER — NIMODIPINE 30 MG PO CAPS
0.0000 mg | ORAL_CAPSULE | ORAL | Status: AC
  Administered 2022-09-19: 30 mg via ORAL
  Filled 2022-09-19: qty 1

## 2022-09-19 MED ORDER — PHENYLEPHRINE HCL-NACL 20-0.9 MG/250ML-% IV SOLN
INTRAVENOUS | Status: DC | PRN
  Administered 2022-09-19: 30 ug/min via INTRAVENOUS

## 2022-09-19 MED ORDER — CHLORHEXIDINE GLUCONATE 0.12 % MT SOLN
OROMUCOSAL | Status: AC
  Administered 2022-09-19: 15 mL via OROMUCOSAL
  Filled 2022-09-19: qty 15

## 2022-09-19 MED ORDER — SUGAMMADEX SODIUM 200 MG/2ML IV SOLN
INTRAVENOUS | Status: DC | PRN
  Administered 2022-09-19: 200 mg via INTRAVENOUS

## 2022-09-19 MED ORDER — ASPIRIN 325 MG PO TBEC: 325.0000 mg | DELAYED_RELEASE_TABLET | ORAL | Status: DC

## 2022-09-19 MED ORDER — CEFAZOLIN SODIUM-DEXTROSE 2-4 GM/100ML-% IV SOLN
INTRAVENOUS | Status: AC
  Filled 2022-09-19: qty 100

## 2022-09-19 MED ORDER — LACTATED RINGERS IV SOLN: INTRAVENOUS | Status: DC

## 2022-09-19 MED ORDER — ASPIRIN 81 MG PO CHEW
81.0000 mg | CHEWABLE_TABLET | Freq: Every day | ORAL | Status: DC
  Administered 2022-09-20: 81 mg via ORAL
  Filled 2022-09-19: qty 1

## 2022-09-19 MED ORDER — HEPARIN (PORCINE) 25000 UT/250ML-% IV SOLN
INTRAVENOUS | Status: AC
  Filled 2022-09-19: qty 250

## 2022-09-19 MED ORDER — PHENYLEPHRINE 80 MCG/ML (10ML) SYRINGE FOR IV PUSH (FOR BLOOD PRESSURE SUPPORT)
PREFILLED_SYRINGE | INTRAVENOUS | Status: DC | PRN
  Administered 2022-09-19 (×2): 160 ug via INTRAVENOUS

## 2022-09-19 MED ORDER — ACETAMINOPHEN 650 MG RE SUPP: 650.0000 mg | RECTAL | Status: DC | PRN

## 2022-09-19 MED ORDER — FENTANYL CITRATE (PF) 250 MCG/5ML IJ SOLN
INTRAMUSCULAR | Status: DC | PRN
  Administered 2022-09-19 (×2): 50 ug via INTRAVENOUS

## 2022-09-19 NOTE — Sedation Documentation (Signed)
Care per anesthesia at this time. Nickolas, CRNA at bedside. See CRNA documentation.

## 2022-09-19 NOTE — Progress Notes (Signed)
Orthopedic Tech Progress Note Patient Details:  Virginia Lindsey 1950/12/06 [id number]  Ortho Devices Type of Ortho Device: Knee Immobilizer Ortho Device/Splint Location: RLE Ortho Device/Splint Interventions: Application, Ordered   Post Interventions Patient Tolerated: Well Instructions Provided: Adjustment of device  Fines Kimberlin A Courtlynn Holloman 09/19/2022, 5:50 PM

## 2022-09-19 NOTE — H&P (Signed)
The note originally documented on this encounter has been moved the the encounter in which it belongs.

## 2022-09-19 NOTE — H&P (Signed)
Chief Complaint: Patient was seen in consultation today for (R)MCA aneurysm    Supervising Physician: Julieanne Cotton  Patient Status: [facility]  History of Present Illness: Virginia Lindsey is a 72 y.o. 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 At that time a 3.6 mm right MCA bifurcation region aneurysm was also noted.   The right MCA trifurcation region aneurysm now measures approximately 5 mm on the present study compared to the initial study of 01/26/2022.   Given the changes on the MRA of the right middle cerebral artery region aneurysm, the option of endovascular treatment with a view to obliterating the aneurysm and thereby eliminating the risk of future rupture or growth was reviewed with patient and the husband.  The pt is now scheduled for intervention today.  PMHx, meds, labs, imaging, allergies reviewed. Feels well, no recent fevers, chills, illness. Has been NPO today as directed. Husband at bedside.   Past Medical History:  Diagnosis Date   Arthritis    Back pain    Breast cancer (HCC) 2018   left   COVID 2020   mild   Dyspnea    hx with walking a distance   Family history of breast cancer    Family history of melanoma    Family history of renal cancer    Lung cancer (HCC) 10/2021   wedge resection   Palpitations    able to get them under control by deep breathing   Personal history of radiation therapy    PONV (postoperative nausea and vomiting)    Racing heart beat    per pt=going on for years, PCP aware per pt.   Seizures (HCC)    last seizure 2004  3 total 10 yrs apart   Sleep apnea    Dx 20-30 years ago, does not use CPAP-sleeps with head elevated.   Umbilical hernia    Wears glasses     Past Surgical History:  Procedure Laterality Date   ABDOMINAL HYSTERECTOMY  1991   partial hysterectomy   BRAIN  SURGERY     BREAST BIOPSY Left 10/29/2012   Procedure:  LEFT BREAST WIRE GUIDED EXCISION;  Surgeon: Emelia Loron, MD;  Location: [facility];  Service: General;  Laterality: Left;   BREAST EXCISIONAL BIOPSY     BREAST LUMPECTOMY Left 12/19/2016   BREAST LUMPECTOMY WITH RADIOACTIVE SEED AND SENTINEL LYMPH NODE BIOPSY Left 12/19/2016   Procedure: LEFT BREAST LUMPECTOMY WITH RADIOACTIVE SEED AND LEFT AXILLARY SENTINEL LYMPH NODE BIOPSY;  Surgeon: Emelia Loron, MD;  Location: [facility];  Service: General;  Laterality: Left;   BREAST SURGERY  2013   biopsy-lt   BRONCHIAL BRUSHINGS  10/18/2021   Procedure: BRONCHIAL BRUSHINGS;  Surgeon: Leslye Peer, MD;  [facility] ENDOSCOPY;  Service: Pulmonary;;   BRONCHIAL NEEDLE ASPIRATION BIOPSY  10/18/2021   Procedure: BRONCHIAL NEEDLE ASPIRATION BIOPSIES;  Surgeon: Leslye Peer, MD;  [facility];  Service: Pulmonary;;   CHOLECYSTECTOMY     COLONOSCOPY     FIDUCIAL MARKER PLACEMENT  10/18/2021   Procedure: Bartolo Darter Marking;  Surgeon: Leslye Peer, MD;  [facility];  Service: Pulmonary;;   INTERCOSTAL NERVE BLOCK Right 10/18/2021   Procedure: INTERCOSTAL NERVE BLOCK;  Surgeon: Corliss Skains, MD;  [facility];  Service: Thoracic;  Laterality: Right;   IR 3D INDEPENDENT WKST  03/18/2021   IR 3D  INDEPENDENT WKST  04/05/2021   IR ANGIO INTRA EXTRACRAN SEL COM CAROTID INNOMINATE BILAT MOD SED  03/18/2021   IR ANGIO INTRA EXTRACRAN SEL INTERNAL CAROTID UNI L MOD SED  04/05/2021   IR ANGIO INTRA EXTRACRAN SEL INTERNAL CAROTID UNI L MOD SED  03/16/2022   IR ANGIO VERTEBRAL SEL VERTEBRAL BILAT MOD SED  03/18/2021   IR ANGIOGRAM FOLLOW UP STUDY  04/05/2021   IR ANGIOGRAM FOLLOW UP STUDY  03/16/2022   IR CT HEAD LTD  04/05/2021   IR CT HEAD LTD  03/16/2022   IR NEURO EACH ADD'L AFTER BASIC UNI LEFT (MS)  04/05/2021   IR NEURO EACH ADD'L AFTER BASIC UNI LEFT (MS)  03/22/2022   IR  RADIOLOGIST EVAL & MGMT  03/15/2021   IR RADIOLOGIST EVAL & MGMT  03/25/2021   IR RADIOLOGIST EVAL & MGMT  04/21/2021   IR RADIOLOGIST EVAL & MGMT  02/06/2022   IR RADIOLOGIST EVAL & MGMT  04/01/2022   IR RADIOLOGIST EVAL & MGMT  08/31/2022   IR TRANSCATH/EMBOLIZ  04/05/2021   IR TRANSCATH/EMBOLIZ  03/16/2022   LUMBAR LAMINECTOMY/DECOMPRESSION MICRODISCECTOMY Right 08/27/2012   Procedure: RIGHT  LUMBAR FOUR FIVE LAMINECTOMY/DECOMPRESSION MICRODISCECTOMY 1 LEVEL;  Surgeon: Mariam Dollar, MD;  [facility];  Service: Neurosurgery;  Laterality: Right;   RADIOLOGY WITH ANESTHESIA N/A 04/05/2021   Procedure: EMBOLIZATION;  Surgeon: Julieanne Cotton, MD;  [facility];  Service: Radiology;  Laterality: N/A;   RADIOLOGY WITH ANESTHESIA N/A 03/16/2022   Procedure: Cerebral angiogram with aneurysm embolization;  Surgeon: Julieanne Cotton, MD;  Location: [facility] OR;  Service: Radiology;  Laterality: N/A;   UPPER GI ENDOSCOPY     multiple endoscopy's   VIDEO BRONCHOSCOPY WITH RADIAL ENDOBRONCHIAL ULTRASOUND  10/18/2021   Procedure: VIDEO BRONCHOSCOPY WITH RADIAL ENDOBRONCHIAL ULTRASOUND;  Surgeon: Leslye Peer, MD;  [facility];  Service: Pulmonary;;    Allergies: Codeine, Hydrocodone, and Latex  Medications: Prior to Admission medications   Medication Sig Start Date End Date Taking? Authorizing Provider  aspirin EC 81 MG tablet Take 81 mg by mouth in the morning. Swallow whole.    [provider]  cholecalciferol (VITAMIN D3) 25 MCG (1000 UNIT) tablet Take 2 tablets (2,000 Units total) by mouth daily. 10/19/21   Gold, Wayne E, PA-C  Cyanocobalamin (B-12) 1000 MCG TABS Take 1,000 mcg by mouth every other day. 08/01/21   [provider]  letrozole (FEMARA) 2.5 MG tablet TAKE 1 TABLET BY MOUTH EVERY DAY Patient not taking: Reported on 09/06/2022 07/02/21   Serena Croissant, MD  ticagrelor (BRILINTA) 90 MG TABS tablet Take 90 mg Brilinta twice a day until 03/24/22, then  start taking Brilinta 45 mg twice a day. Patient taking differently: Take 45 mg by mouth 2 (two) times daily. Take 90 mg Brilinta twice a day until 03/24/22, then start taking Brilinta 45 mg twice a day. 03/17/22   Han, Aimee H, PA-C  Turmeric (QC TUMERIC COMPLEX) 500 MG CAPS Take 500 mg by mouth daily. Patient not taking: Reported on 03/11/2022 01/13/22   Serena Croissant, MD     Family History  Problem Relation Age of Onset   Asthma Mother    Cancer Mother        renal cell carcinoma, dx in her 40s, d. 16   Cancer Father 66       melanoma on face; d. 2   Diabetes Maternal Grandmother    Breast cancer Sister 13  d. 66   Breast cancer Paternal Aunt 50       dbl mastectomy   Cancer Paternal Uncle        NOS   Melanoma Paternal Aunt    Breast cancer Paternal Aunt    Melanoma Cousin        pat first cousin; daughter of aunt with melanoma   Breast cancer Cousin    Lymphoma Son 4       [place] Lymphoma    Social History   Socioeconomic History   Marital status: Married    Spouse name: Not on file   Number of children: 4   Years of education: Not on file   Highest education level: Not on file  Occupational History   Not on file  Tobacco Use   Smoking status: Never   Smokeless tobacco: Never  Vaping Use   Vaping status: Never Used  Substance and Sexual Activity   Alcohol use: Never   Drug use: Never   Sexual activity: Not Currently    Birth control/protection: Surgical    Comment: Hysterectomy  Other Topics Concern   Not on file  Social History Narrative   Not on file   Social Determinants of Health   Financial Resource Strain: Not on file  Food Insecurity: Not on file  Transportation Needs: Not on file  Physical Activity: Not on file  Stress: Not on file  Social Connections: Not on file    Review of Systems: A 12 point ROS discussed and pertinent positives are indicated in the HPI above.  All other systems are negative.  Review of Systems  Vital  Signs: There were no vitals taken for this visit.  Physical Exam Constitutional:      Appearance: Normal appearance.  HENT:     Mouth/Throat:     Mouth: Mucous membranes are moist.     Pharynx: Oropharynx is clear.  Cardiovascular:     Rate and Rhythm: Normal rate and regular rhythm.     Pulses: Normal pulses.     Heart sounds: Normal heart sounds.  Pulmonary:     Effort: Pulmonary effort is normal. No respiratory distress.     Breath sounds: Normal breath sounds.  Neurological:     General: No focal deficit present.     Mental Status: She is alert and oriented to person, place, and time.  Psychiatric:        Mood and Affect: Mood normal.        Thought Content: Thought content normal.     Imaging: IR Radiologist Eval & Mgmt  Result Date: 08/31/2022 EXAM: ESTABLISHED PATIENT OFFICE VISIT CHIEF COMPLAINT: Follow-up to recent MRI MRA of the brain. Current Pain Level: 1-10 HISTORY OF PRESENT ILLNESS: The patient is a 72 year old lady who returns for follow-up accompanied by her husband to discuss the findings of the most recent MRI MRA of the brain. 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 At that time a 3.6 mm right MCA bifurcation region aneurysm was also noted. The most recent MRI of the brain demonstrates no evidence of residual or recurrent aneurysm at the left MCA trifurcation region although susceptibility artifact precludes fine detail adjacent to the flow diversion. The right MCA trifurcation region aneurysm measures approximately 5 mm on the present study compared to the initial study of 01/26/2022. The MRI of the brain demonstrates no parenchymal interval changes of ischemia, or hemorrhage. Given  the changes on the MRA of the right middle cerebral artery region aneurysm, the option of endovascular treatment with a view to obliterating the aneurysm and thereby  eliminating the risk of future rupture or growth was reviewed with patient and the husband. The endovascular option depends on the angio architecture of the right middle cerebral artery aneurysm at the time of a proper diagnostic catheter arteriogram. This may entail primary coiling versus stent assisted coiling versus use of an intra saccular flow disrupted diversion versus a flow diversion. The procedure was again discussed in detail would be very similar to the one performed on the left side. Risk of 1-2% of thromboembolic stroke, and less likely intra procedural rupture with resultant worsening neurological deficit and fatality was again reviewed. Procedure would be under general anesthesia via a trans radial or transfemoral route. The patient will be required to continue taking her dual antiplatelets she presently is. Overnight the patient will be admitted to the neuro ICU for close observations and be discharged the following day, barring any complications. The conservative options of continued surveillance with regular neuroimaging studies was also discussed regarding the right middle cerebral artery untreated aneurysm. Questions were answered to the patient's and her husband's questions. Patient has decided to proceed with endovascular treatment of unruptured right MCA aneurysm as discussed above. Past Medical History: Unchanged. Medications: Unchanged. Allergies: Unchanged. Social History: Unchanged. Family History: Unchanged. REVIEW OF SYSTEMS: Negative unless as mentioned above. PHYSICAL EXAMINATION: Remains alert, awake oriented to time, place, space. Normal eye contact. Grossly no abnormal lateralizing neurologic abnormalities evident. Station and gait normal. ASSESSMENT AND PLAN: Will plan endovascular treatment of the right middle cerebral artery trifurcation region aneurysm with general anesthesia in the second half of September 2024. Patient advised to maintain adequate hydration. Patient and spouse  asked to call for further questions or concerns. Electronically Signed   By: Julieanne Cotton M.D.   On: 08/31/2022 08:03    Labs:  CBC: Recent Labs    03/16/22 0715 03/17/22 0555 04/14/22 0812 09/19/22 0620  WBC 6.6 9.9 4.6 5.6  HGB 13.3 12.9 13.6 14.3  HCT 39.1 37.2 39.8 42.2  PLT 269 250 231 247    COAGS: Recent Labs    10/14/21 0900 03/16/22 0715 03/16/22 1757  INR 1.0 1.0  --   APTT 28  --  37*    BMP: Recent Labs    01/07/22 1026 03/16/22 0715 03/17/22 0555 04/14/22 0812  NA 142 141 142 142  K 3.8 3.3* 3.6 3.7  CL 107 105 113* 108  CO2 29 21* 19* 27  GLUCOSE 83 97 111* 95  BUN 16 13 9 13   CALCIUM 9.4 9.0 8.6* 9.5  CREATININE 0.84 0.93 0.80 0.92  GFRNONAA >60 >60 >60 >60    LIVER FUNCTION TESTS: Recent Labs    10/14/21 0900 01/07/22 1026 04/14/22 0812  BILITOT 0.8 0.5 0.5  AST 17 11* 16  ALT 18 10 17   ALKPHOS 97 98 95  PROT 7.1 7.0 7.3  ALBUMIN 3.8 4.2 4.2     Assessment and Plan: (R)MCA trifurcation region aneurysm. Plan for cerebral angiogram and intervention. Overnight observation. Labs pending. Risks and benefits of cerebral angiogram with endovascular treatment of (R)MCA anuerysm were discussed with the patient including, but not limited to bleeding, infection, vascular injury or contrast induced renal failure.  This interventional procedure involves the use of X-rays and because of the nature of the planned procedure, it is possible that we will have prolonged use of  X-ray fluoroscopy.  Potential radiation risks to you include (but are not limited to) the following: - A slightly elevated risk for cancer  several years later in life. This risk is typically less than 0.5% percent. This risk is low in comparison to the normal incidence of human cancer, which is 33% for women and 50% for men according to the [employer] Society. - Radiation induced injury can include skin redness, resembling a rash, tissue breakdown / ulcers and  hair loss (which can be temporary or permanent).   The likelihood of either of these occurring depends on the difficulty of the procedure and whether you are sensitive to radiation due to previous procedures, disease, or genetic conditions.   IF your procedure requires a prolonged use of radiation, you will be notified and given written instructions for further action.  It is your responsibility to monitor the irradiated area for the 2 weeks following the procedure and to notify your physician if you are concerned that you have suffered a radiation induced injury.    All of the patient's questions were answered, patient is agreeable to proceed.  Consent signed and in chart.     Electronically Signed: Brayton El, PA-C 09/19/2022, 7:29 AM   I spent a total of 30 minutes in face to face in clinical consultation, greater than 50% of which was counseling/coordinating care for (R)MCA aneurysm tx

## 2022-09-19 NOTE — Sedation Documentation (Signed)
ACT 171

## 2022-09-19 NOTE — Sedation Documentation (Signed)
ACT: 165

## 2022-09-19 NOTE — Anesthesia Postprocedure Evaluation (Signed)
Anesthesia Post Note  Patient: Virginia Lindsey  Procedure(s) Performed: Aneurysm embolization     Patient location during evaluation: PACU Anesthesia Type: General Level of consciousness: awake and alert Pain management: pain level controlled Vital Signs Assessment: post-procedure vital signs reviewed and stable Respiratory status: spontaneous breathing, nonlabored ventilation, respiratory function stable and patient connected to nasal cannula oxygen Cardiovascular status: blood pressure returned to baseline and stable Postop Assessment: no apparent nausea or vomiting Anesthetic complications: no  No notable events documented.  Last Vitals:  Vitals:   09/19/22 1130 09/19/22 1145  BP: 120/67 133/71  Pulse: 72 71  Resp: 12 13  Temp:  36.7 C  SpO2: 93% 96%    Last Pain:  Vitals:   09/19/22 1145  TempSrc:   PainSc: 0-No pain                 Jemya Depierro S

## 2022-09-19 NOTE — Anesthesia Procedure Notes (Signed)
Procedure Name: Intubation Date/Time: 09/19/2022 8:36 AM  Performed by: Sandie Ano, CRNAPre-anesthesia Checklist: Patient identified, Emergency Drugs available, Suction available and Patient being monitored Patient Re-evaluated:Patient Re-evaluated prior to induction Oxygen Delivery Method: Circle System Utilized Preoxygenation: Pre-oxygenation with 100% oxygen Induction Type: IV induction Ventilation: Mask ventilation without difficulty Laryngoscope Size: Mac and 3 Grade View: Grade I Tube type: Oral Tube size: 7.0 mm Number of attempts: 1 Airway Equipment and Method: Stylet and Oral airway Placement Confirmation: ETT inserted through vocal cords under direct vision, positive ETCO2 and breath sounds checked- equal and bilateral Secured at: 23 cm Tube secured with: Tape Dental Injury: Teeth and Oropharynx as per pre-operative assessment

## 2022-09-19 NOTE — Sedation Documentation (Signed)
ACT = 226

## 2022-09-19 NOTE — Progress Notes (Signed)
ANTICOAGULATION CONSULT NOTE - Initial Consult  Pharmacy Consult for heparin  Indication:  post-neuro IR   Allergies  Allergen Reactions   Codeine Nausea And Vomiting   Hydrocodone Nausea And Vomiting    Also lightheaded and disoriented   Latex Rash    Patient Measurements: Height: 5\' 3"  (160 cm) Weight: 95.3 kg (210 lb) IBW/kg (Calculated) : 52.4 Heparin Dosing Weight: 74.4kg   Vital Signs: Temp: 98.1 F (36.7 C) (09/16 1145) Temp Source: Oral (09/16 0624) BP: 133/71 (09/16 1145) Pulse Rate: 71 (09/16 1145)  Labs: Recent Labs    09/19/22 0620  HGB 14.3  HCT 42.2  PLT 247  LABPROT 13.2  INR 1.0  CREATININE 0.91    Estimated Creatinine Clearance: 59.6 mL/min (by C-G formula based on SCr of 0.91 mg/dL).   Medical History: Past Medical History:  Diagnosis Date   Arthritis    Back pain    Breast cancer (HCC) 2018   left   COVID 2020   mild   Dyspnea    hx with walking a distance   Family history of breast cancer    Family history of melanoma    Family history of renal cancer    Lung cancer (HCC) 10/2021   wedge resection   Palpitations    able to get them under control by deep breathing   Personal history of radiation therapy    PONV (postoperative nausea and vomiting)    Racing heart beat    per pt=going on for years, PCP aware per pt.   Seizures (HCC)    last seizure 2004  3 total 10 yrs apart   Sleep apnea    Dx 20-30 years ago, does not use CPAP-sleeps with head elevated.   Umbilical hernia    Wears glasses     Assessment: Patient admitted following MCA aneurysm repair with flow diverter. On Brilinta PTA with last dose this AM. CBC stable. Pharmacy consulted to dose heparin post IR.   Goal of Therapy:  Heparin level 0.1-0.25 Monitor platelets by anticoagulation protocol: Yes   Plan:  Increase heparin from 500u/hr (6.7u/kg) to  600u/hr (8u/kg).  Check anti-Xa level in 8 hours and daily while on heparin Continue to monitor H&H and  platelets Stop time of 9/17 AM continued.   Estill Batten, PharmD, BCCCP  09/19/2022,12:18 PM

## 2022-09-19 NOTE — Procedures (Signed)
  The note originally documented on this encounter has been moved the the encounter in which it belongs.

## 2022-09-19 NOTE — Procedures (Signed)
INR.  Status post bilateral common carotid arteriograms.  Right CFA approach.  Findings.  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  Status post endovascular treatment of the right MCA bifurcation aneurysm using a 3.5 mm x 14 mm  FRED X flow diverter device. Post CT brain no  hemorrhage complications.  8 French Angio-Seal closure device was deployed at the right groin puncture site.  Distal pulses all present bilaterally unchanged from prior to procedure.  Patient extubated.  Denies any headaches nausea  or vomiting.  Pupils 2 mm bilaterally sluggishly reactive.  EOMS full.  Tongue midline.  No facial asymmetry.  Moves all fours equally proportional to effort.  Fatima Sanger MD.

## 2022-09-19 NOTE — Progress Notes (Signed)
Patient's Vascular site began to slowly ooze blood.  Dressing was changed to better visualize puncture site.  Pressure was held for 15 minutes until RN was assured that there was no more bleeding.  Dr. Corliss Skains was paged.  MD came bedside and assessed his patient.  Orders were given for a knee immobilizer and to stay flat until 7pm.

## 2022-09-19 NOTE — Transfer of Care (Signed)
Immediate Anesthesia Transfer of Care Note  Patient: Virginia Lindsey  Procedure(s) Performed: Aneurysm embolization  Patient Location: PACU  Anesthesia Type:General  Level of Consciousness: awake and patient cooperative  Airway & Oxygen Therapy: Patient Spontanous Breathing  Post-op Assessment: Report given to RN and Post -op Vital signs reviewed and stable  Post vital signs: Reviewed and stable  Last Vitals:  Vitals Value Taken Time  BP 117/68 09/19/22 1118  Temp    Pulse 72 09/19/22 1119  Resp 15 09/19/22 1119  SpO2 94 % 09/19/22 1119  Vitals shown include unfiled device data.  Last Pain:  Vitals:   09/19/22 0642  TempSrc:   PainSc: 0-No pain         Complications: No notable events documented.

## 2022-09-19 NOTE — Progress Notes (Signed)
72 y.o. female outpatient. History of seizures breast cancer, lung cancer. 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 Priscille Heidelberg. Found to have changes to right middle cerebral artery region. MRA Head from 8.19.24. reads evidence of flow diverting stent at the left MCA trifurcation without evidence for residual or recurrent aneurysm. Susceptibility artifact limits evaluation adjacent to the stent. 2. 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 Patient was seen in [facility] clinic with Dr. Corliss Skains on 8.28.24 for follow up. It was decided to proceed with an endovascular right MCA anyersym embolization. Procedure performed on 9.16.24 with Dr. Julieanne Cotton. Per procedure notes endovascular treatment of the right MCA bifurcation aneurysm using a 3.5 mm x 14 mm  FRED X flow diverter device. Post CT brain no  hemorrhage complications.   Patient seen at bedside in ICU with Dr. Julieanne Cotton. Patient endorsing a "slight headache" and lower abdominal pain she states is related to the uncomfortable bed.  Denies n/v, chest pain, dyspnea, vision changes, speech difficulties. Neuro exam unchanged from pre-procedure, no deficits noted. Alert, aware and oriented X 3, Speech clear and comprehension is intact., No facial droop noted. Can spontaneously move all 4 extremities.   Per Bedside RN right CFA puncture site had minimal amount of oozing. Dressing was changed and currently clean, dry, dressed appropriately, no active bleeding, soft, appropriately tender to palpation.   Plan: - Bedrest, Extended HOB flat  time until at least 1900 tonight due to oozing at access site. -  Foley to remain overnight - May have sips of water and then advance diet to full liquids if right CFA puncture site stable at 1930 assessment - Heparin gtt (managed by pharmacy) - Brilinta 90 mg BID starting tonight at 2000, ASA 81 mg QD starting tomorrow  1000 - NIR will assess patient in AM for potential discharge   Please call Dr. Julieanne Cotton with overnight concerns.

## 2022-09-20 ENCOUNTER — Encounter (HOSPITAL_COMMUNITY): Payer: Self-pay | Admitting: Interventional Radiology

## 2022-09-20 LAB — CBC WITH DIFFERENTIAL/PLATELET
Abs Immature Granulocytes: 0.03 10*3/uL (ref 0.00–0.07)
Basophils Absolute: 0 10*3/uL (ref 0.0–0.1)
Basophils Relative: 0 %
Eosinophils Absolute: 0 10*3/uL (ref 0.0–0.5)
Eosinophils Relative: 0 %
HCT: 34.6 % — ABNORMAL LOW (ref 36.0–46.0)
Hemoglobin: 11.5 g/dL — ABNORMAL LOW (ref 12.0–15.0)
Immature Granulocytes: 0 %
Lymphocytes Relative: 20 %
Lymphs Abs: 1.5 10*3/uL (ref 0.7–4.0)
MCH: 31.1 pg (ref 26.0–34.0)
MCHC: 33.2 g/dL (ref 30.0–36.0)
MCV: 93.5 fL (ref 80.0–100.0)
Monocytes Absolute: 0.8 10*3/uL (ref 0.1–1.0)
Monocytes Relative: 10 %
Neutro Abs: 5.2 10*3/uL (ref 1.7–7.7)
Neutrophils Relative %: 70 %
Platelets: 223 10*3/uL (ref 150–400)
RBC: 3.7 MIL/uL — ABNORMAL LOW (ref 3.87–5.11)
RDW: 13.2 % (ref 11.5–15.5)
WBC: 7.5 10*3/uL (ref 4.0–10.5)
nRBC: 0 % (ref 0.0–0.2)

## 2022-09-20 LAB — BASIC METABOLIC PANEL
Anion gap: 8 (ref 5–15)
BUN: 5 mg/dL — ABNORMAL LOW (ref 8–23)
CO2: 20 mmol/L — ABNORMAL LOW (ref 22–32)
Calcium: 8.1 mg/dL — ABNORMAL LOW (ref 8.9–10.3)
Chloride: 113 mmol/L — ABNORMAL HIGH (ref 98–111)
Creatinine, Ser: 0.72 mg/dL (ref 0.44–1.00)
GFR, Estimated: >60 mL/min (ref >60)
Glucose, Bld: 99 mg/dL (ref 70–99)
Potassium: 3.5 mmol/L (ref 3.5–5.1)
Sodium: 141 mmol/L (ref 135–145)

## 2022-09-20 LAB — HEPARIN LEVEL (UNFRACTIONATED): Heparin Unfractionated: <0.1 [IU]/mL — ABNORMAL LOW (ref 0.30–0.70)

## 2022-09-20 MED ORDER — TICAGRELOR 90 MG PO TABS: 90.0000 mg | ORAL_TABLET | Freq: Two times a day (BID) | ORAL | 0 refills | Status: AC

## 2022-09-20 MED ORDER — HEPARIN (PORCINE) 25000 UT/250ML-% IV SOLN: 750.0000 [IU]/h | INTRAVENOUS | Status: DC

## 2022-09-20 NOTE — Discharge Planning (Signed)
Discharge instructions given. Patient verbalizes understanding. PAtient with no complaints at this time. Spouse at bedside. Will discharge via WC to car.

## 2022-09-20 NOTE — Discharge Summary (Signed)
Patient ID: Virginia Lindsey MRN: [id number] DOB/AGE: 74-Dec-1952 74 y.o.  Admit date: 09/19/2022 Discharge date: 09/20/2022  Supervising Physician: Julieanne Cotton  Patient Status: [facility] - [facility]pt  Admission Diagnoses: MCA aneurysm  Discharge Diagnoses:  Principal Problem:   Cerebral aneurysm Active Problems:   Brain aneurysm   Discharged Condition: good  Hospital Course:   Virginia Lindsey is a 72 y.o. female with history of seizures, breast cancer, and lung cancer.   She is s/p left middle cerebral artery trifurcation region aneurysm s/p flow diversion device placement on 03/16/22 by Dr. Corliss Skains .   MRA Head from 08/22/22 showed flow diverting stent at the left MCA trifurcation without evidence for residual or recurrent aneurysm and a stable 5 mm right MCA trifurcation aneurysm.   She was seen in [facility] clinic with Dr. Corliss Skains on 08/31/22 for follow up.   It was decided to proceed with an endovascular right MCA anyersym embolization.   She presented yesterday for the procedure.  Per procedure notes - endovascular treatment of the right MCA bifurcation aneurysm using a 3.5 mm x 14 mm  FRED X flow diverter device.   Post CT brain no  hemorrhage complications.   She had trace oozing from the right groin access site yesterday and straight leg time was extended.  She is doing well this morning. She is sitting up in bed. She is tolerating a diet.  Today she is sitting up in bed. C/o mild "fullness" in her forehead, but no headache.  She will ambulate prior to discharge.  Consults: None  Significant Diagnostic Studies:   Lab Results  Component Value Date   NA 141 09/20/2022   CL 113 (H) 09/20/2022   K 3.5 09/20/2022   CO2 20 (L) 09/20/2022   BUN 5 (L) 09/20/2022   CREATININE 0.72 09/20/2022   GFRNONAA >60 09/20/2022   CALCIUM 8.1 (L) 09/20/2022   ALBUMIN 3.8 09/19/2022   GLUCOSE 99 09/20/2022   Lab Results  Component Value Date   WBC 7.5 09/20/2022   HGB  11.5 (L) 09/20/2022   HCT 34.6 (L) 09/20/2022   MCV 93.5 09/20/2022   PLT 223 09/20/2022     Treatments:  Status post bilateral common carotid arteriograms.   Right CFA approach.   Findings.   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   Status post endovascular treatment of the right MCA bifurcation aneurysm using a 3.5 mm x 14 mm  FRED X flow diverter device. Post CT brain no  hemorrhage complications.   8 French Angio-Seal closure device was deployed at the right groin puncture site.  Discharge Exam: Blood pressure 107/69, pulse 79, temperature 97.7 F (36.5 C), temperature source Axillary, resp. rate 14, height 5\' 3"  (1.6 m), weight 210 lb (95.3 kg), SpO2 98%. Alert, awake, and oriented x3. Speech and comprehension intact. PERRL bilaterally. EOMs intact bilaterally without nystagmus or subjective diplopia. Visual fields intact bilaterally. No facial asymmetry. Tongue midline. Motor power symmetric proportional to effort. No pronator drift. Fine motor and coordination intact and symmetric. 5/5 Strength bilaterally Common femoral artery puncture site looks good, no bleeding, no hematoma, no pseudoaneurysm  Disposition: Discharge disposition: 01-Home or Self Care       Discharge Instructions     Call MD for:  persistant dizziness or light-headedness   Complete by: As directed    Call MD for:  persistant nausea and vomiting   Complete by: As directed    Call  MD for:  redness, tenderness, or signs of infection (pain, swelling, redness, odor or green/yellow discharge around incision site)   Complete by: As directed    Call MD for:  severe uncontrolled pain   Complete by: As directed    Call MD for:  temperature >100.4   Complete by: As directed    Diet general   Complete by: As directed    Discharge instructions   Complete by: As directed    Do not bend, stoop, push, pull anything greater than 10 pounds (~gallon milk) for 2  weeks. Do not drive for 2 weeks. Ok to ride in a car.   Increase activity slowly   Complete by: As directed       Allergies as of 09/20/2022       Reactions   Codeine Nausea And Vomiting   Hydrocodone Nausea And Vomiting   Also lightheaded and disoriented   Latex Rash        Medication List     TAKE these medications    aspirin EC 81 MG tablet Take 81 mg by mouth in the morning. Swallow whole.   B-12 1000 MCG Tabs Take 1,000 mcg by mouth every other day.   cholecalciferol 25 MCG (1000 UNIT) tablet Commonly known as: VITAMIN D3 Take 2 tablets (2,000 Units total) by mouth daily.   letrozole 2.5 MG tablet Commonly known as: FEMARA TAKE 1 TABLET BY MOUTH EVERY DAY   ticagrelor 90 MG Tabs tablet Commonly known as: BRILINTA Take 90 mg Brilinta twice a day until 03/24/22, then start taking Brilinta 45 mg twice a day. What changed:  how much to take how to take this when to take this   Turmeric 500 MG Caps Commonly known as: QC Tumeric Complex Take 500 mg by mouth daily.        *NIR scheduler will call patient with follow up appointment time and date. She will be seen in about 2 weeks*  Electronically Signed: Gwynneth Macleod, PA-C 09/20/2022, 9:19 AM     I have spent Greater Than 30 Minutes discharging Suzan Slick.

## 2022-09-20 NOTE — Progress Notes (Signed)
ANTICOAGULATION CONSULT NOTE  Pharmacy Consult for heparin  Indication:  post-neuro IR   Allergies  Allergen Reactions   Codeine Nausea And Vomiting   Hydrocodone Nausea And Vomiting    Also lightheaded and disoriented   Latex Rash    Patient Measurements: Height: 5\' 3"  (160 cm) Weight: 95.3 kg (210 lb) IBW/kg (Calculated) : 52.4 Heparin Dosing Weight: 74.4kg   Vital Signs: Temp: 98.3 F (36.8 C) (09/17 0000) Temp Source: Oral (09/17 0000) BP: 106/56 (09/17 0000) Pulse Rate: 72 (09/17 0030)  Labs: Recent Labs    09/19/22 0620 09/20/22 0020  HGB 14.3  --   HCT 42.2  --   PLT 247  --   LABPROT 13.2  --   INR 1.0  --   HEPARINUNFRC  --  <0.10*  CREATININE 0.91  --     Estimated Creatinine Clearance: 59.6 mL/min (by C-G formula based on SCr of 0.91 mg/dL).   Medical History: Past Medical History:  Diagnosis Date   Arthritis    Back pain    Breast cancer (HCC) 2018   left   COVID 2020   mild   Dyspnea    hx with walking a distance   Family history of breast cancer    Family history of melanoma    Family history of renal cancer    Lung cancer (HCC) 10/2021   wedge resection   Palpitations    able to get them under control by deep breathing   Personal history of radiation therapy    PONV (postoperative nausea and vomiting)    Racing heart beat    per pt=going on for years, PCP aware per pt.   Seizures (HCC)    last seizure 2004  3 total 10 yrs apart   Sleep apnea    Dx 20-30 years ago, does not use CPAP-sleeps with head elevated.   Umbilical hernia    Wears glasses     Assessment: Patient admitted following MCA aneurysm repair with flow diverter. On Brilinta PTA with last dose this AM. CBC stable. Pharmacy consulted to dose heparin post IR.   Heparin level undetectable on infusion at 600 units/hr. No issues with line or bleeding reported per RN.  Goal of Therapy:  Heparin level 0.1-0.25 units/hr Monitor platelets by anticoagulation protocol:  Yes   Plan:  Increase heparin 750 units/hr  No further levels needed with stop time of 9/17 0800  Christoper Fabian, PharmD, BCPS Please see amion for complete clinical pharmacist phone list 09/20/2022,1:00 AM

## 2022-09-27 ENCOUNTER — Telehealth: Payer: Self-pay | Admitting: Emergency Medicine

## 2022-09-27 NOTE — Telephone Encounter (Signed)
Patient having CT scan and seeing Dr. Pamelia Hoit in October 2024. Would like to know when she should see Dr. Delton Coombes. Patient phone number is [phone].

## 2022-09-28 ENCOUNTER — Telehealth: Payer: Self-pay | Admitting: *Deleted

## 2022-09-28 ENCOUNTER — Other Ambulatory Visit: Payer: Self-pay | Admitting: *Deleted

## 2022-09-28 DIAGNOSIS — C3411 Malignant neoplasm of upper lobe, right bronchus or lung: Secondary | ICD-10-CM

## 2022-09-28 DIAGNOSIS — R1084 Generalized abdominal pain: Secondary | ICD-10-CM

## 2022-09-28 DIAGNOSIS — C3490 Malignant neoplasm of unspecified part of unspecified bronchus or lung: Secondary | ICD-10-CM

## 2022-09-28 DIAGNOSIS — Z170 Estrogen receptor positive status [ER+]: Secondary | ICD-10-CM

## 2022-09-28 NOTE — Telephone Encounter (Signed)
Received call from pt with complaint of ongoing abdominal pain/ pressure.  Per MD pt is schedule for CT chest and would need CT abdomen for further evaluation of pain/ pressure.  Orders added, pt will contact central scheduling to schedule appt.

## 2022-09-30 NOTE — Telephone Encounter (Signed)
Patient will call back to schedule appt for April due getting off rotation with CT scan.

## 2022-10-03 ENCOUNTER — Ambulatory Visit (HOSPITAL_COMMUNITY)
Admission: RE | Admit: 2022-10-03 | Discharge: 2022-10-03 | Disposition: A | Discharge status: Home / Self Care | Payer: Medicare PPO | Source: Ambulatory Visit | Attending: Radiology | Admitting: Radiology

## 2022-10-03 DIAGNOSIS — I671 Cerebral aneurysm, nonruptured: Secondary | ICD-10-CM | POA: Diagnosis not present

## 2022-10-07 HISTORY — PX: IR RADIOLOGIST EVAL & MGMT: IMG5224

## 2022-10-14 ENCOUNTER — Encounter (HOSPITAL_COMMUNITY): Payer: Self-pay

## 2022-10-14 ENCOUNTER — Ambulatory Visit (HOSPITAL_COMMUNITY)
Admission: RE | Admit: 2022-10-14 | Discharge: 2022-10-14 | Disposition: A | Discharge status: Home / Self Care | Payer: Medicare PPO | Source: Ambulatory Visit | Attending: Hematology and Oncology | Admitting: Hematology and Oncology

## 2022-10-14 DIAGNOSIS — R1084 Generalized abdominal pain: Secondary | ICD-10-CM | POA: Diagnosis not present

## 2022-10-14 DIAGNOSIS — C50412 Malignant neoplasm of upper-outer quadrant of left female breast: Secondary | ICD-10-CM | POA: Diagnosis not present

## 2022-10-14 DIAGNOSIS — C3411 Malignant neoplasm of upper lobe, right bronchus or lung: Secondary | ICD-10-CM | POA: Diagnosis not present

## 2022-10-14 DIAGNOSIS — C3490 Malignant neoplasm of unspecified part of unspecified bronchus or lung: Secondary | ICD-10-CM | POA: Diagnosis not present

## 2022-10-14 DIAGNOSIS — I700 Atherosclerosis of aorta: Secondary | ICD-10-CM | POA: Diagnosis not present

## 2022-10-14 DIAGNOSIS — Z170 Estrogen receptor positive status [ER+]: Secondary | ICD-10-CM | POA: Diagnosis not present

## 2022-10-14 DIAGNOSIS — K5750 Diverticulosis of both small and large intestine without perforation or abscess without bleeding: Secondary | ICD-10-CM | POA: Diagnosis not present

## 2022-10-14 DIAGNOSIS — K7689 Other specified diseases of liver: Secondary | ICD-10-CM | POA: Diagnosis not present

## 2022-10-14 MED ORDER — SODIUM CHLORIDE (PF) 0.9 % IJ SOLN
INTRAMUSCULAR | Status: AC
  Filled 2022-10-14: qty 50

## 2022-10-14 MED ORDER — IOHEXOL 300 MG/ML  SOLN
100.0000 mL | Freq: Once | INTRAMUSCULAR | Status: AC | PRN
  Administered 2022-10-14: 100 mL via INTRAVENOUS

## 2022-10-17 ENCOUNTER — Telehealth (HOSPITAL_COMMUNITY): Payer: Self-pay

## 2022-10-17 ENCOUNTER — Telehealth: Payer: Self-pay

## 2022-10-17 DIAGNOSIS — H00014 Hordeolum externum left upper eyelid: Secondary | ICD-10-CM | POA: Diagnosis not present

## 2022-10-17 NOTE — Telephone Encounter (Signed)
Per Anders Grant, NP - pt should f/u with PCP for eye swelling. Pt will give them a call to get in. AB

## 2022-10-17 NOTE — Telephone Encounter (Signed)
-----   Message from Noreene Filbert sent at 10/17/2022  8:34 AM EDT ----- Scans are good. No cancer recurrence.  She needs to get her appt with Dr. Pamelia Hoit reschedueld, they have her ont he schedule still on 10/21/2022. ----- Message ----- From: Interface, Rad Results In Sent: 10/14/2022   1:59 PM EDT To: Serena Croissant, MD

## 2022-10-17 NOTE — Telephone Encounter (Signed)
Called Pt. Per Np about her scans and sent message to scheduling about updated appt. with Gudena. Appt. From 10/18 needs to be changed.

## 2022-10-17 NOTE — Telephone Encounter (Signed)
Pt called today with complaints of new left eye swelling, tenderness, and pain. I have sent a message to our neuro PA today to discuss with Dr. Corliss Skains. AB

## 2022-10-21 ENCOUNTER — Inpatient Hospital Stay: Payer: Medicare PPO | Admitting: Hematology and Oncology

## 2022-10-22 DIAGNOSIS — H00014 Hordeolum externum left upper eyelid: Secondary | ICD-10-CM | POA: Diagnosis not present

## 2022-10-31 ENCOUNTER — Telehealth (HOSPITAL_COMMUNITY): Payer: Self-pay

## 2022-10-31 ENCOUNTER — Telehealth (HOSPITAL_COMMUNITY): Payer: Self-pay | Admitting: Student

## 2022-10-31 MED ORDER — TICAGRELOR 90 MG PO TABS: 45.0000 mg | ORAL_TABLET | Freq: Two times a day (BID) | ORAL | 2 refills | Status: AC

## 2022-10-31 NOTE — Telephone Encounter (Signed)
Pt called for Brilinta refill. I've sent a message to our PA today to call this in. AB

## 2022-10-31 NOTE — Telephone Encounter (Signed)
Brilinta 45 mg BID x 90 days e-prescribed to patient's [facility]. Patient aware.  Alwyn Ren, [place] [phone] 10/31/2022, 11:09 AM

## 2022-11-01 ENCOUNTER — Inpatient Hospital Stay: Payer: Medicare PPO | Attending: Hematology and Oncology | Admitting: Hematology and Oncology

## 2022-11-01 VITALS — BP 162/81 | HR 68 | Temp 97.3°F | Resp 18 | Ht 63.0 in | Wt 211.6 lb

## 2022-11-01 DIAGNOSIS — C50412 Malignant neoplasm of upper-outer quadrant of left female breast: Secondary | ICD-10-CM | POA: Diagnosis not present

## 2022-11-01 DIAGNOSIS — C3490 Malignant neoplasm of unspecified part of unspecified bronchus or lung: Secondary | ICD-10-CM

## 2022-11-01 DIAGNOSIS — K7689 Other specified diseases of liver: Secondary | ICD-10-CM | POA: Insufficient documentation

## 2022-11-01 DIAGNOSIS — C3411 Malignant neoplasm of upper lobe, right bronchus or lung: Secondary | ICD-10-CM | POA: Diagnosis not present

## 2022-11-01 DIAGNOSIS — Z170 Estrogen receptor positive status [ER+]: Secondary | ICD-10-CM | POA: Insufficient documentation

## 2022-11-01 DIAGNOSIS — Z85118 Personal history of other malignant neoplasm of bronchus and lung: Secondary | ICD-10-CM | POA: Insufficient documentation

## 2022-11-01 DIAGNOSIS — Z79811 Long term (current) use of aromatase inhibitors: Secondary | ICD-10-CM | POA: Insufficient documentation

## 2022-11-01 DIAGNOSIS — Z923 Personal history of irradiation: Secondary | ICD-10-CM | POA: Insufficient documentation

## 2022-11-01 NOTE — Progress Notes (Signed)
Patient Care Team: Tally Joe, MD as PCP - General (Family Medicine) Richarda Overlie, MD as Consulting Physician (Obstetrics and Gynecology) Serena Croissant, MD as Consulting Physician (Hematology and Oncology) Lonie Peak, MD as Attending Physician (Radiation Oncology) Emelia Loron, MD as Consulting Physician (General Surgery)  DIAGNOSIS:  Encounter Diagnoses  Name Primary?   Malignant neoplasm of upper-outer quadrant of left breast in female, estrogen receptor positive (HCC) Yes   Primary cancer of right upper lobe of lung (HCC)    Malignant neoplasm of unspecified part of unspecified bronchus or lung (HCC)     SUMMARY OF ONCOLOGIC HISTORY: Oncology History  Malignant neoplasm of upper-outer quadrant of left breast in female, estrogen receptor positive (HCC)  12/02/2016 Initial Diagnosis   IDC grade 1 ER/PR pos, Her 2 Neg   12/19/2016 Surgery   Left Lumpectomy: Grade 1 IDC, 0.6 cm, Er 100%, PR 100%, Her 2 Neg, Ki 67: 3%, T1bN0 (stage 1A)   01/24/2017 - 02/20/2017 Radiation Therapy   Adjuvant radiation therapy   03/15/2017 Genetic Testing    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 This gene mutation increases your risk to develop melanoma and kidney cancer.  There were no genetic changes in the following genes:   ALK, APC, ATM, AXIN2,BAP1,  BARD1, BLM, BMPR1A, BRCA1, BRCA2, BRIP1, CASR, CDC73, CDH1, CDK4, CDKN1B, CDKN1C, CDKN2A (p14ARF), CDKN2A (p16INK4a), CEBPA, CHEK2, CTNNA1, DICER1, DIS3L2, EGFR (c.2369C>T, p.Thr790Met variant only), EPCAM (Deletion/duplication testing only), FH, FLCN, GATA2, GPC3, GREM1 (Promoter region deletion/duplication testing only), HOXB13 (c.251G>A, p.Gly84Glu), HRAS, KIT, MAX, MEN1, MET, MLH1, MSH2, MSH3, MSH6, MUTYH, NBN, NF1, NF2, NTHL1, PALB2, PDGFRA, PHOX2B, PMS2, POLD1, POLE, POT1, PRKAR1A, PTCH1, PTEN, RAD50, RAD51C, RAD51D, RB1, RECQL4, RET, RUNX1, SDHAF2, SDHA (sequence changes only), SDHB, SDHC, SDHD,  SMAD4, SMARCA4, SMARCB1, SMARCE1, STK11, SUFU, TERT, TERT, TMEM127, TP53, TSC1, TSC2, VHL, WRN and WT1.         03/2017 -  Anti-estrogen oral therapy   Letrozole daily   Primary cancer of right upper lobe of lung (HCC)  10/18/2021 Initial Diagnosis   Right upper lobe lung wedge resection: Invasive well-differentiated adenocarcinoma with focal lipidic spread 1.2 cm, margins negative, T1b N0 M0 stage I A2   10/27/2021 Cancer Staging   Staging form: Lung, AJCC 8th Edition - Clinical: Stage IA2 (cT1b, cN0, cM0) - Signed by Serena Croissant, MD on 10/27/2021 Stage prefix: Initial diagnosis Histologic grade (G): G1 Histologic grading system: 4 grade system     CHIEF COMPLIANT: f/u to discuss scans  Discussed the use of AI scribe software for clinical note transcription with the patient, who gave verbal consent to proceed.  History of Present Illness   The patient, with a past medical history of breast cancer and lung cancer, presents for a follow-up visit. . The patient also discusses a recent CT scan which revealed a 4mm pulmonary nodule, a cyst on the liver, and a potential issue with the left femur head. The patient reports feeling numbness across her abdomen periodically, which she suspects may be related to a past surgical procedure. She also mentions experiencing difficulty walking due to pain in the right knee and left hip.      ALLERGIES:  is allergic to codeine, hydrocodone, and latex.  MEDICATIONS:  Current Outpatient Medications  Medication Sig Dispense Refill   aspirin EC 81 MG tablet Take 81 mg by mouth in the morning. Swallow whole.     cholecalciferol (VITAMIN D3) 25 MCG (1000 UNIT) tablet Take 2 tablets (2,000 Units  total) by mouth daily.     Cyanocobalamin (B-12) 1000 MCG TABS Take 1,000 mcg by mouth every other day.     ticagrelor (BRILINTA) 90 MG TABS tablet Take 0.5 tablets (45 mg total) by mouth 2 (two) times daily. 60 tablet 2   No current facility-administered  medications for this visit.    PHYSICAL EXAMINATION: ECOG PERFORMANCE STATUS: 1 - Symptomatic but completely ambulatory  Vitals:   11/01/22 1219  BP: (!) 162/81  Pulse: 68  Resp: 18  Temp: (!) 97.3 F (36.3 C)  SpO2: 100%   Filed Weights   11/01/22 1219  Weight: 211 lb 9.6 oz (96 kg)      LABORATORY DATA:  I have reviewed the data as listed    Latest Ref Rng & Units 09/20/2022    5:38 AM 09/19/2022    6:20 AM 04/14/2022    8:12 AM  CMP  Glucose 70 - 99 mg/dL 99  86  95   BUN 8 - 23 mg/dL 5  15  13    Creatinine 0.44 - 1.00 mg/dL 1.61  0.96  0.45   Sodium 135 - 145 mmol/L 141  143  142   Potassium 3.5 - 5.1 mmol/L 3.5  3.2  3.7   Chloride 98 - 111 mmol/L 113  109  108   CO2 22 - 32 mmol/L 20  21  27    Calcium 8.9 - 10.3 mg/dL 8.1  9.0  9.5   Total Protein 6.5 - 8.1 g/dL  7.1  7.3   Total Bilirubin 0.3 - 1.2 mg/dL  0.7  0.5   Alkaline Phos 38 - 126 U/L  98  95   AST 15 - 41 U/L  16  16   ALT 0 - 44 U/L  15  17     Lab Results  Component Value Date   WBC 7.5 09/20/2022   HGB 11.5 (L) 09/20/2022   HCT 34.6 (L) 09/20/2022   MCV 93.5 09/20/2022   PLT 223 09/20/2022   NEUTROABS 5.2 09/20/2022    ASSESSMENT & PLAN:  Malignant neoplasm of upper-outer quadrant of left breast in female, estrogen receptor positive (HCC) 12/19/16: Left Lumpectomy: Grade 1 IDC, 0.6 cm, Er 100%, PR 100%, Her 2 Neg, Ki 67: 3%, T1bN0 (stage 1A) Adjuvant radiation therapy 01/24/2017-02/20/2017   Treatment plan: Adjuvant antiestrogen therapy with letrozole 2.5 mg daily started 03/03/2017 Letrozole toxicities: Tolerating it extremely well  Breast cancer surveillance:  Mammogram and ultrasound left breast 11/09/2021: Findings suggestive of healing fat necrosis.  Left breast.  Mammogram 02/28/2022: Benign fat necrosis left breast density category B  Primary cancer of right upper lobe of lung (HCC) 09/03/2021: PET CT scan: Posterior right upper lobe perifissural nodule stable without hypermetabolic  activity.  No lymphadenopathy no metastatic disease. 10/18/2021: Right upper lobe lung wedge resection: Invasive well differentiated adenocarcinoma with focal lipidic spread 1.2 cm margins negative  T1b N0 M0 stage I A2   Patient did not want to do lobectomy.  She is now on active surveillance CT chest 01/10/2022: No evidence of local recurrence.  Patchy groundglass micro nodularity suggestive of hypersensitivity pneumonitis CT chest 10/14/2022: Similar changes in the prior right upper lobe wedge resection without evidence of any recurrence.  No evidence of metastatic disease. Unchanged groundglass opacities       Lung Cancer Post-surgical changes noted on CT scan. Stable 4mm pulmonary nodule in the right upper lobe, unchanged in size over the past 6 months. No new nodules or changes  in lung parenchyma. -Continue surveillance with CT chest every 6 months. Next scan due in April 2025.  Liver Cyst Stable cyst noted on liver, unchanged in size. No concern for malignancy. -No intervention needed. Continue surveillance as part of routine imaging.  Vertebral Body Hemangioma Incidental finding on imaging. No associated symptoms. -No intervention needed.  Hip Pain Possible low blood flow to left femur head. Patient reports occasional pain and altered gait. -Refer to orthopedic surgeon for further evaluation.  Abdominal Numbness Patient reports intermittent numbness across abdomen. No clear cause identified on imaging. -Consider referral to neurology if symptoms persist or worsen.  Medication Issue Patient reports being charged for two prescriptions due to pill cutting. -Advise patient to discuss with prescribing physician for possible resolution.  Follow-up Plan to see patient in one year. Patient to follow-up with Dr. Ortencia Kick in six months (April 2025) after next CT scan.          Orders Placed This Encounter  Procedures   CT Chest W Contrast    Standing Status:   Future     Standing Expiration Date:   11/01/2023    Order Specific Question:   If indicated for the ordered procedure, I authorize the administration of contrast media per Radiology protocol    Answer:   Yes    Order Specific Question:   Does the patient have a contrast media/X-ray dye allergy?    Answer:   Yes    Order Specific Question:   Preferred imaging location?    Answer:   [facility]    Order Specific Question:   Release to patient    Answer:   Immediate [1]   The patient has a good understanding of the overall plan. she agrees with it. she will call with any problems that may develop before the next visit here. Total time spent: 30 mins including face to face time and time spent for planning, charting and co-ordination of care   Tamsen Meek, MD 11/01/22

## 2022-11-01 NOTE — Assessment & Plan Note (Signed)
12/19/16: Left Lumpectomy: Grade 1 IDC, 0.6 cm, Er 100%, PR 100%, Her 2 Neg, Ki 67: 3%, T1bN0 (stage 1A) Adjuvant radiation therapy 01/24/2017-02/20/2017   Treatment plan: Adjuvant antiestrogen therapy with letrozole 2.5 mg daily started 03/03/2017 Letrozole toxicities: Tolerating it extremely well  Breast cancer surveillance:  Mammogram and ultrasound left breast 11/09/2021: Findings suggestive of healing fat necrosis.  Left breast.  Mammogram 02/28/2022: Benign fat necrosis left breast density category B

## 2022-11-01 NOTE — Assessment & Plan Note (Signed)
09/03/2021: PET CT scan: Posterior right upper lobe perifissural nodule stable without hypermetabolic activity.  No lymphadenopathy no metastatic disease. 10/18/2021: Right upper lobe lung wedge resection: Invasive well differentiated adenocarcinoma with focal lipidic spread 1.2 cm margins negative  T1b N0 M0 stage I A2   Patient did not want to do lobectomy.  She is now on active surveillance CT chest 01/10/2022: No evidence of local recurrence.  Patchy groundglass micro nodularity suggestive of hypersensitivity pneumonitis CT chest 10/14/2022: Similar changes in the prior right upper lobe wedge resection without evidence of any recurrence.  No evidence of metastatic disease. Unchanged groundglass opacities  Repeat CT scan in 3 months and follow-up after that with a telephone visit

## 2022-11-30 ENCOUNTER — Ambulatory Visit
Admission: RE | Admit: 2022-11-30 | Discharge: 2022-11-30 | Disposition: A | Discharge status: Home / Self Care | Payer: Medicare PPO | Source: Ambulatory Visit | Attending: Family Medicine | Admitting: Family Medicine

## 2022-11-30 DIAGNOSIS — N958 Other specified menopausal and perimenopausal disorders: Secondary | ICD-10-CM | POA: Diagnosis not present

## 2022-11-30 DIAGNOSIS — M8588 Other specified disorders of bone density and structure, other site: Secondary | ICD-10-CM | POA: Diagnosis not present

## 2022-11-30 DIAGNOSIS — E2839 Other primary ovarian failure: Secondary | ICD-10-CM

## 2022-12-22 DIAGNOSIS — L720 Epidermal cyst: Secondary | ICD-10-CM | POA: Diagnosis not present

## 2022-12-22 DIAGNOSIS — D692 Other nonthrombocytopenic purpura: Secondary | ICD-10-CM | POA: Diagnosis not present

## 2022-12-22 DIAGNOSIS — I788 Other diseases of capillaries: Secondary | ICD-10-CM | POA: Diagnosis not present

## 2022-12-22 DIAGNOSIS — L821 Other seborrheic keratosis: Secondary | ICD-10-CM | POA: Diagnosis not present

## 2022-12-22 DIAGNOSIS — D2372 Other benign neoplasm of skin of left lower limb, including hip: Secondary | ICD-10-CM | POA: Diagnosis not present

## 2022-12-22 DIAGNOSIS — D1722 Benign lipomatous neoplasm of skin and subcutaneous tissue of left arm: Secondary | ICD-10-CM | POA: Diagnosis not present

## 2022-12-22 DIAGNOSIS — D225 Melanocytic nevi of trunk: Secondary | ICD-10-CM | POA: Diagnosis not present

## 2022-12-23 ENCOUNTER — Telehealth (HOSPITAL_COMMUNITY): Payer: Self-pay | Admitting: Student

## 2022-12-23 MED ORDER — TICAGRELOR 90 MG PO TABS: 90.0000 mg | ORAL_TABLET | Freq: Two times a day (BID) | ORAL | 5 refills | Status: AC

## 2022-12-23 NOTE — Telephone Encounter (Signed)
Brilinta refill [facility]-pres[facility] to [facility]. 90 mg BID x 30 days with 5 refills.   Patient will still continue to just take 1/2 pill (45 mg) BID.   Alwyn Ren, AGACNP-BC 12/23/2022, 10:42 AM

## 2023-01-12 DIAGNOSIS — M5440 Lumbago with sciatica, unspecified side: Secondary | ICD-10-CM | POA: Diagnosis not present

## 2023-01-16 DIAGNOSIS — Z6837 Body mass index (BMI) 37.0-37.9, adult: Secondary | ICD-10-CM | POA: Diagnosis not present

## 2023-01-16 DIAGNOSIS — Z01419 Encounter for gynecological examination (general) (routine) without abnormal findings: Secondary | ICD-10-CM | POA: Diagnosis not present

## 2023-01-20 ENCOUNTER — Other Ambulatory Visit: Payer: Self-pay | Admitting: Obstetrics and Gynecology

## 2023-01-20 DIAGNOSIS — Z1231 Encounter for screening mammogram for malignant neoplasm of breast: Secondary | ICD-10-CM

## 2023-01-24 ENCOUNTER — Telehealth (HOSPITAL_COMMUNITY): Payer: Self-pay

## 2023-01-24 NOTE — Telephone Encounter (Signed)
Returned call to pt, no answer, left vm. AB

## 2023-01-25 ENCOUNTER — Other Ambulatory Visit (HOSPITAL_COMMUNITY): Payer: Self-pay | Admitting: Interventional Radiology

## 2023-01-25 ENCOUNTER — Telehealth (HOSPITAL_COMMUNITY): Payer: Self-pay | Admitting: Student

## 2023-01-25 DIAGNOSIS — I671 Cerebral aneurysm, nonruptured: Secondary | ICD-10-CM

## 2023-01-25 MED ORDER — TICAGRELOR 90 MG PO TABS: 90.0000 mg | ORAL_TABLET | Freq: Two times a day (BID) | ORAL | 0 refills | Status: AC

## 2023-01-25 NOTE — Telephone Encounter (Signed)
Patient requested a two week refill of Brilinta due to almost being out of the drug. This drug may be discontinued at the end of the month pending next MRI.   Medication e-prescribed as requested.  Alwyn Ren, AGACNP-BC 01/25/2023, 4:21 PM

## 2023-02-02 ENCOUNTER — Telehealth: Payer: Self-pay | Admitting: Emergency Medicine

## 2023-02-02 ENCOUNTER — Ambulatory Visit (HOSPITAL_COMMUNITY)
Admission: RE | Admit: 2023-02-02 | Discharge: 2023-02-02 | Disposition: A | Discharge status: Home / Self Care | Payer: Medicare PPO | Source: Ambulatory Visit | Attending: Interventional Radiology | Admitting: Interventional Radiology

## 2023-02-02 DIAGNOSIS — J3489 Other specified disorders of nose and nasal sinuses: Secondary | ICD-10-CM | POA: Diagnosis not present

## 2023-02-02 DIAGNOSIS — I6782 Cerebral ischemia: Secondary | ICD-10-CM | POA: Diagnosis not present

## 2023-02-02 DIAGNOSIS — I671 Cerebral aneurysm, nonruptured: Secondary | ICD-10-CM | POA: Insufficient documentation

## 2023-02-02 MED ORDER — PREDNISONE 10 MG PO TABS: ORAL_TABLET | ORAL | 0 refills | Status: DC

## 2023-02-02 NOTE — Telephone Encounter (Signed)
Please have her try OTC cold medication/decongestant like Tylenol Cold and flu, at least temporarily for symptom control Would encourage her to try using the albuterol as needed Offer her a prednisone taper if she would like to try taking it: Take 40mg  daily for 3 days, then 30mg  daily for 3 days, then 20mg  daily for 3 days, then 10mg  daily for 3 days, then stop If she is not improving over the next 2 weeks I think we need to get her in for an acute visit

## 2023-02-02 NOTE — Telephone Encounter (Signed)
Patient is having congestion and a dry cough. She is breathing/wheezing a little more than usual. She would like suggestions on what to do. She can be reached at [phone] a detailed voice message can be left. She has an appointment at 11:45am today if she does not answer.

## 2023-02-02 NOTE — Telephone Encounter (Signed)
I called and spoke with the pt and notified of response from Dr Chauncy Passy  She verbalized understanding  Rx sent to pharm  She will call if not improving

## 2023-02-02 NOTE — Telephone Encounter (Signed)
I called and spoke with the pt  She c/o increased SOB, wheezing, dry cough, rhinitis- onset was 3 days ago  She has a slight HA  Denies any fevers aches She has albuterol inhaler but does not use it  She has appt with RB scheduled for 04/26/23  No appts open this wk  Please advise, thanks!  Allergies  Allergen Reactions   Codeine Nausea And Vomiting   Hydrocodone Nausea And Vomiting    Also lightheaded and disoriented   Latex Rash

## 2023-02-04 ENCOUNTER — Telehealth: Payer: Self-pay | Admitting: Internal Medicine

## 2023-02-04 DIAGNOSIS — J209 Acute bronchitis, unspecified: Secondary | ICD-10-CM

## 2023-02-04 MED ORDER — AZITHROMYCIN 250 MG PO TABS: ORAL_TABLET | ORAL | 0 refills | Status: DC

## 2023-02-04 NOTE — Telephone Encounter (Signed)
Caling sayin gnot much better with d3. Still coughing. No sputum. No fever.  [facility]

## 2023-02-04 NOTE — Telephone Encounter (Signed)
E script did not go through. Changed from ccm environment to pulm environment and sent but stil ldid not get it. So I called it in      SIGNATURE    Dr. Kalman Shan, M.D., F.C.C.P,  Pulmonary and Critical Care Medicine Staff Physician, [facility] Director - Interstitial Lung Disease  Program  [facility] at [facility] [place], [place], [address]   Pager: [phone], If no answer  -> Check AMION or Try [phone] Telephone (clinical office): [phone] Telephone (research): [phone]  1:21 PM 02/04/2023

## 2023-02-06 ENCOUNTER — Telehealth: Payer: Self-pay | Admitting: Internal Medicine

## 2023-02-06 NOTE — Telephone Encounter (Signed)
Spoke with the pt  She states that she called over the weekend and spoke with MR- has started on zithromax and is feeling better  Nothing further needed

## 2023-02-06 NOTE — Telephone Encounter (Signed)
Pls see signed encounter. PT now calling answering service asking: How is bronchitis treated w/pred or an antibx? Just finished a round and throat still hurts.   Please call to advise. [phone]

## 2023-02-07 DIAGNOSIS — M5440 Lumbago with sciatica, unspecified side: Secondary | ICD-10-CM | POA: Diagnosis not present

## 2023-02-07 DIAGNOSIS — M47812 Spondylosis without myelopathy or radiculopathy, cervical region: Secondary | ICD-10-CM | POA: Diagnosis not present

## 2023-02-07 DIAGNOSIS — M5382 Other specified dorsopathies, cervical region: Secondary | ICD-10-CM | POA: Diagnosis not present

## 2023-02-07 DIAGNOSIS — M5137 Other intervertebral disc degeneration, lumbosacral region with discogenic back pain only: Secondary | ICD-10-CM | POA: Diagnosis not present

## 2023-02-07 DIAGNOSIS — M4802 Spinal stenosis, cervical region: Secondary | ICD-10-CM | POA: Diagnosis not present

## 2023-02-07 DIAGNOSIS — M25552 Pain in left hip: Secondary | ICD-10-CM | POA: Diagnosis not present

## 2023-02-07 DIAGNOSIS — M5021 Other cervical disc displacement,  high cervical region: Secondary | ICD-10-CM | POA: Diagnosis not present

## 2023-02-07 DIAGNOSIS — G96191 Perineural cyst: Secondary | ICD-10-CM | POA: Diagnosis not present

## 2023-02-07 DIAGNOSIS — M4187 Other forms of scoliosis, lumbosacral region: Secondary | ICD-10-CM | POA: Diagnosis not present

## 2023-02-16 ENCOUNTER — Telehealth (HOSPITAL_COMMUNITY): Payer: Self-pay

## 2023-02-16 NOTE — Telephone Encounter (Signed)
Pt agreed to f/u in 6 months with a mri/mra. Will ask Dr. Corliss Skains if he wants mri wo or w/wo contrast. AB

## 2023-02-16 NOTE — Telephone Encounter (Signed)
Hoyt Koch, PA  Renaldo Reel, Lise Auer Apologies, reviewed again with Dr. Shan Levans and she may discontinue Brilinta.  Continue aspirin 81mg  daily.

## 2023-03-02 ENCOUNTER — Ambulatory Visit
Admission: RE | Admit: 2023-03-02 | Discharge: 2023-03-02 | Disposition: A | Discharge status: Home / Self Care | Payer: Medicare PPO | Source: Ambulatory Visit | Attending: Obstetrics and Gynecology | Admitting: Obstetrics and Gynecology

## 2023-03-02 DIAGNOSIS — Z1231 Encounter for screening mammogram for malignant neoplasm of breast: Secondary | ICD-10-CM | POA: Diagnosis not present

## 2023-03-14 DIAGNOSIS — M5440 Lumbago with sciatica, unspecified side: Secondary | ICD-10-CM | POA: Diagnosis not present

## 2023-03-14 DIAGNOSIS — M542 Cervicalgia: Secondary | ICD-10-CM | POA: Diagnosis not present

## 2023-04-07 ENCOUNTER — Ambulatory Visit (HOSPITAL_COMMUNITY)
Admission: RE | Admit: 2023-04-07 | Discharge: 2023-04-07 | Disposition: A | Discharge status: Home / Self Care | Source: Ambulatory Visit | Attending: Hematology and Oncology | Admitting: Hematology and Oncology

## 2023-04-07 DIAGNOSIS — C3490 Malignant neoplasm of unspecified part of unspecified bronchus or lung: Secondary | ICD-10-CM | POA: Insufficient documentation

## 2023-04-07 DIAGNOSIS — C3411 Malignant neoplasm of upper lobe, right bronchus or lung: Secondary | ICD-10-CM | POA: Diagnosis not present

## 2023-04-07 DIAGNOSIS — C50912 Malignant neoplasm of unspecified site of left female breast: Secondary | ICD-10-CM | POA: Diagnosis not present

## 2023-04-07 DIAGNOSIS — I700 Atherosclerosis of aorta: Secondary | ICD-10-CM | POA: Diagnosis not present

## 2023-04-07 MED ORDER — IOHEXOL 300 MG/ML  SOLN
80.0000 mL | Freq: Once | INTRAMUSCULAR | Status: AC | PRN
  Administered 2023-04-07: 80 mL via INTRAVENOUS

## 2023-04-07 MED ORDER — SODIUM CHLORIDE (PF) 0.9 % IJ SOLN
INTRAMUSCULAR | Status: AC
  Filled 2023-04-07: qty 50

## 2023-04-24 ENCOUNTER — Telehealth: Payer: Self-pay

## 2023-04-24 NOTE — Telephone Encounter (Signed)
 Pt called with concern as her CT chest has not been read yet and she is scheduled to see Dr Danelle Dunning 4/23. Advised pt we placed a call to the [facility] reading room to request it be read. She verbalized thanks.

## 2023-04-25 ENCOUNTER — Telehealth: Payer: Self-pay | Admitting: Hematology and Oncology

## 2023-04-25 NOTE — Telephone Encounter (Signed)
 Discussed the CT scan report in detail.

## 2023-04-26 ENCOUNTER — Ambulatory Visit: Payer: Medicare PPO | Admitting: Emergency Medicine

## 2023-04-26 ENCOUNTER — Encounter: Payer: Self-pay | Admitting: Emergency Medicine

## 2023-04-26 VITALS — BP 138/76 | HR 75 | Ht 63.0 in | Wt 214.8 lb

## 2023-04-26 DIAGNOSIS — C3411 Malignant neoplasm of upper lobe, right bronchus or lung: Secondary | ICD-10-CM | POA: Diagnosis not present

## 2023-04-26 DIAGNOSIS — J219 Acute bronchiolitis, unspecified: Secondary | ICD-10-CM | POA: Insufficient documentation

## 2023-04-26 NOTE — Assessment & Plan Note (Signed)
 Evidence was a very subtle upper lobe predominant inflammatory change on her CT chest, unclear etiology and no real symptoms that correlate.  I do not think we need to pursue any intervention right now other than to just follow on her repeat CT chest that will be done on October.  If progressive, if she does develop symptoms then we could consider further evaluation including even possible bronchoscopy with BAL for culture data.

## 2023-04-26 NOTE — Assessment & Plan Note (Signed)
 CT chest stable.  Reassuring.  No evidence of recurrence.  No lymphadenopathy.  She is planning to have a repeat CT chest with Dr. Lee Public in October.  If any interval change then I will see her back and we can talk about diagnostics.

## 2023-04-26 NOTE — Patient Instructions (Signed)
 We reviewed your CT scan of the chest today. Get a repeat CT chest and October 2025 as planned by Dr. Lee Public and follow-up with him to review. Follow Dr. Baldwin Levee in 1 year, sooner if you have any problems.

## 2023-04-26 NOTE — Progress Notes (Signed)
 Subjective:    Patient ID: Virginia Lindsey, female    DOB: February 12, 1950, 73 y.o.   MRN: [id number]   HPI 73 year old never smoker with a history of breast cancer.  I met her and evaluation of a slowly growing irregularly shaped right upper lobe pulmonary nodule that was increasing in size on serial chest imaging.  Also some mild mediastinal adenopathy.  There was no PET hypermetabolism in the nodule or in the mediastinum on scan performed 09/03/2021.  She underwent navigational bronchoscopy with dye marking and then wedge resection by Dr. Deloise Ferries on 10/18/2021.  Surgical pathology showed an invasive well-differentiated adenocarcinoma with lipidic spread, clear margins.  She is being considered for possible completion right upper lobectomy. She is also planning to follow with Dr Lee Public.    ROV 01/18/2022 --73 year old woman with a history of breast cancer whom I have seen for right upper lobe pulmonary nodule.  She underwent commendation dye marking and wedge resection 10/18/2021 and this showed an invasive well-differentiated adenocarcinoma with some lipidic spread with clear margins.  Some consideration regarding a completion segmentectomy or lobectomy.  This has not been done to thus far.  She had some scant mediastinal adenopathy that was not hypermetabolic.  We decided to repeat her CT scan of the chest to look for interval change and guide decision-making regarding whether she needs EBUS. She has been well. No cough. Mobility limited by knee pain  CT chest 01/10/2022 reviewed by me, shows no pathologically enlarged axillary mediastinal or hilar nodes, postsurgical scarring along the posterior right upper lobe wedge resection suture line without any suspicious findings for recurrence.  No evidence of distant disease.  There is some stable chronic faint centrilobular groundglass micronodularity bilaterally  ROV 04/26/23 --73 year old woman with history of breast cancer and right upper lobe  adenocarcinoma that was treated by wedge resection 10/2021.  She was considered for possible completion segmentectomy or lobectomy but this has not been done.  There was some scant mediastinal adenopathy.  I have been following her with serial imaging.  We repeated her scan 4/4 She was sick with an upper respiratory infection in late January, reports that she is improved, back to baseline. Decent functional capacity, limited by hip pain. No exertional SOB.   CT scan of the chest/ 04/07/23 reviewed by me, shows changes from her right upper lobe wedge resection without any evidence of recurrence or metastatic disease.  Some stable upper lobe predominant subtle reticulonodular opacities suggestive of possible underlying inflammation or infection.  No mediastinal or hilar adenopathy.  Review of Systems As per HPI  Past Medical History:  Diagnosis Date   Arthritis    Back pain    Breast cancer (HCC) 2018   left   COVID 2020   mild   Dyspnea    hx with walking a distance   Family history of breast cancer    Family history of melanoma    Family history of renal cancer    Lung cancer (HCC) 10/2021   wedge resection   Palpitations    able to get them under control by deep breathing   Personal history of radiation therapy    PONV (postoperative nausea and vomiting)    Racing heart beat    per pt=going on for years, PCP aware per pt.   Seizures (HCC)    last seizure 2004  3 total 10 yrs apart   Sleep apnea    Dx 20-30 years ago, does not use CPAP-sleeps with head elevated.  Umbilical hernia    Wears glasses      Family History  Problem Relation Age of Onset   Asthma Mother    Cancer Mother        renal cell carcinoma, dx in her 35s, d. 45   Cancer Father 58       melanoma on face; d. 23   Diabetes Maternal Grandmother    Breast cancer Sister 31       d. 63   Breast cancer Paternal Aunt 33       dbl mastectomy   Cancer Paternal Uncle        NOS   Melanoma Paternal Aunt     Breast cancer Paternal Aunt    Melanoma Cousin        pat first cousin; daughter of aunt with melanoma   Breast cancer Cousin    Lymphoma Son 4       [place] Lymphoma     Social History   Socioeconomic History   Marital status: Married    Spouse name: Not on file   Number of children: 4   Years of education: Not on file   Highest education level: Not on file  Occupational History   Not on file  Tobacco Use   Smoking status: Never   Smokeless tobacco: Never  Vaping Use   Vaping status: Never Used  Substance and Sexual Activity   Alcohol use: Never   Drug use: Never   Sexual activity: Not Currently    Birth control/protection: Surgical    Comment: Hysterectomy  Other Topics Concern   Not on file  Social History Narrative   Not on file   Social Drivers of Health   Financial Resource Strain: Not on file  Food Insecurity: Not on file  Transportation Needs: Not on file  Physical Activity: Not on file  Stress: Not on file  Social Connections: Not on file  Intimate Partner Violence: Not on file     Allergies  Allergen Reactions   Codeine Nausea And Vomiting   Hydrocodone Nausea And Vomiting    Also lightheaded and disoriented   Latex Rash     Outpatient Medications Prior to Visit  Medication Sig Dispense Refill   aspirin  EC 81 MG tablet Take 81 mg by mouth in the morning. Swallow whole.     cholecalciferol (VITAMIN D3) 25 MCG (1000 UNIT) tablet Take 2 tablets (2,000 Units total) by mouth daily.     Cyanocobalamin  (B-12) 1000 MCG TABS Take 1,000 mcg by mouth every other day.     azithromycin  (ZITHROMAX ) 250 MG tablet Azithromycin  500mg  on day 1 and the 250mg  daily x next 4 days 6 tablet 0   predniSONE  (DELTASONE ) 10 MG tablet 4 x 3 days, 3 x3 days, 2 x 3 days, 1 x 3 days then stop 30 tablet 0   ticagrelor  (BRILINTA ) 90 MG TABS tablet Take 0.5 tablets (45 mg total) by mouth 2 (two) times daily. (Patient not taking: Reported on 04/26/2023) 60 tablet 2   ticagrelor   (BRILINTA ) 90 MG TABS tablet Take 1 tablet (90 mg total) by mouth 2 (two) times daily. 60 tablet 5   No facility-administered medications prior to visit.        Objective:   Physical Exam Vitals:   04/26/23 0918  BP: 138/76  Pulse: 75  SpO2: 98%  Weight: 214 lb 12.8 oz (97.4 kg)  Height: 5\' 3"  (1.6 m)   Gen: Pleasant, well-nourished, in no distress,  normal affect  ENT: No lesions,  mouth clear,  oropharynx clear, no postnasal drip  Neck: No JVD, no stridor  Lungs: No use of accessory muscles, no crackles or wheezing on normal respiration, no wheeze on forced expiration  Cardiovascular: RRR, heart sounds normal, no murmur or gallops, no peripheral edema  Musculoskeletal: No deformities, no cyanosis or clubbing  Neuro: alert, awake, non focal  Skin: Warm, no lesions or rash       Assessment & Plan:  Bronchiolitis Evidence was a very subtle upper lobe predominant inflammatory change on her CT chest, unclear etiology and no real symptoms that correlate.  I do not think we need to pursue any intervention right now other than to just follow on her repeat CT chest that will be done on October.  If progressive, if she does develop symptoms then we could consider further evaluation including even possible bronchoscopy with BAL for culture data.  Primary cancer of right upper lobe of lung (HCC) CT chest stable.  Reassuring.  No evidence of recurrence.  No lymphadenopathy.  She is planning to have a repeat CT chest with Dr. Lee Public in October.  If any interval change then I will see her back and we can talk about diagnostics.    Racheal Buddle, MD, PhD 04/26/2023, 9:57 AM [facility] [phone] or if no answer before 7:00PM call [phone] For any issues after 7:00PM please call eLink [phone]

## 2023-06-13 DIAGNOSIS — M5440 Lumbago with sciatica, unspecified side: Secondary | ICD-10-CM | POA: Diagnosis not present

## 2023-06-13 DIAGNOSIS — G629 Polyneuropathy, unspecified: Secondary | ICD-10-CM | POA: Diagnosis not present

## 2023-06-14 ENCOUNTER — Other Ambulatory Visit: Payer: Self-pay

## 2023-06-14 ENCOUNTER — Encounter: Payer: Self-pay | Admitting: Neurology

## 2023-06-14 DIAGNOSIS — R202 Paresthesia of skin: Secondary | ICD-10-CM

## 2023-06-14 NOTE — Addendum Note (Signed)
 Addended by: Rox Cope A on: 06/14/2023 08:45 AM   Modules accepted: Orders

## 2023-06-29 DIAGNOSIS — H52223 Regular astigmatism, bilateral: Secondary | ICD-10-CM | POA: Diagnosis not present

## 2023-06-29 DIAGNOSIS — G43109 Migraine with aura, not intractable, without status migrainosus: Secondary | ICD-10-CM | POA: Diagnosis not present

## 2023-06-29 DIAGNOSIS — H16223 Keratoconjunctivitis sicca, not specified as Sjogren's, bilateral: Secondary | ICD-10-CM | POA: Diagnosis not present

## 2023-06-29 DIAGNOSIS — H25043 Posterior subcapsular polar age-related cataract, bilateral: Secondary | ICD-10-CM | POA: Diagnosis not present

## 2023-06-29 DIAGNOSIS — H5213 Myopia, bilateral: Secondary | ICD-10-CM | POA: Diagnosis not present

## 2023-06-29 DIAGNOSIS — H2513 Age-related nuclear cataract, bilateral: Secondary | ICD-10-CM | POA: Diagnosis not present

## 2023-07-01 ENCOUNTER — Encounter (HOSPITAL_COMMUNITY): Payer: Self-pay | Admitting: Interventional Radiology

## 2023-07-05 ENCOUNTER — Other Ambulatory Visit (HOSPITAL_COMMUNITY): Payer: Self-pay | Admitting: Interventional Radiology

## 2023-07-05 DIAGNOSIS — I671 Cerebral aneurysm, nonruptured: Secondary | ICD-10-CM

## 2023-07-14 ENCOUNTER — Ambulatory Visit (HOSPITAL_COMMUNITY)
Admission: RE | Admit: 2023-07-14 | Discharge: 2023-07-14 | Disposition: A | Discharge status: Home / Self Care | Source: Ambulatory Visit | Attending: Interventional Radiology | Admitting: Interventional Radiology

## 2023-07-14 DIAGNOSIS — I671 Cerebral aneurysm, nonruptured: Secondary | ICD-10-CM | POA: Diagnosis not present

## 2023-07-18 ENCOUNTER — Ambulatory Visit: Admitting: Neurology

## 2023-07-18 DIAGNOSIS — R202 Paresthesia of skin: Secondary | ICD-10-CM

## 2023-07-18 NOTE — Procedures (Signed)
 [facility]  [address] [place], [address]  [place], [place] [address] Tel: [phone] Fax: [phone] Test Date:  07/18/2023  Patient: Virginia Lindsey DOB: [birth year] Physician: Venetia Potters, MD  Sex: Female Height: 5' 3 Ref Phys: Arley Helling, MD  ID#: [id number]   Technician:    History: This is a 73 year old female with pain in back and legs.  NCV & EMG Findings: Extensive electrodiagnostic evaluation of bilateral lower limbs shows: Bilateral sural and superficial peroneal/fibular sensory responses are within normal limits. Bilateral peroneal/fibular (EDB) and tibial (AH) motor responses are within normal limits. Bilateral H reflex latency is within normal limits. There is no evidence of active or chronic motor axon loss changes affecting any of the tested muscles. Motor unit configuration and recruitment pattern is within normal limits.  Impression: This is a normal study of bilateral lower limbs. Specifically: No electrodiagnostic evidence of a large fiber sensorimotor neuropathy. No definitive electrodiagnostic evidence of a right or left lumbosacral (L3-S1) radiculopathy.    ___________________________ Venetia Potters, MD    Nerve Conduction Studies Motor Nerve Results    Latency Amplitude F-Lat Segment Distance CV Comment  Site (ms) Norm (mV) Norm (ms)  (cm) (m/s) Norm   Left Fibular (EDB) Motor  Ankle 3.2  < 6.0 3.8  > 2.5        Bel fib head 9.4 - 3.6 -  Bel fib head-Ankle 28.5 46  > 40   Pop fossa 11.1 - 3.6 -  Pop fossa-Bel fib head 8.5 50 -   Right Fibular (EDB) Motor  Ankle 3.5  < 6.0 2.7  > 2.5        Bel fib head 10.1 - 2.3 -  Bel fib head-Ankle 29 44  > 40   Pop fossa 11.7 - 2.3 -  Pop fossa-Bel fib head 8 50 -   Left Tibial (AH) Motor  Ankle 3.5  < 6.0 8.9  > 4.0        Knee 11.3 - 7.9 -  Knee-Ankle 36 46  > 40   Right Tibial (AH) Motor  Ankle 3.2  < 6.0 8.2  > 4.0        Knee 10.8 - 6.3 -  Knee-Ankle 36 47  > 40    Sensory Sites    Neg  Peak Lat Amplitude (O-P) Segment Distance Velocity Comment  Site (ms) Norm (V) Norm  (cm) (ms)   Left Superficial Fibular Sensory  14 cm-Ankle 2.6  < 4.6 6  > 3 14 cm-Ankle 14    Right Superficial Fibular Sensory  14 cm-Ankle 2.8  < 4.6 6  > 3 14 cm-Ankle 14    Left Sural Sensory  Calf-Lat mall 3.8  < 4.6 9  > 3 Calf-Lat mall 14    Right Sural Sensory  Calf-Lat mall 3.2  < 4.6 12  > 3 Calf-Lat mall 14     H-Reflex Results    M-Lat H Lat H Neg Amp H-M Lat  Site (ms) (ms) Norm (mV) (ms)  Left Tibial H-Reflex  Pop fossa 5.5 33.2  < 35.0 1.18 27.7  Right Tibial H-Reflex  Pop fossa 5.9 33.5  < 35.0 0.90 27.6   Electromyography   Side Muscle Ins.Act Fibs Fasc Recrt Amp Dur Poly Activation Comment  Left Tib ant Nml Nml Nml Nml Nml Nml Nml Nml N/A  Left Gastroc MH Nml Nml Nml Nml Nml Nml Nml Nml N/A  Left Vastus lat Nml Nml Nml Nml Nml  Nml Nml Nml N/A  Left Biceps fem SH Nml Nml Nml Nml Nml Nml Nml Nml N/A  Left Gluteus med Nml Nml Nml Nml Nml Nml Nml Nml N/A  Right Tib ant Nml Nml Nml Nml Nml Nml Nml Nml N/A  Right Gastroc MH Nml Nml Nml Nml Nml Nml Nml Nml N/A  Right Vastus lat Nml Nml Nml Nml Nml Nml Nml *2- N/A  Right Biceps fem SH Nml Nml Nml Nml Nml Nml Nml Nml N/A  Right Gluteus med Nml Nml Nml Nml Nml Nml Nml Nml N/A      Waveforms:  Motor           Sensory           H-Reflex

## 2023-07-24 ENCOUNTER — Ambulatory Visit (HOSPITAL_COMMUNITY)
Admission: RE | Admit: 2023-07-24 | Discharge: 2023-07-24 | Disposition: A | Discharge status: Home / Self Care | Source: Ambulatory Visit | Attending: Interventional Radiology | Admitting: Interventional Radiology

## 2023-07-24 DIAGNOSIS — I671 Cerebral aneurysm, nonruptured: Secondary | ICD-10-CM

## 2023-07-27 DIAGNOSIS — M5440 Lumbago with sciatica, unspecified side: Secondary | ICD-10-CM | POA: Diagnosis not present

## 2023-07-27 DIAGNOSIS — Z6837 Body mass index (BMI) 37.0-37.9, adult: Secondary | ICD-10-CM | POA: Diagnosis not present

## 2023-07-27 DIAGNOSIS — G629 Polyneuropathy, unspecified: Secondary | ICD-10-CM | POA: Diagnosis not present

## 2023-07-28 DIAGNOSIS — M545 Low back pain, unspecified: Secondary | ICD-10-CM | POA: Diagnosis not present

## 2023-07-28 DIAGNOSIS — L0292 Furuncle, unspecified: Secondary | ICD-10-CM | POA: Diagnosis not present

## 2023-07-28 DIAGNOSIS — R399 Unspecified symptoms and signs involving the genitourinary system: Secondary | ICD-10-CM | POA: Diagnosis not present

## 2023-07-28 DIAGNOSIS — M179 Osteoarthritis of knee, unspecified: Secondary | ICD-10-CM | POA: Diagnosis not present

## 2023-07-28 DIAGNOSIS — Z6836 Body mass index (BMI) 36.0-36.9, adult: Secondary | ICD-10-CM | POA: Diagnosis not present

## 2023-08-11 DIAGNOSIS — H2513 Age-related nuclear cataract, bilateral: Secondary | ICD-10-CM | POA: Diagnosis not present

## 2023-08-11 DIAGNOSIS — H25013 Cortical age-related cataract, bilateral: Secondary | ICD-10-CM | POA: Diagnosis not present

## 2023-08-11 DIAGNOSIS — H2512 Age-related nuclear cataract, left eye: Secondary | ICD-10-CM | POA: Diagnosis not present

## 2023-08-11 DIAGNOSIS — H25043 Posterior subcapsular polar age-related cataract, bilateral: Secondary | ICD-10-CM | POA: Diagnosis not present

## 2023-08-15 DIAGNOSIS — Z0000 Encounter for general adult medical examination without abnormal findings: Secondary | ICD-10-CM | POA: Diagnosis not present

## 2023-08-15 DIAGNOSIS — E7800 Pure hypercholesterolemia, unspecified: Secondary | ICD-10-CM | POA: Diagnosis not present

## 2023-08-15 DIAGNOSIS — Z87440 Personal history of urinary (tract) infections: Secondary | ICD-10-CM | POA: Diagnosis not present

## 2023-08-15 DIAGNOSIS — E559 Vitamin D deficiency, unspecified: Secondary | ICD-10-CM | POA: Diagnosis not present

## 2023-08-15 DIAGNOSIS — Z1331 Encounter for screening for depression: Secondary | ICD-10-CM | POA: Diagnosis not present

## 2023-08-15 DIAGNOSIS — L0292 Furuncle, unspecified: Secondary | ICD-10-CM | POA: Diagnosis not present

## 2023-08-15 DIAGNOSIS — R826 Abnormal urine levels of substances chiefly nonmedicinal as to source: Secondary | ICD-10-CM | POA: Diagnosis not present

## 2023-08-15 DIAGNOSIS — Z09 Encounter for follow-up examination after completed treatment for conditions other than malignant neoplasm: Secondary | ICD-10-CM | POA: Diagnosis not present

## 2023-08-15 DIAGNOSIS — E538 Deficiency of other specified B group vitamins: Secondary | ICD-10-CM | POA: Diagnosis not present

## 2023-08-15 DIAGNOSIS — M545 Low back pain, unspecified: Secondary | ICD-10-CM | POA: Diagnosis not present

## 2023-09-20 DIAGNOSIS — Z09 Encounter for follow-up examination after completed treatment for conditions other than malignant neoplasm: Secondary | ICD-10-CM | POA: Diagnosis not present

## 2023-09-20 DIAGNOSIS — Z860100 Personal history of colon polyps, unspecified: Secondary | ICD-10-CM | POA: Diagnosis not present

## 2023-10-04 DIAGNOSIS — Z860101 Personal history of adenomatous and serrated colon polyps: Secondary | ICD-10-CM | POA: Diagnosis not present

## 2023-10-04 DIAGNOSIS — D124 Benign neoplasm of descending colon: Secondary | ICD-10-CM | POA: Diagnosis not present

## 2023-10-04 DIAGNOSIS — D123 Benign neoplasm of transverse colon: Secondary | ICD-10-CM | POA: Diagnosis not present

## 2023-10-04 DIAGNOSIS — K640 First degree hemorrhoids: Secondary | ICD-10-CM | POA: Diagnosis not present

## 2023-10-04 DIAGNOSIS — K5730 Diverticulosis of large intestine without perforation or abscess without bleeding: Secondary | ICD-10-CM | POA: Diagnosis not present

## 2023-10-04 DIAGNOSIS — Z09 Encounter for follow-up examination after completed treatment for conditions other than malignant neoplasm: Secondary | ICD-10-CM | POA: Diagnosis not present

## 2023-10-06 DIAGNOSIS — D123 Benign neoplasm of transverse colon: Secondary | ICD-10-CM | POA: Diagnosis not present

## 2023-10-06 DIAGNOSIS — D124 Benign neoplasm of descending colon: Secondary | ICD-10-CM | POA: Diagnosis not present

## 2023-10-10 ENCOUNTER — Telehealth: Payer: Self-pay | Admitting: *Deleted

## 2023-10-10 ENCOUNTER — Other Ambulatory Visit: Payer: Self-pay | Admitting: Hematology and Oncology

## 2023-10-10 ENCOUNTER — Other Ambulatory Visit: Payer: Self-pay | Admitting: *Deleted

## 2023-10-10 DIAGNOSIS — C3490 Malignant neoplasm of unspecified part of unspecified bronchus or lung: Secondary | ICD-10-CM

## 2023-10-10 NOTE — Telephone Encounter (Signed)
 Ms. Vanderhoef contacted office to ask if CT needed before her appt with Dr. Odean October 30.  She has cataract surgery scheduled for 10/23/23 and wants to have CT done prior to that if possible. Per Dr. Gudena, CT needed and order entered.  Ms. Whan informed CT needed. She stated she would call [facility] Centralized Radiology Scheduling to get appt.

## 2023-10-23 DIAGNOSIS — H2512 Age-related nuclear cataract, left eye: Secondary | ICD-10-CM | POA: Diagnosis not present

## 2023-10-24 DIAGNOSIS — Z961 Presence of intraocular lens: Secondary | ICD-10-CM | POA: Diagnosis not present

## 2023-10-24 DIAGNOSIS — H2511 Age-related nuclear cataract, right eye: Secondary | ICD-10-CM | POA: Diagnosis not present

## 2023-10-27 ENCOUNTER — Ambulatory Visit (HOSPITAL_COMMUNITY)
Admission: RE | Admit: 2023-10-27 | Discharge: 2023-10-27 | Disposition: A | Discharge status: Home / Self Care | Source: Ambulatory Visit | Attending: Hematology and Oncology | Admitting: Hematology and Oncology

## 2023-10-27 ENCOUNTER — Inpatient Hospital Stay: Attending: Hematology and Oncology

## 2023-10-27 DIAGNOSIS — Z85118 Personal history of other malignant neoplasm of bronchus and lung: Secondary | ICD-10-CM | POA: Insufficient documentation

## 2023-10-27 DIAGNOSIS — I700 Atherosclerosis of aorta: Secondary | ICD-10-CM | POA: Diagnosis not present

## 2023-10-27 DIAGNOSIS — I2510 Atherosclerotic heart disease of native coronary artery without angina pectoris: Secondary | ICD-10-CM | POA: Diagnosis not present

## 2023-10-27 DIAGNOSIS — C3490 Malignant neoplasm of unspecified part of unspecified bronchus or lung: Secondary | ICD-10-CM | POA: Insufficient documentation

## 2023-10-27 DIAGNOSIS — Z853 Personal history of malignant neoplasm of breast: Secondary | ICD-10-CM | POA: Insufficient documentation

## 2023-10-27 LAB — CBC WITH DIFFERENTIAL (CANCER CENTER ONLY)
Abs Immature Granulocytes: 0.02 K/uL (ref 0.00–0.07)
Basophils Absolute: 0 K/uL (ref 0.0–0.1)
Basophils Relative: 1 %
Eosinophils Absolute: 0.1 K/uL (ref 0.0–0.5)
Eosinophils Relative: 2 %
HCT: 41.1 % (ref 36.0–46.0)
Hemoglobin: 14.3 g/dL (ref 12.0–15.0)
Immature Granulocytes: 0 %
Lymphocytes Relative: 31 %
Lymphs Abs: 1.9 K/uL (ref 0.7–4.0)
MCH: 32.1 pg (ref 26.0–34.0)
MCHC: 34.8 g/dL (ref 30.0–36.0)
MCV: 92.2 fL (ref 80.0–100.0)
Monocytes Absolute: 0.6 K/uL (ref 0.1–1.0)
Monocytes Relative: 10 %
Neutro Abs: 3.3 K/uL (ref 1.7–7.7)
Neutrophils Relative %: 56 %
Platelet Count: 259 K/uL (ref 150–400)
RBC: 4.46 MIL/uL (ref 3.87–5.11)
RDW: 12.4 % (ref 11.5–15.5)
WBC Count: 6 K/uL (ref 4.0–10.5)
nRBC: 0 % (ref 0.0–0.2)

## 2023-10-27 LAB — CMP (CANCER CENTER ONLY)
ALT: 9 U/L (ref 0–44)
AST: 14 U/L — ABNORMAL LOW (ref 15–41)
Albumin: 4 g/dL (ref 3.5–5.0)
Alkaline Phosphatase: 86 U/L (ref 38–126)
Anion gap: 7 (ref 5–15)
BUN: 17 mg/dL (ref 8–23)
CO2: 26 mmol/L (ref 22–32)
Calcium: 9 mg/dL (ref 8.9–10.3)
Chloride: 109 mmol/L (ref 98–111)
Creatinine: 0.93 mg/dL (ref 0.44–1.00)
GFR, Estimated: >60 mL/min (ref >60)
Glucose, Bld: 82 mg/dL (ref 70–99)
Potassium: 3.7 mmol/L (ref 3.5–5.1)
Sodium: 142 mmol/L (ref 135–145)
Total Bilirubin: 0.5 mg/dL (ref 0.0–1.2)
Total Protein: 7 g/dL (ref 6.5–8.1)

## 2023-10-27 MED ORDER — IOHEXOL 300 MG/ML  SOLN
75.0000 mL | Freq: Once | INTRAMUSCULAR | Status: AC | PRN
Start: 2023-10-27 — End: 2023-10-27
  Administered 2023-10-27: 75 mL via INTRAVENOUS

## 2023-10-27 MED ORDER — SODIUM CHLORIDE (PF) 0.9 % IJ SOLN
INTRAMUSCULAR | Status: AC
Start: 2023-10-27 — End: 2023-10-27
  Filled 2023-10-27: qty 50

## 2023-11-02 ENCOUNTER — Inpatient Hospital Stay: Payer: Medicare PPO | Admitting: Hematology and Oncology

## 2023-11-02 VITALS — BP 124/84 | HR 64 | Temp 97.9°F | Resp 18 | Ht 63.0 in | Wt 209.1 lb

## 2023-11-02 DIAGNOSIS — Z170 Estrogen receptor positive status [ER+]: Secondary | ICD-10-CM

## 2023-11-02 DIAGNOSIS — C50412 Malignant neoplasm of upper-outer quadrant of left female breast: Secondary | ICD-10-CM

## 2023-11-02 DIAGNOSIS — C3411 Malignant neoplasm of upper lobe, right bronchus or lung: Secondary | ICD-10-CM | POA: Diagnosis not present

## 2023-11-02 DIAGNOSIS — Z85118 Personal history of other malignant neoplasm of bronchus and lung: Secondary | ICD-10-CM | POA: Diagnosis not present

## 2023-11-02 DIAGNOSIS — C3490 Malignant neoplasm of unspecified part of unspecified bronchus or lung: Secondary | ICD-10-CM | POA: Diagnosis not present

## 2023-11-02 DIAGNOSIS — Z853 Personal history of malignant neoplasm of breast: Secondary | ICD-10-CM | POA: Diagnosis not present

## 2023-11-02 NOTE — Progress Notes (Signed)
 Patient Care Team: Seabron Lenis, MD as PCP - General (Family Medicine) Johnnye Ade, MD as Consulting Physician (Obstetrics and Gynecology) Odean Potts, MD as Consulting Physician (Hematology and Oncology) Izell Domino, MD as Attending Physician (Radiation Oncology) Ebbie Cough, MD as Consulting Physician (General Surgery)  DIAGNOSIS:  Encounter Diagnoses  Name Primary?   Malignant neoplasm of upper-outer quadrant of left breast in female, estrogen receptor positive (HCC) Yes   Malignant neoplasm of unspecified part of unspecified bronchus or lung (HCC)    Primary cancer of right upper lobe of lung (HCC)     SUMMARY OF ONCOLOGIC HISTORY: Oncology History  Malignant neoplasm of upper-outer quadrant of left breast in female, estrogen receptor positive (HCC)  12/02/2016 Initial Diagnosis   IDC grade 1 ER/PR pos, Her 2 Neg   12/19/2016 Surgery   Left Lumpectomy: Grade 1 IDC, 0.6 cm, Er 100%, PR 100%, Her 2 Neg, Ki 67: 3%, T1bN0 (stage 1A)   01/24/2017 - 02/20/2017 Radiation Therapy   Adjuvant radiation therapy   03/15/2017 Genetic Testing    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 This gene mutation increases your risk to develop melanoma and kidney cancer.  There were no genetic changes in the following genes:   ALK, APC, ATM, AXIN2,BAP1,  BARD1, BLM, BMPR1A, BRCA1, BRCA2, BRIP1, CASR, CDC73, CDH1, CDK4, CDKN1B, CDKN1C, CDKN2A (p14ARF), CDKN2A (p16INK4a), CEBPA, CHEK2, CTNNA1, DICER1, DIS3L2, EGFR (c.2369C>T, p.Thr790Met variant only), EPCAM (Deletion/duplication testing only), FH, FLCN, GATA2, GPC3, GREM1 (Promoter region deletion/duplication testing only), HOXB13 (c.251G>A, p.Gly84Glu), HRAS, KIT, MAX, MEN1, MET, MLH1, MSH2, MSH3, MSH6, MUTYH, NBN, NF1, NF2, NTHL1, PALB2, PDGFRA, PHOX2B, PMS2, POLD1, POLE, POT1, PRKAR1A, PTCH1, PTEN, RAD50, RAD51C, RAD51D, RB1, RECQL4, RET, RUNX1, SDHAF2, SDHA (sequence changes only), SDHB, SDHC, SDHD,  SMAD4, SMARCA4, SMARCB1, SMARCE1, STK11, SUFU, TERT, TERT, TMEM127, TP53, TSC1, TSC2, VHL, WRN and WT1.         03/2017 -  Anti-estrogen oral therapy   Letrozole  daily   Primary cancer of right upper lobe of lung (HCC)  10/18/2021 Initial Diagnosis   Right upper lobe lung wedge resection: Invasive well-differentiated adenocarcinoma with focal lipidic spread 1.2 cm, margins negative, T1b N0 M0 stage I A2   10/27/2021 Cancer Staging   Staging form: Lung, AJCC 8th Edition - Clinical: Stage IA2 (cT1b, cN0, cM0) - Signed by Odean Potts, MD on 10/27/2021 Stage prefix: Initial diagnosis Histologic grade (G): G1 Histologic grading system: 4 grade system     CHIEF COMPLIANT: Surveillance of lung and breast cancers  HISTORY OF PRESENT ILLNESS:   History of Present Illness Virginia Lindsey is a 73 year old female with a history of lung and breast cancer who presents for routine follow-up.  She underwent a wedge resection biopsy for lung cancer two years ago and continues with biannual CT scans, the latest showing no disease progression. Follow-up is scheduled for April. She has a history of breast cancer with prior breast surgery and experiences breast tenderness. Her mammograms are current, with the next due in March. She is concerned about her liver function tests, particularly a low AST level.     ALLERGIES:  is allergic to codeine, hydrocodone, and latex.  MEDICATIONS:  Current Outpatient Medications  Medication Sig Dispense Refill   aspirin  EC 81 MG tablet Take 81 mg by mouth in the morning. Swallow whole.     Biotin (NAIL-EX) 2.5 MG TABS      cholecalciferol (VITAMIN D3) 25 MCG (1000 UNIT) tablet Take 2 tablets (  2,000 Units total) by mouth daily.     Cyanocobalamin  (B-12) 1000 MCG TABS Take 1,000 mcg by mouth every other day.     No current facility-administered medications for this visit.    PHYSICAL EXAMINATION: ECOG PERFORMANCE STATUS: 1 - Symptomatic but completely  ambulatory  Vitals:   11/02/23 1148  BP: 124/84  Pulse: 64  Resp: 18  Temp: 97.9 F (36.6 C)  SpO2: 100%   Filed Weights   11/02/23 1148  Weight: 209 lb 1.6 oz (94.8 kg)      LABORATORY DATA:  I have reviewed the data as listed    Latest Ref Rng & Units 10/27/2023    8:47 AM 09/20/2022    5:38 AM 09/19/2022    6:20 AM  CMP  Glucose 70 - 99 mg/dL 82  99  86   BUN 8 - 23 mg/dL 17  5  15    Creatinine 0.44 - 1.00 mg/dL 9.06  9.27  9.08   Sodium 135 - 145 mmol/L 142  141  143   Potassium 3.5 - 5.1 mmol/L 3.7  3.5  3.2   Chloride 98 - 111 mmol/L 109  113  109   CO2 22 - 32 mmol/L 26  20  21    Calcium 8.9 - 10.3 mg/dL 9.0  8.1  9.0   Total Protein 6.5 - 8.1 g/dL 7.0   7.1   Total Bilirubin 0.0 - 1.2 mg/dL 0.5   0.7   Alkaline Phos 38 - 126 U/L 86   98   AST 15 - 41 U/L 14   16   ALT 0 - 44 U/L 9   15     Lab Results  Component Value Date   WBC 6.0 10/27/2023   HGB 14.3 10/27/2023   HCT 41.1 10/27/2023   MCV 92.2 10/27/2023   PLT 259 10/27/2023   NEUTROABS 3.3 10/27/2023    ASSESSMENT & PLAN:  Malignant neoplasm of upper-outer quadrant of left breast in female, estrogen receptor positive (HCC) 12/19/16: Left Lumpectomy: Grade 1 IDC, 0.6 cm, Er 100%, PR 100%, Her 2 Neg, Ki 67: 3%, T1bN0 (stage 1A) Adjuvant radiation therapy 01/24/2017-02/20/2017   Treatment plan: Adjuvant antiestrogen therapy with letrozole  2.5 mg daily started 03/03/2017 Letrozole  toxicities: Tolerating it extremely well   Breast cancer surveillance:  Mammogram 03/04/23: Benign fat necrosis left breast density category B  Primary cancer of right upper lobe of lung (HCC) 09/03/2021: PET CT scan: Posterior right upper lobe perifissural nodule stable without hypermetabolic activity.  No lymphadenopathy no metastatic disease. 10/18/2021: Right upper lobe lung wedge resection: Invasive well differentiated adenocarcinoma with focal lipidic spread 1.2 cm margins negative  T1b N0 M0 stage I A2   Patient did  not want to do lobectomy.  She is now on active surveillance CT chest 01/10/2022: No evidence of local recurrence.  Patchy groundglass micro nodularity suggestive of hypersensitivity pneumonitis CT chest 10/14/2022: Similar changes in the prior right upper lobe wedge resection without evidence of any recurrence.  No evidence of metastatic disease. Unchanged groundglass opacities CT 10/29/2023: Unchanged postoperative changes right upper lobe, scattered nonspecific ground glass nodularity right greater than left lung abscess unchanged   For lung cancer surveillance patient wishes to follow-up with Dr. Shelah Requested a new CT scan to be done in 6 months before her appointment with Dr. Shelah. Return to clinic to see me on an as-needed basis. Assessment & Plan Estrogen receptor positive left breast cancer, status post left mastectomy, under surveillance No  recurrence on imaging. Low breast density noted. - Continue regular mammograms, next due in March. - Encourage regular exercise post-cataract surgery. - Follow up with GYN for breast tenderness.  Primary right upper lobe lung cancer, status post wedge resection, under surveillance No recurrence on CT. Ground glass opacities stable. - Schedule CT scan in six months. - Follow up with Dr. Ruther in April for surveillance discussion.  Coronary atherosclerosis Coronary atherosclerosis present. - Continue monitoring cholesterol levels. - Maintain a healthy diet.      Orders Placed This Encounter  Procedures   CT Chest W Contrast    Standing Status:   Future    Expected Date:   05/02/2024    Expiration Date:   11/01/2024    If indicated for the ordered procedure, I authorize the administration of contrast media per Radiology protocol:   Yes    Does the patient have a contrast media/X-ray dye allergy?:   No    Preferred imaging location?:   [facility]    Release to patient:   Immediate [1]   The patient has a good understanding  of the overall plan. she agrees with it. she will call with any problems that may develop before the next visit here.  I personally spent a total of 30 minutes in the care of the patient today including preparing to see the patient, getting/reviewing separately obtained history, performing a medically appropriate exam/evaluation, counseling and educating, placing orders, referring and communicating with other health care professionals, documenting clinical information in the EHR, independently interpreting results, communicating results, and coordinating care.   Viinay K Lemon Sternberg, MD 11/02/23

## 2023-11-02 NOTE — Assessment & Plan Note (Signed)
 09/03/2021: PET CT scan: Posterior right upper lobe perifissural nodule stable without hypermetabolic activity.  No lymphadenopathy no metastatic disease. 10/18/2021: Right upper lobe lung wedge resection: Invasive well differentiated adenocarcinoma with focal lipidic spread 1.2 cm margins negative  T1b N0 M0 stage I A2   Patient did not want to do lobectomy.  She is now on active surveillance CT chest 01/10/2022: No evidence of local recurrence.  Patchy groundglass micro nodularity suggestive of hypersensitivity pneumonitis CT chest 10/14/2022: Similar changes in the prior right upper lobe wedge resection without evidence of any recurrence.  No evidence of metastatic disease. Unchanged groundglass opacities CT 10/29/2023: Unchanged postoperative changes right upper lobe, scattered nonspecific ground glass nodularity right greater than left lung abscess unchanged

## 2023-11-02 NOTE — Assessment & Plan Note (Signed)
 12/19/16: Left Lumpectomy: Grade 1 IDC, 0.6 cm, Er 100%, PR 100%, Her 2 Neg, Ki 67: 3%, T1bN0 (stage 1A) Adjuvant radiation therapy 01/24/2017-02/20/2017   Treatment plan: Adjuvant antiestrogen therapy with letrozole  2.5 mg daily started 03/03/2017 Letrozole  toxicities: Tolerating it extremely well   Breast cancer surveillance:  Mammogram 03/04/23: Benign fat necrosis left breast density category B

## 2023-11-03 ENCOUNTER — Telehealth: Payer: Self-pay | Admitting: Neurosurgery

## 2023-11-03 NOTE — Telephone Encounter (Signed)
 Pt is an former pt of [facility]. She called and requested an appt. Her appt is scheduled for November 7 at 8:40. She needs an appt for an MRI before her appt.

## 2023-11-07 NOTE — Telephone Encounter (Signed)
 Imaging needs to be determined by new provider. Dr. Rosslyn will evaluate when he establishes care with the patient.

## 2023-11-08 ENCOUNTER — Other Ambulatory Visit (HOSPITAL_COMMUNITY): Payer: Self-pay | Admitting: Radiology

## 2023-11-08 ENCOUNTER — Telehealth: Payer: Self-pay

## 2023-11-08 NOTE — Telephone Encounter (Signed)
 I spoke to the patient on the phone. I passed on that Dr. Rosslyn advises no further imaging at this time.   She got upset noting that she has been anxiously waiting until now from July to know if her aneurysm had healed.   We discussed that she can discuss further with Dr. Janjua about his reasoning for not getting new imaging at her appointment. She agrees and will see Dr. Janjua on Friday.

## 2023-11-08 NOTE — Progress Notes (Signed)
 Returned call to pt. Pt has been in touch with Dr. Catarino office. They will schedule her an appt to establish care there. JM

## 2023-11-10 ENCOUNTER — Encounter: Payer: Self-pay | Admitting: Neurosurgery

## 2023-11-10 ENCOUNTER — Ambulatory Visit: Admitting: Neurosurgery

## 2023-11-10 VITALS — BP 129/88 | HR 79 | Temp 98.6°F | Ht 63.0 in | Wt 209.6 lb

## 2023-11-10 DIAGNOSIS — Z95828 Presence of other vascular implants and grafts: Secondary | ICD-10-CM | POA: Diagnosis not present

## 2023-11-10 DIAGNOSIS — I671 Cerebral aneurysm, nonruptured: Secondary | ICD-10-CM | POA: Diagnosis not present
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
## 2023-11-13 DIAGNOSIS — H25043 Posterior subcapsular polar age-related cataract, bilateral: Secondary | ICD-10-CM | POA: Diagnosis not present

## 2023-11-13 DIAGNOSIS — H16223 Keratoconjunctivitis sicca, not specified as Sjogren's, bilateral: Secondary | ICD-10-CM | POA: Diagnosis not present

## 2023-11-13 DIAGNOSIS — H25041 Posterior subcapsular polar age-related cataract, right eye: Secondary | ICD-10-CM | POA: Diagnosis not present

## 2023-11-13 DIAGNOSIS — H5213 Myopia, bilateral: Secondary | ICD-10-CM | POA: Diagnosis not present

## 2023-11-13 DIAGNOSIS — H2511 Age-related nuclear cataract, right eye: Secondary | ICD-10-CM | POA: Diagnosis not present

## 2023-11-13 DIAGNOSIS — H2512 Age-related nuclear cataract, left eye: Secondary | ICD-10-CM | POA: Diagnosis not present

## 2023-11-13 DIAGNOSIS — H52223 Regular astigmatism, bilateral: Secondary | ICD-10-CM | POA: Diagnosis not present

## 2023-11-13 DIAGNOSIS — H5371 Glare sensitivity: Secondary | ICD-10-CM | POA: Diagnosis not present

## 2023-11-13 DIAGNOSIS — H25011 Cortical age-related cataract, right eye: Secondary | ICD-10-CM | POA: Diagnosis not present

## 2024-03-16 IMAGING — CT CT CHEST W/ CM
3 of 5 series · 15 of 36 positions shown, 17 images · IV contrast (APPLIED)
Comparison: CT and chest radiographs done on 03/03/2021

CLINICAL DATA: Lung nodules

EXAM:
CT CHEST WITH CONTRAST
TECHNIQUE: Multidetector CT imaging of the chest was performed during
intravenous contrast administration.

[Series 2: routine chest with · axial · 0.71mm/px · z∈[-203,-5]mm · 8 of 129 slices shown, 10 images]
[im 15/129  mediastinal]
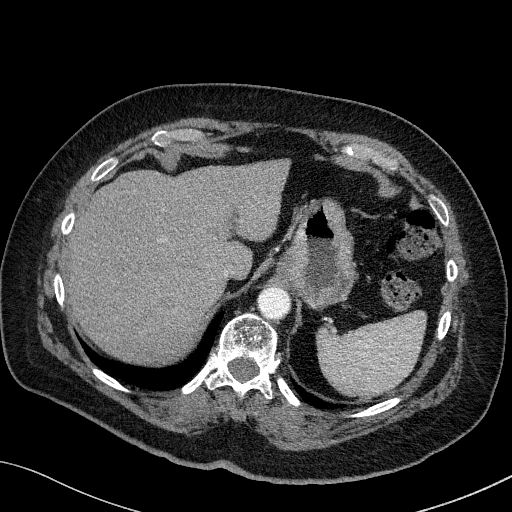
[im 15/129  lung]
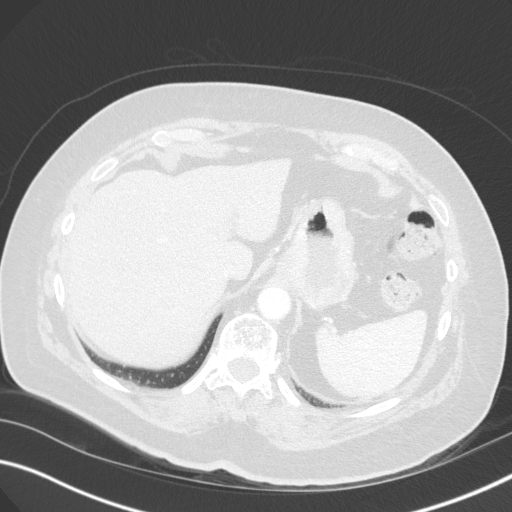
[im 29/129  lung]
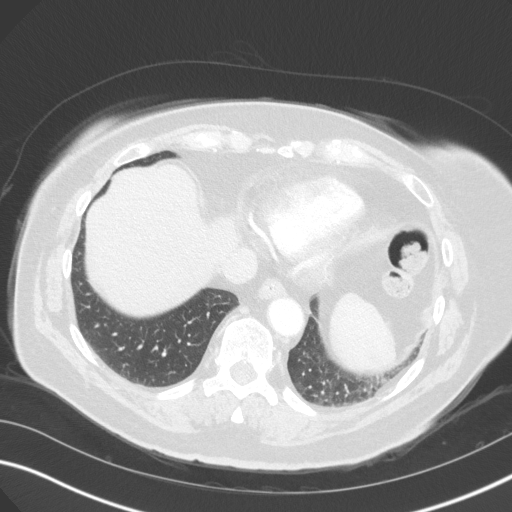
[im 43/129  lung]
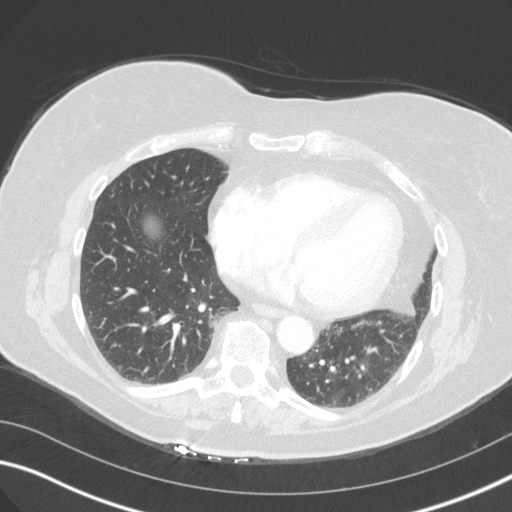
[im 57/129  lung]
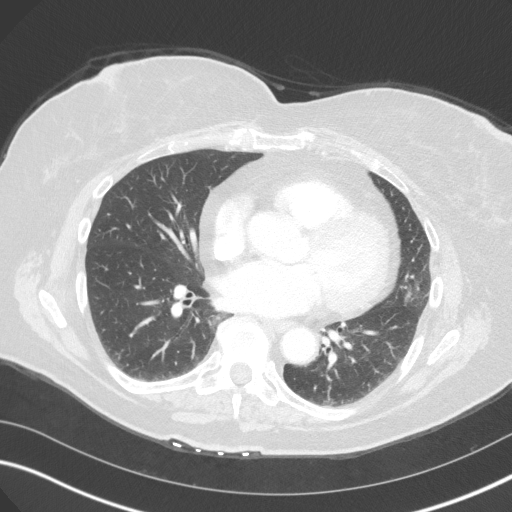
[im 72/129  mediastinal]
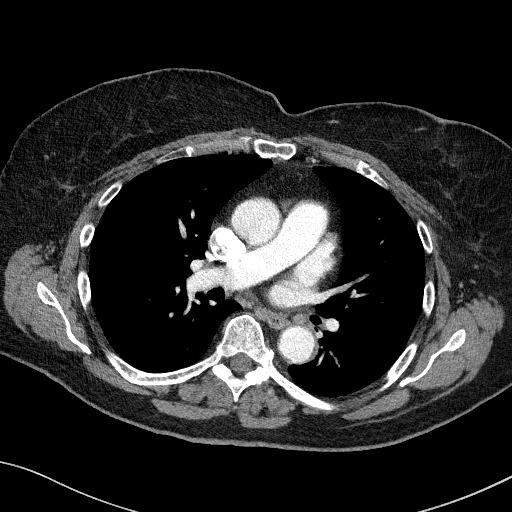
[im 72/129  lung]
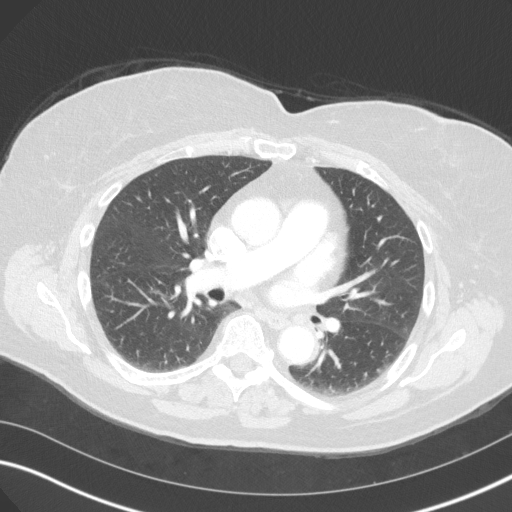
[im 86/129  lung]
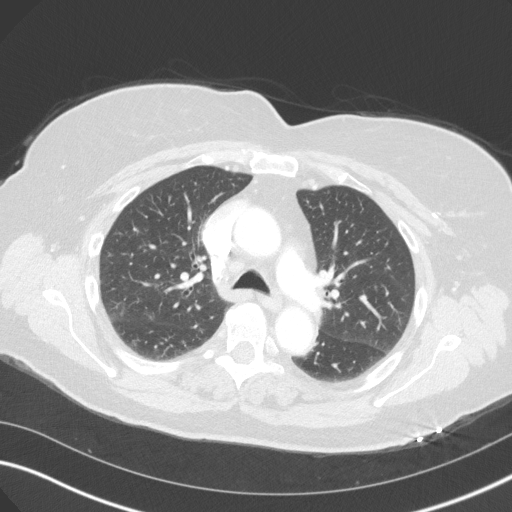
[im 100/129  lung]
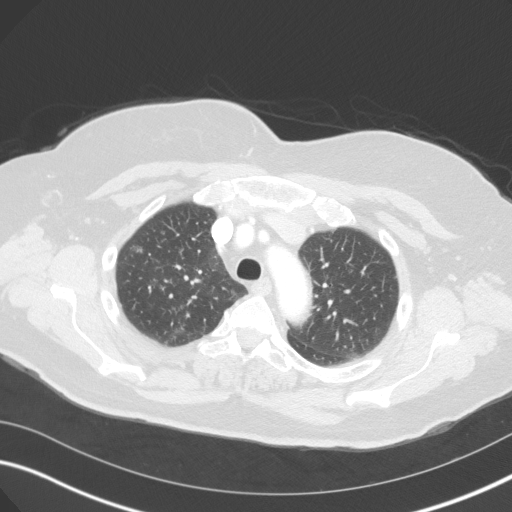
[im 114/129  lung]
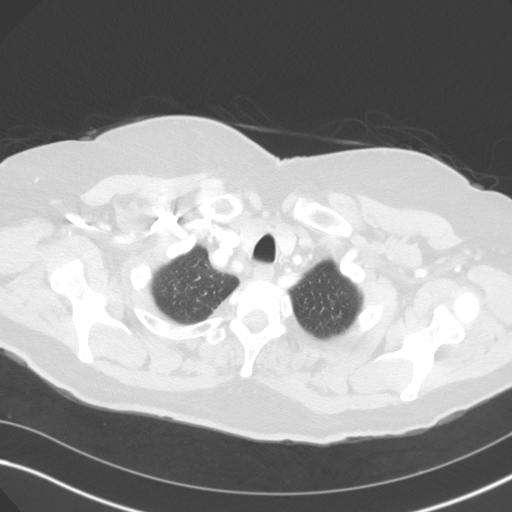

[Series 4: lungs · axial · 0.71mm/px · z∈[-199,-103]mm · 4 of 129 slices shown]
[im 17/129  lung]
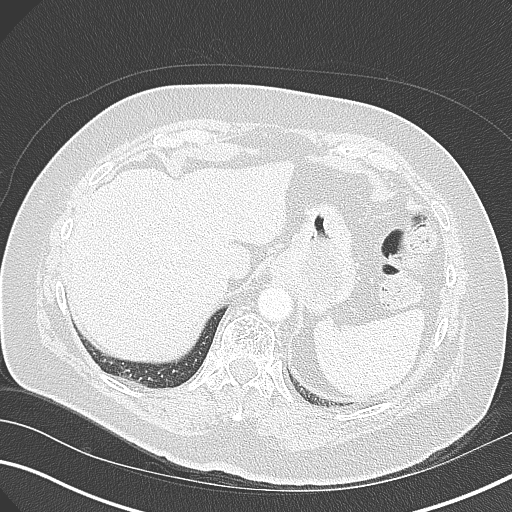
[im 33/129  lung]
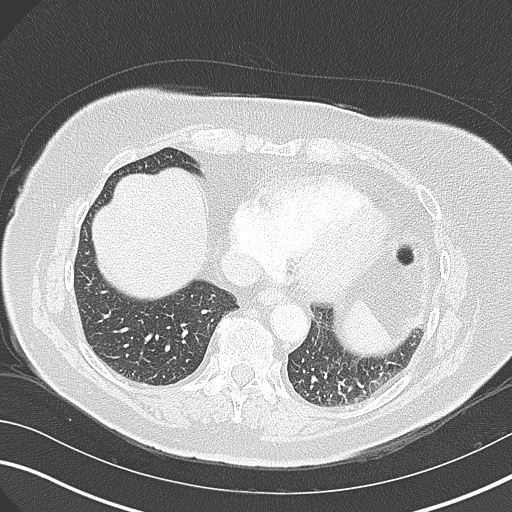
[im 49/129  lung]
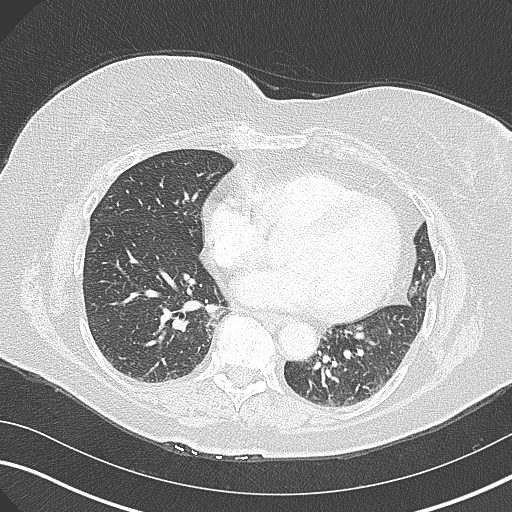
[im 65/129  lung]
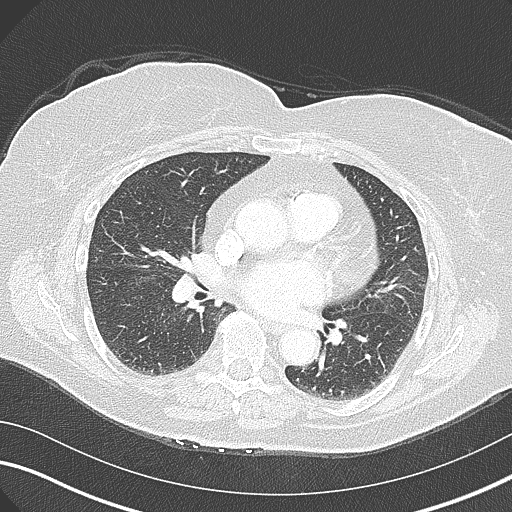

[Series 5: coronal · coronal · 0.54mm/px · 3 of 140 slices shown]
[im 28/140  lung]
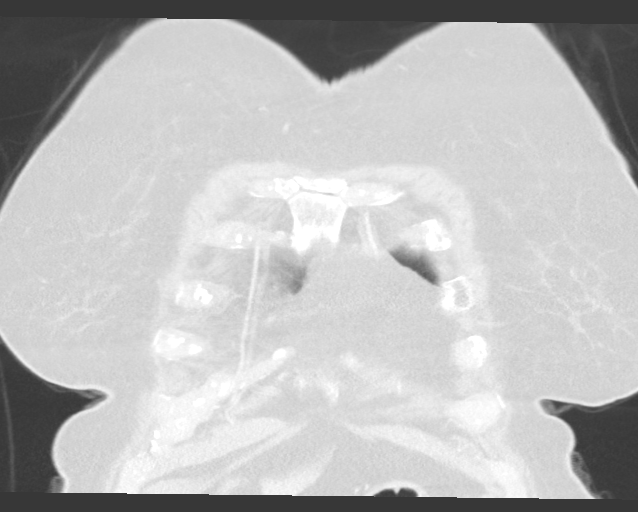
[im 56/140  lung]
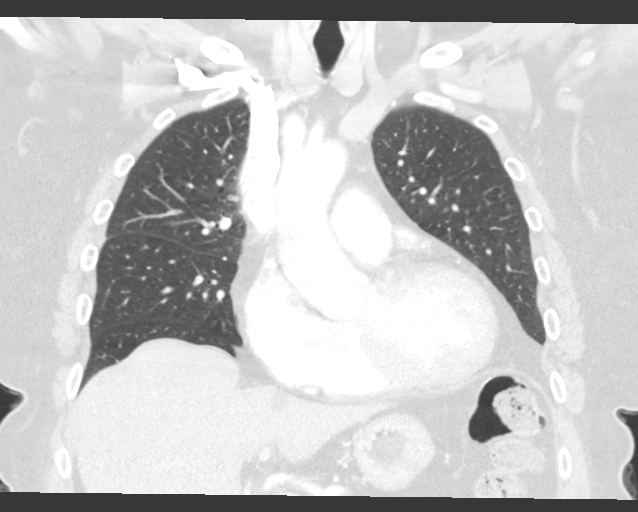
[im 84/140  lung]
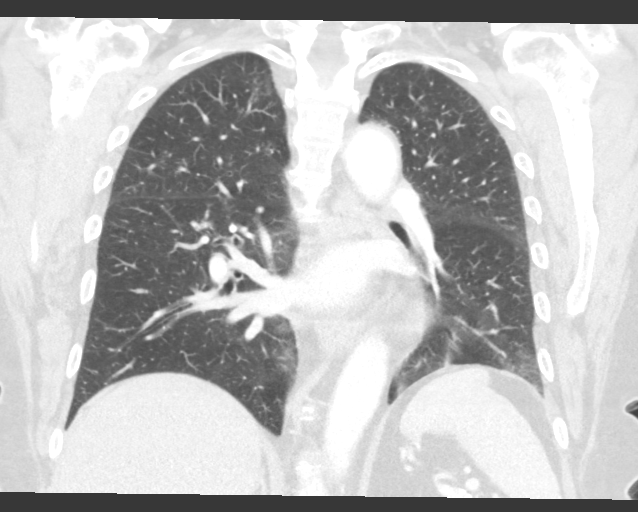

[15 of 36 positions shown; findings below may reference images not displayed]

RADIATION DOSE REDUCTION: This exam was performed according to the
departmental dose-optimization program which includes automated
exposure control, adjustment of the mA and/or kV according to
patient size and/or use of iterative reconstruction technique.

CONTRAST:  75mL OMNIPAQUE IOHEXOL 300 MG/ML  SOLN
FINDINGS: Cardiovascular: Scattered coronary artery calcifications are seen.
There is homogeneous enhancement in thoracic aorta. Left vertebral
artery is arising from the aortic arch. There are no intraluminal
filling defects in the central pulmonary artery branches.

Mediastinum/Nodes: Slightly enlarged lymph nodes in the mediastinum
appear less prominent.

Lungs/Pleura: In the image 37 of series 4, there is 13 x 6 mm nodule
in the right upper lobe in the anterior margin of major fissure.
This finding has not changed significantly. There is subtle increase
in interstitial markings in the lingula and left lower lobe,
possibly scarring or interstitial pneumonitis. There is interval
decrease in patchy ground-glass densities in both lungs since
03/03/2021. there is no pleural effusion or pneumothorax.

Upper Abdomen: Small hiatal hernia is seen. There is 10 mm
low-density structure in the left lobe of liver, possibly a cyst or
hemangioma with no significant interval change. Lipoma is noted in
the paracaval location at the level of right hemidiaphragm. Which
appears stable

Musculoskeletal: Possible hemangioma in the body of T11 vertebra as
not changed. Degenerative changes are noted in the visualized lower
cervical spine.
IMPRESSION: 13 x 6 mm noncalcified nodule in the right upper lobe inseparable
from the major fissure has not changed significantly since
03/03/2021. Consider one of the following in 3 months for both
low-risk and high-risk individuals: (a) repeat chest CT, (b)
follow-up PET-CT, or (c) tissue sampling. This recommendation
follows the consensus statement: Guidelines for Management of
Incidental Pulmonary Nodules Detected on CT Images: From the

There is interval clearing of patchy ground-glass infiltrates in
both lungs suggesting resolution of interstitial pneumonia. Minimal
residual scarring is seen in the lingula and left lower lobe. There
are no new focal infiltrates.

Other findings as described in the body of the report.

## 2024-07-04 ENCOUNTER — Other Ambulatory Visit

## 2024-07-09 ENCOUNTER — Ambulatory Visit: Admitting: Neurosurgery
